# Patient Record
Sex: Female | Born: 1935 | Race: Black or African American | Hispanic: No | Marital: Married | Smoking: Never smoker
Health system: Southern US, Community
[De-identification: ages and names within clinical notes are randomized; demographics above are authoritative.]

## PROBLEM LIST (undated history)

## (undated) DIAGNOSIS — I10 Essential (primary) hypertension: Secondary | ICD-10-CM

## (undated) DIAGNOSIS — R413 Other amnesia: Secondary | ICD-10-CM

## (undated) DIAGNOSIS — H269 Unspecified cataract: Secondary | ICD-10-CM

## (undated) DIAGNOSIS — I255 Ischemic cardiomyopathy: Secondary | ICD-10-CM

## (undated) DIAGNOSIS — E162 Hypoglycemia, unspecified: Secondary | ICD-10-CM

## (undated) DIAGNOSIS — I509 Heart failure, unspecified: Secondary | ICD-10-CM

## (undated) DIAGNOSIS — E785 Hyperlipidemia, unspecified: Secondary | ICD-10-CM

## (undated) DIAGNOSIS — I519 Heart disease, unspecified: Secondary | ICD-10-CM

## (undated) DIAGNOSIS — I2510 Atherosclerotic heart disease of native coronary artery without angina pectoris: Secondary | ICD-10-CM

## (undated) DIAGNOSIS — Z9114 Patient's other noncompliance with medication regimen: Secondary | ICD-10-CM

## (undated) DIAGNOSIS — Z91148 Patient's other noncompliance with medication regimen for other reason: Secondary | ICD-10-CM

## (undated) DIAGNOSIS — R42 Dizziness and giddiness: Secondary | ICD-10-CM

## (undated) HISTORY — PX: CHOLECYSTECTOMY: SHX55

## (undated) HISTORY — DX: Dizziness and giddiness: R42

## (undated) HISTORY — DX: Atherosclerotic heart disease of native coronary artery without angina pectoris: I25.10

## (undated) HISTORY — DX: Hyperlipidemia, unspecified: E78.5

## (undated) HISTORY — DX: Unspecified cataract: H26.9

## (undated) HISTORY — DX: Essential (primary) hypertension: I10

## (undated) HISTORY — DX: Heart disease, unspecified: I51.9

---

## 1998-01-07 ENCOUNTER — Ambulatory Visit (HOSPITAL_COMMUNITY): Admission: RE | Admit: 1998-01-07 | Discharge: 1998-01-07 | Payer: Self-pay | Admitting: Internal Medicine

## 1998-01-14 ENCOUNTER — Ambulatory Visit (HOSPITAL_COMMUNITY): Admission: RE | Admit: 1998-01-14 | Discharge: 1998-01-14 | Payer: Self-pay | Admitting: Internal Medicine

## 1998-01-20 ENCOUNTER — Ambulatory Visit (HOSPITAL_COMMUNITY): Admission: RE | Admit: 1998-01-20 | Discharge: 1998-01-20 | Payer: Self-pay | Admitting: Internal Medicine

## 1998-02-01 ENCOUNTER — Ambulatory Visit (HOSPITAL_COMMUNITY): Admission: RE | Admit: 1998-02-01 | Discharge: 1998-02-01 | Payer: Self-pay | Admitting: Urology

## 1998-02-01 ENCOUNTER — Encounter: Payer: Self-pay | Admitting: Urology

## 1998-02-28 ENCOUNTER — Observation Stay (HOSPITAL_COMMUNITY): Admission: RE | Admit: 1998-02-28 | Discharge: 1998-03-01 | Payer: Self-pay | Admitting: Urology

## 1998-02-28 ENCOUNTER — Encounter: Payer: Self-pay | Admitting: Urology

## 1998-04-11 ENCOUNTER — Encounter: Payer: Self-pay | Admitting: Urology

## 1998-04-11 ENCOUNTER — Ambulatory Visit (HOSPITAL_BASED_OUTPATIENT_CLINIC_OR_DEPARTMENT_OTHER): Admission: RE | Admit: 1998-04-11 | Discharge: 1998-04-11 | Payer: Self-pay | Admitting: Urology

## 2000-01-11 ENCOUNTER — Encounter: Payer: Self-pay | Admitting: Internal Medicine

## 2000-01-11 ENCOUNTER — Encounter: Admission: RE | Admit: 2000-01-11 | Discharge: 2000-01-11 | Payer: Self-pay | Admitting: Internal Medicine

## 2002-06-02 ENCOUNTER — Encounter: Admission: RE | Admit: 2002-06-02 | Discharge: 2002-06-02 | Payer: Self-pay | Admitting: Internal Medicine

## 2002-06-02 ENCOUNTER — Encounter: Payer: Self-pay | Admitting: Internal Medicine

## 2002-06-04 ENCOUNTER — Encounter: Payer: Self-pay | Admitting: Internal Medicine

## 2002-06-04 ENCOUNTER — Encounter: Admission: RE | Admit: 2002-06-04 | Discharge: 2002-06-04 | Payer: Self-pay | Admitting: Internal Medicine

## 2002-06-23 ENCOUNTER — Encounter: Admission: RE | Admit: 2002-06-23 | Discharge: 2002-09-21 | Payer: Self-pay | Admitting: Neurological Surgery

## 2002-11-11 ENCOUNTER — Encounter: Admission: RE | Admit: 2002-11-11 | Discharge: 2003-02-09 | Payer: Self-pay | Admitting: Internal Medicine

## 2003-05-29 HISTORY — PX: CORONARY ARTERY BYPASS GRAFT: SHX141

## 2004-03-20 ENCOUNTER — Encounter: Admission: RE | Admit: 2004-03-20 | Discharge: 2004-03-20 | Payer: Self-pay | Admitting: Internal Medicine

## 2004-05-03 ENCOUNTER — Inpatient Hospital Stay (HOSPITAL_COMMUNITY): Admission: AD | Admit: 2004-05-03 | Discharge: 2004-05-14 | Payer: Self-pay | Admitting: *Deleted

## 2004-05-04 ENCOUNTER — Encounter (INDEPENDENT_AMBULATORY_CARE_PROVIDER_SITE_OTHER): Payer: Self-pay | Admitting: *Deleted

## 2004-05-04 HISTORY — PX: TRANSTHORACIC ECHOCARDIOGRAM: SHX275

## 2004-06-26 ENCOUNTER — Encounter (HOSPITAL_COMMUNITY): Admission: RE | Admit: 2004-06-26 | Discharge: 2004-09-24 | Payer: Self-pay | Admitting: *Deleted

## 2004-09-25 ENCOUNTER — Encounter (HOSPITAL_COMMUNITY): Admission: RE | Admit: 2004-09-25 | Discharge: 2004-12-24 | Payer: Self-pay | Admitting: *Deleted

## 2005-11-20 ENCOUNTER — Encounter: Admission: RE | Admit: 2005-11-20 | Discharge: 2005-11-20 | Payer: Self-pay | Admitting: Internal Medicine

## 2006-05-28 HISTORY — PX: CARDIAC CATHETERIZATION: SHX172

## 2006-12-24 HISTORY — PX: CARDIOVASCULAR STRESS TEST: SHX262

## 2007-01-23 ENCOUNTER — Encounter: Admission: RE | Admit: 2007-01-23 | Discharge: 2007-01-23 | Payer: Self-pay | Admitting: Internal Medicine

## 2007-02-11 ENCOUNTER — Inpatient Hospital Stay (HOSPITAL_BASED_OUTPATIENT_CLINIC_OR_DEPARTMENT_OTHER): Admission: RE | Admit: 2007-02-11 | Discharge: 2007-02-11 | Payer: Self-pay | Admitting: *Deleted

## 2007-10-22 ENCOUNTER — Emergency Department (HOSPITAL_COMMUNITY): Admission: EM | Admit: 2007-10-22 | Discharge: 2007-10-22 | Payer: Self-pay | Admitting: Emergency Medicine

## 2010-04-11 ENCOUNTER — Ambulatory Visit: Payer: Self-pay | Admitting: Cardiology

## 2010-06-18 ENCOUNTER — Encounter: Payer: Self-pay | Admitting: Internal Medicine

## 2010-10-10 NOTE — Cardiovascular Report (Signed)
NAME:  Mckenzie Williamson, Mckenzie Williamson             ACCOUNT NO.:  [id number]   MEDICAL RECORD NO.:  [id number]          PATIENT TYPE:  OIB   LOCATION:  1962                         FACILITY:  [facility]   PHYSICIAN:  Elmore Guise., M.D.DATE OF BIRTH:  Nov 10, 1932   DATE OF PROCEDURE:  DATE OF DISCHARGE:                            CARDIAC CATHETERIZATION   INDICATIONS FOR PROCEDURE:  Chest pain with abnormal stress test.   DESCRIPTION OF PROCEDURE:  The patient was brought to the cardiac cath  lab after appropriate informed consent.  She was prepped and draped in  sterile fashion.  Approximately 15 mL of 1% lidocaine was used for local  anesthesia.  A 4-French sheath was placed in the right femoral artery  without difficulty.  Coronary angiography, aortic root angiography, LV  angiography, LIMA angiography was then performed.  The patient tolerated  procedure well and was transferred from the cardiac cath lab in stable  condition.   FINDINGS:  1. Left Main:  Normal.  No significant disease.  2. LAD:  Ostial 70% stenosis followed by mid 60-70% stenosis followed      by competitive flow in the mid and distal LAD from LIMA graft.  3. D1:  Small vessel with ostial/proximal 80-90% stenosis and mid      distal moderate luminal irregularities.  4. LCX:  Patent with moderate proximal luminal irregularities,      subtotal occlusion after a second OM was noted.  OM-1:  High      branching small vessel with proximal 90% stenosis.  OM-2:  Moderate-      sized vessel with 60% ostial proximal stenosis followed by moderate      mid and distal luminal irregularities.  5. OM III:  Fills faintly via left-to-left collaterals as well as      distal LCX filling faintly via left-to-left wrap collaterals.  6. Ramus intermedius:  Small vessel with diffuse proximal disease.  7. RCA:  Dominant with proximal 60% stenosis followed by a subtotal      mid obstruction, distal and mid vessel fills via right to right  collaterals as well as left to right collaterals.  8. Vein graft to the OM system is include occluded.  9. Vein graft to the distal RCA is patent occluded.  10.LIMA to the LAD was selectively engaged and patent without      significant disease in the body and touchdown of the LIMA.  There      is back flow noted filling the proximal LAD.  The distal LAD has an      80% stenosis prior to bifurcation into a fish tail.  However, very      small vessel with TIMI III flow was noted.  11.LV:  EF is 50-55%.  LVEDP is 16 mmHg.  No significant wall motion      abnormalities.  12.Aortic root angiogram showed mild proximal ectasia of the proximal      ascending aorta (no dissection) with no patent graft vein grafts      noted.   IMPRESSION:  1. Multivessel coronary artery disease as discussed above.  2. Occluded  vein graft to the OM and occluded vein graft to the RCA.  3. Patent LIMA to LAD.  4. Normal LV systolic function with an EF of 50-55%.  5. Mild proximal aortic root ectasia with no evidence of dissection      and no patent vein graft visualized.   PLAN:  At this time, I would recommend aggressive maximal medical  therapy.  The patient will have Ranexa and Plavix added to her current  regimen of beta blocker, ACE inhibitor, statin and nitrates.      Elmore Guise., M.D.  Electronically Signed     TWK/MEDQ  D:  02/11/2007  T:  02/11/2007  Job:  [id number]

## 2010-10-13 NOTE — H&P (Signed)
NAME:  Mckenzie Williamson, Mckenzie Williamson NO.:  [id number]   MEDICAL RECORD NO.:  [id number]          PATIENT TYPE:  INP   LOCATION:                               FACILITY:  [facility]   PHYSICIAN:  Elmore Guise., M.D.DATE OF BIRTH:  [birth year]   DATE OF ADMISSION:  05/03/2004  DATE OF DISCHARGE:                                HISTORY & PHYSICAL   PRIMARY CARE PHYSICIAN:  Dr. Wilson Singer.   INDICATION FOR ADMISSION:  Unstable angina.   HISTORY OF PRESENT ILLNESS:  The patient is a very pleasant 77 year old  African American female with past medical history of hypertension, diabetes,  dyslipidemia, who presents for evaluation of increasing substernal chest  pressure.  The patient reports a 100-month history of substernal chest pain  off and on, however, noticed increasing intensity as well as increasing  frequency over the last couple of weeks.  She had a recurrent bout last  evening starting with any exertional activity.  She states those are  associated with profound sweating and shortness of breath.  Last evening's  episode, she took 2 aspirin.  Chest pain continued for approximately  ______________  lay down with resolution of her symptoms.  She had a  recurrent episode this morning, went to her primary care physician's office  and is sent here for further evaluation.  Otherwise, reports occasional  chills, occasional right leg tingling, otherwise, no significant complaints.  Reports her weight is stable.  Denies any orthopnea, PND or lower extremity  edema.  Does have occasional palpitations primarily associated with her  chest discomfort.  ____________ and working out at [employer], however, stopped  approximately 2 months ago.  All other review of systems are negative.   CURRENT MEDICATIONS:  1.  Aspirin.  2.  Accupril 20 mg daily.   ALLERGIES:  None.   FAMILY HISTORY:  Family history is positive for TB in her mother, lung  cancer in her father, heart attack in her  brother at age 68.   SOCIAL HISTORY:  She is married, has 3 grown children, all healthy.  She is  a Merchandiser, retail at the [employer], now retired.  No tobacco,  occasional alcohol, drinks approximately 1 caffeinated beverage daily.   PAST SURGICAL HISTORY:  Past surgical history includes cholecystectomy.   PHYSICAL EXAMINATION:  VITAL SIGNS:  On exam today, blood pressure is  128/80, heart rate is 104 and regular, weight is 203 pounds.  GENERAL:  In general, she is a very pleasant African American female, alert  and oriented x4, in no acute distress.  NECK:  Neck is supple with no lymphadenopathy, 2+ carotids, no bruits.  LUNGS:  Lungs are clear with decreased breath sounds in the bases.  HEART:  Heart is regular with a normal S1 and S2, a very soft 2/6 systolic  ejection murmur heard at the left lower sternal border.  ABDOMEN:  Abdomen is obese, soft, nontender and non-distended.  EXTREMITIES:  Extremities are warm with trace pedal pulses and 1+ femoral  pulses, no bruit noted.   LABORATORY AND ACCESSORY CLINICAL DATA:  ECG  today shows sinus tachycardia,  nonspecific T wave flattening in the lateral precordial leads, small Q waves  noted inferiorly.   IMPRESSION:  1.  Unstable angina.  2.  Diabetes mellitus.  3.  History of hypertension and dyslipidemia.   PLAN:  1.  Will admit to telemetry.  2.  Start anticoagulation with Lovenox 1 mg/kg b.i.d.  3.  Continue aspirin.  4.  Will start low-dose Lopressor 12.5 mg b.i.d. as well as nitroglycerin      with nitro paste 1/2 ____________.  5.  Will obtain lab work including CBC, CMP, cardiac enzymes, chest x-ray      for further evaluation.  Will obtain fasting lipid panel in the morning.  6.  Discussed invasive workup including cardiac catheterization and the      patient agrees to proceed, catheterization tentatively scheduled for      May 04, 2004 at 7:30.  7.  Further treatment pending her blood work  results.        ___________________________________________  Elmore Guise., M.D.    TWK/MEDQ  D:  05/03/2004  T:  05/03/2004  Job:  [id number]

## 2010-10-13 NOTE — Consult Note (Signed)
NAME:  Mckenzie Williamson, Mckenzie Williamson NO.:  [id number]   MEDICAL RECORD NO.:  [id number]          PATIENT TYPE:  INP   LOCATION:  3707                         FACILITY:  [facility]   PHYSICIAN:  Kathlee Nations Trigt III, M.D.DATE OF BIRTH:  1932-09-02   DATE OF PROCEDURE:  05/04/2004  DATE OF DISCHARGE:                      STAT - MUST CHANGE TO CORRECT WORK TYPE   REASON FOR CONSULTATION:  Severe three vessel coronary artery disease,  unstable angina.   CHIEF COMPLAINT:  Chest pain and shortness of breath.   HISTORY OF PRESENT ILLNESS:  I was asked to evaluate this 77 year old obese  diabetic female for potential surgical coronary revascularization for  recently diagnosed severe three vessel coronary artery disease.  She has had  an eight week history of exertional substernal chest pain of increasing  intensity and increasing frequency over the past several days.  On the  evening prior to admission, she had some nocturnal angina without preceding  activity.  This was associated with diaphoresis, shortness of breath, and  some nausea.  She took some aspirin and rested and the pain resolved.  However, the next morning, she had recurrent, similar chest pain and for  that reason, she was referred for admission and cardiology evaluation.  The  patient had cardiac enzymes which were negative and nonspecific ST segment  changes on her EKG.  Diagnostic cardiac catheterization was done today which  demonstrated severe three vessel disease with 80-90% stenosis of the LAD,  80% stenosis of the circumflex, and diffuse 70-80% stenosis of the right  coronary.  Ejection fraction was globally decreased at approximately 40% and  left ventricular end diastolic pressure was 18 mmHg.  A 2D echo was  performed which showed no significant mitral regurgitation and ejection  fraction of 40% with left ventricular hypertrophy consistent with her  clinical history of hypertension.  Because of her coronary anatomy  and  symptoms of unstable angina, she was felt to be a candidate for surgical  revascularization.   PAST MEDICAL HISTORY:  1.  Diabetes mellitus.  2.  Hypertension.  3.  Dyslipidemia.  4.  Obesity.  5.  Status post cholecystectomy.  6.  No known drug allergies.   HOME MEDICATIONS:  Lantus insulin 70 units q.h.s., Accupril 20 mg p.o.  daily, aspirin 325 mg daily, Lopressor 25 mg b.i.d. and p.r.n.  nitroglycerin.   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  She  is married and has grown children.  She does not use alcohol or tobacco.   FAMILY HISTORY:  Positive for coronary artery disease in that both a sister  and brother have had heart disease or MI.  Her father had lung cancer and  her mother had tuberculosis.   REVIEW OF SYMPTOMS:  General review is negative for weight loss of fever.  HEENT:  Negative for swallowing difficulty or active dental complaints.  Thoracic review is negative for thoracic trauma, productive cough, or  history of abnormal chest x-ray.  Cardiac review is positive for unstable  angina, negative for prior cardiac murmur.  GI review is positive for  previous cholecystectomy,  no recent change in bowel habits or blood per  rectum.  Urological review is negative for kidney stones or voiding  difficulty.  Vascular review is negative for claudication, TIA, or DVT.  Endocrine review is positive for diabetes, negative for thyroid disease.  Hematologic review is negative for bleeding disorder or prior blood  transfusions.  Neurological review is negative for stroke or seizure.   PHYSICAL EXAMINATION:  VITAL SIGNS:  The patient is 5 feet 2 inches, weight 204 pounds, blood  pressure 130/84, heart rate 90, respirations 18, saturations 95%.  GENERAL:  Short, obese white female in her hospital room following cardiac  catheterization in no acute distress.  HEENT:  Normocephalic, full EOMs, pharynx clear.  NECK:  Supple without JVD, mass,  or carotid bruits.  LYMPHATICS:  Reveal no palpable supraclavicular adenopathy.  LUNGS:  Clear with somewhat distant breath sounds bilaterally.  CARDIAC EXAM:  Regular rhythm without S3 gallop or S4 heart sound.  She had  a soft 2/6 holosystolic murmur.  ABDOMEN:  Obese, nontender, without organomegaly.  EXTREMITIES:  No clubbing, edema, or cyanosis.  VASCULAR EXAM:  Nonpalpable pedal pulses, 2+ femoral and radial pulses  bilaterally, there is no evidence of venous insufficiency of the lower  extremities.  SKIN:  Without rash or lesions.  NEUROLOGIC:  Alert and oriented x 3 without focal motor deficit.   IMPRESSION:  I reviewed the coronary arteriograms and 2D echo and she has  severe diabetic three vessel disease with moderately reduced LV function and  significant left ventricular  hypertrophy.  There is no evidence of  significant mitral regurgitation or aortic insufficiency.   PLAN:  Surgical revascularization would be recommended to this patient for  her best long term therapy of her three vessel coronary disease.  She would  benefit from bypass grafts to the LAD, circumflex marginal, and posterior  descending artery.  I have discussed the plan for surgery and she is in  agreement at this time.  We will proceed with preparations and schedule the  surgery for Monday, December 12.  Thank you for the consultation.      Mckenzie Williamson   PV/MEDQ  D:  05/04/2004  T:  05/04/2004  Job:  [id number]   cc:   Elmore Guise., M.D.  [address]  [place], [place] [address]  Fax: [phone]   Wilson Singer, M.D.  [address]  [place]  [place] [address]  Fax: [phone]

## 2010-10-13 NOTE — Discharge Summary (Signed)
NAME:  Mckenzie Williamson, YOUSE NO.:  [id number]   MEDICAL RECORD NO.:  [id number]          PATIENT TYPE:  INP   LOCATION:  2014                         FACILITY:  [facility]   PHYSICIAN:  [name], M.D.  DATE OF BIRTH:  [birth year]   DATE OF ADMISSION:  05/03/2004  DATE OF DISCHARGE:  05/14/2004                                 DISCHARGE SUMMARY   ADMISSION DIAGNOSIS:  Chest pain.   ADDITIONAL/DISCHARGE DIAGNOSES:  1.  Severe three-vessel coronary artery disease and class 4 unstable angina,      status post coronary artery bypass graft surgery x3, completed on      May 08, 2004.  2.  Left ventricular dysfunction with an ejection fraction of 40%.  3.  Diabetes mellitus.  4.  Hypertension.  5.  Dyslipidemia.  6.  Obesity.  7.  History of a cholecystectomy.   HOSPITAL MANAGEMENT/PROCEDURES:  1.  A cardiac catheterization on May 04, 2004.  This revealed severe      three-vessel coronary artery disease with a mild left ventricular      dysfunction with an estimated ejection fraction of 40%.  2.  Preliminary arterial evaluation for planned cardiac surgery including      bilateral carotid duplex examination.  This revealed no significant      internal carotid artery stenosis bilaterally.  3.  Coronary artery bypass graft surgery x3 completed on May 08, 2004.  4.  Initiation of cardiac rehabilitation phase I.  5.  Postoperative blood transfusion on May 09, 2004.  The patient had      no adverse reaction.   CONSULTATIONS:  1.  Cardiac rehabilitation.  2.  Case management.   HISTORY OF PRESENT ILLNESS:  The patient is a pleasant 77 year old obese,  diabetic, hypertensive black female, who presented to [facility] on May 03, 2004, with unstable angina.  The patient had cardiac  enzymes which were negative, and nonspecific ST-segment changes on her  electrocardiogram.  A decision was made for her to be admitted for further  evaluation.   HOSPITAL COURSE:  The patient was admitted to [facility]  on May 03, 2004.  She was taken to the cardiac catheterization and  underwent a coronary arteriography.  This revealed severe three-vessel  coronary artery disease with 80%-90% stenosis of the LAD, 80% stenosis of  the circumflex, and diffuse 70%-80% stenosis of the right coronary artery.  The patient's ejection fraction was globally decreased at approximately 40%,  and left ventricular end diastolic pressure was 18 mmHg.  A 2-D  echocardiogram was performed which showed no significant mitral  regurgitation, and an ejection fraction of 40%.  Because of her coronary  anatomy and symptoms of unstable angina, the patient was felt to be a  candidate for surgical revascularization.  Dr. Kathlee Nations Trigt of [facility] responded to the surgical consultation request  on May 04, 2004.  His impression was that indeed the patient would  benefit from a coronary artery bypass grafting for her best long-term  therapy of her three-vessel coronary artery disease.  He discussed the plan  for surgery, as well as the risks, benefits and alternatives to the  procedure with the patient and with her family at that time.  The patient  was in understanding, and agreed to proceed with surgery.  The plan was made  for preparations and the surgery was scheduled for Monday, May 08, 2004.  In the meantime, the patient remained hemodynamically stable and free of  chest pain.  She remained on aspirin, ACE inhibitor, Lopressor and heparin.  Preliminary arterial evaluation was completed, and showed no evidence of  significant internal carotid artery stenosis.  The patient was taken to the operating room on May 08, 2004, and  underwent a coronary artery bypass grafting x3 utilizing the left internal  mammary artery to the LAD, saphenous vein graft to the obtuse marginal,  saphenous vein graft to the posterior descending  coronary artery.  The  greater saphenous vein was harvested using an open technique from the right  thigh, although this was unsuitable for a conduit.  The left lower leg vein  was exposed, but was too small to use for a conduit as well.  Therefore,  cryo-vein was used for the lesser conduit.  Overall, the patient tolerated  this procedure well and was transferred to the surgical intensive care unit  in critical but stable condition.  Postoperatively the patient was extubated without difficulty on  postoperatively day number one.  She awoke from anesthesia neurologically  intact.  She remained on a low-dose Neo-Synephrine drip for pressure  support.  The patient was initiated on Plavix therapy for coverage of the  cryo-vein conduit.  The patient continued to progress through her  postoperative course without any significant complications.  Her invasive  lines and chest tubes were discontinued while she was in the intensive care  unit.  She was deemed appropriate for transfer to the step-down unit on  postoperatively day number two, or May 11, 2004.  While on the step-down unit, the patient continued to do quite well.  She  did have minimal serosanguineous drainage from the proximal incision on her  right thigh.  The patient was initiated on oral antibiotic therapy for  coverage.  Otherwise, the patient was deemed appropriate for the initiation  of discharge planning on May 13, 2004, or postoperative day number  four.   CONDITION ON DISCHARGE:  At the time of discharge planning, the patient was  doing quite well.  She was ambulating in the hallways with cardiac rehab  utilizing a rolling walker.  Her vital signs were as follows:  She remained  afebrile, blood pressure 140/70, pulse 86 and regular, respirations 20 and  unlabored.  SAO2 was 94%-99% on room air.  The patient's weight was at 206  pounds with a preoperative weight of 202 pounds.  The  24-hour bedside glucose  measurements were as follows:  71, 195, 95 and 122.  On her physical examination her heart was in a regular rate and rhythm with  a normal sinus rhythm on telemetry.  Her lungs were clear to auscultation.  Her abdomen was soft, nontender, nondistended, with good bowel sounds.  Her  extremities had 1+ ankle edema bilaterally.  Her incisions showed mild  serosanguineous drainage from the proximal right thigh incision, otherwise  were healing well.   DISPOSITION:  We will continue as planned with discharge in the a.m. of  May 14, 2004, pending the a.m. rounds and no change in the patient's  clinical status.  The patient is being discharged  to home in an improved and  stable condition.   DISCHARGE MEDICATIONS:  1.  Aspirin 81 mg p.o. daily.  2.  Lopressor 25 mg p.o. b.i.d.  3.  Accupril 20 mg p.o. daily.  4.  Lipitor 20 mg p.o. daily.  5.  Plavix 75 mg p.o. daily.  6.  Lantus insulin 40 units subcutaneously q.h.s.  7.  Keflex 500 mg p.o. q.6h. for seven more days.  8.  Multivitamin p.o. daily.  9.  Lasix 40 mg p.o. daily for seven more days.  10. K-Dur 20 mEq p.o. daily for seven more days.  11. Ultram 50 mg, one to two tab p.o. q.4-6h. p.r.n. pain.   DISCHARGE INSTRUCTIONS:  1.  Activity:  The patient is to avoid driving.  She should avoid heavy      lifting or strenuous activity.  She is to continue to walk daily.  She      should continue her breathing exercises for one more week.  2.  Diet:  The patient is to follow a low-fat, low-salt, carbohydrate-      modified medium calorie diet.  3.  Wound care:  The patient may shower.  She should wash her incisions      daily with soap and water.  She should notify the [facility] if she has      any increased redness, swelling, or drainage from her incisions, or if      she has a fever of greater than 101.0 degrees.  4.  Special instructions:  The patient will have a home health registered      nurse, as well as a physical therapist  visit her per cardiac surgery      protocol.  This is arranged by [facility].   FOLLOWUP:  1.  The patient is scheduled for staple removal at the [facility] on      May 23, 2004, at 10 .m.  2.  The patient should schedule an appointment to see Dr. Rosine Abe      within two weeks of discharge.  The patient is to call #[phone] to      make this appointment date and time.  3.  The patient is scheduled to see Dr. Kathlee Nations Trigt on Friday, June 30, 2004, at 10:15 a.m.      Eber Jones   CAF/MEDQ  D:  05/13/2004  T:  05/15/2004  Job:  [id number]   cc:   Dr. Domingo Pulse., M.D.  [address]  [place], [place] [address]  Fax: [phone]

## 2010-10-13 NOTE — Cardiovascular Report (Signed)
NAME:  Mckenzie Williamson, Mckenzie Williamson NO.:  [id number]   MEDICAL RECORD NO.:  [id number]          PATIENT TYPE:  INP   LOCATION:  3707                         FACILITY:  [facility]   PHYSICIAN:  Elmore Guise., M.D.DATE OF BIRTH:  21-Jan-1933   DATE OF PROCEDURE:  05/04/2004  DATE OF DISCHARGE:                              CARDIAC CATHETERIZATION   INDICATIONS FOR PROCEDURE:  Unstable angina.   BRIEF HISTORY:  A 77 year old African-American female with a past medical  history of diabetes mellitus, dyslipidemia and hypertension presented with  two-month history of progressive angina.   PROCEDURE:  Cardiac catheterization, coronary angiogram, LV angiogram.   PROCEDURE DESCRIPTION:  The patient was brought to the Cardiac Cath Lab  after appropriate informed consent.  She was prepped and draped in a sterile  fashion.  A 6 French sheath was placed in the right femoral artery without  difficulty, a 5 French sheath was placed in the right femoral vein.  Angiography was then performed on the left and right coronary systems.  This  was followed by LV angiography.  The patient tolerated the procedure well  and was transferred from the Cardiac Cath Lab in stable condition.   RESULTS:  1.  Left main:  Mild distal narrowing.  2.  LAD:  Ostial 70-80% stenosis with mid 40-50% stenosis and distal 50%      stenosis.  LAD giving left-to-right collaterals to distal RCA and PLV      branch.  Small branching diagonals were noted with moderate disease.  3.  LCX:  Mid 40-50% stenosis with large branching OM prior to a 100%      obstruction.  4.  RCA:  Dominant with diffuse disease, subtotal mid obstruction, there are      faint right-to-right collaterals as well as left-to-right collaterals      still in the posterolateral branch.  5.  LVEFe50% with mild diaphragmatic hypokinesis, LVEDP was 18 mmHg.   IMPRESSION:  1.  Severe obstructive three-vessel disease.  2.  Preserved left ventricular  systolic function with mild diaphragmatic      hypokinesis and LVEDP of 18 mmHg.   PLAN:  Surgical consult for revascularization.       TWK/MEDQ  D:  05/04/2004  T:  05/04/2004  Job:  [id number]

## 2010-10-13 NOTE — Op Note (Signed)
NAME:  Mckenzie Williamson, Mckenzie Williamson             ACCOUNT NO.:  [id number]   MEDICAL RECORD NO.:  [id number]          PATIENT TYPE:  INP   LOCATION:  2308                         FACILITY:  [facility]   PHYSICIAN:  Kathlee Nations Trigt III, M.D.DATE OF BIRTH:  [birth year]   DATE OF PROCEDURE:  05/08/2004  DATE OF DISCHARGE:                                 OPERATIVE REPORT   OPERATION:  Coronary artery bypass grafting x3 (left internal mammary artery  to left anterior descending artery, saphenous vein graft to obtuse marginal,  saphenous vein graft to posterior descending).   PREOPERATIVE DIAGNOSIS:  Class 4 unstable angina with severe two-vessel  coronary artery disease, inferior wall hypokinesia.   POSTOPERATIVE DIAGNOSIS:  Class 4 unstable angina with severe two-vessel  coronary artery disease, inferior wall hypokinesia.   SURGEON:  [name], M.D.   ASSESSMENT:  [name], P.A.-C.   ANESTHESIA:  General.   INDICATIONS:  The patient is a 77 year old obese diabetic, hypertensive  black female who presented with unstable angina.  She ruled out for MI and  cardiac catheterization by Dr. [name] demonstrated severe three-vessel  coronary disease with inferior wall hypokinesia.  She is felt to be a  candidate for surgical revascularization based on the diabetic pattern of  her coronary artery disease, which is not felt to be amenable to  percutaneous intervention.   Prior to surgery, I examined the patient in her hospital room and reviewed  the results of the cardiac catheterization with the patient and her family.  I discussed the indications and expected benefits of coronary artery bypass  surgery for treatment of her coronary artery disease.  I discussed with them  the major aspects of the planned procedure, including the choice of conduit  for grafts, the use of general anesthesia and cardiopulmonary bypass, the  location of the surgical incisions, and the expected postoperative  hospital  recovery period.  I discussed with the patient the risks to her of coronary  artery bypass surgery, including the risks of MI, CVA, bleeding, blood  transfusion requirement, infection, and death.  She understood these  implications for the surgery and agreed to proceed with the operation as  planned under what I felt was an informed consent.   OPERATIVE FINDINGS:  The patient's saphenous vein was of very poor quality  and inadequate for use in either leg.  Both legs were explored, and an open  harvest of the right thigh vein was performed to only find this heavily  scarred, small, sclerotic and not usable with evidence of probable prior  phlebitis.  For that reason, cryopreserved human saphenous vein was used,  and the blood group of the donor vein was the same blood group of the  patient recipient.  The mammary artery was a good conduit, approximately 1.5  mm in diameter.  The patient received 3 units of packed cells in the  operating room for preoperative anemia.   PROCEDURE:  The patient was brought to the operating room and placed supine  on the operating room table, where general anesthesia was introduced under  invasive hemodynamic monitoring.  The  chest, abdomen, and legs were prepped  with Betadine and draped as a sterile field.  The incisions were first made  in both legs to perform endoscopic harvest; however, no good vein or  adequate vein could be identified through the small incisions.  An  additional incision was made in each leg; however, no vein was identified.  For that reason, a right groin incision was made, and the greater saphenous  vein and the accessory saphenous vein were identified and dissected out of  her extremely deep thighs with several centimeters of subcutaneous fat.  The  vein was filed down to the knee, where it became too small to use and was  divided.  The vein was then cannulated and flushed and found to be scarred,  thickened, nonpatent in  some areas, and not usable as a conduit.  The leg  incision was then irrigated.  Hemostasis was achieved, and it was closed  with some interrupted deep Vicryl sutures.  The decision was made to use  cryopreserved vein after over two hours were spent trying to find native  vein, which was not adequate for use as a conduit.   After regowning and gloving, a sternal incision was made.  The pericardium  was opened.  The mammary artery retractors were placed, and the mammary  artery was dissected off the chest wall as a pedicle graft from its origin  at the subclavian vessels.  It was 1.5 mm in diameter, and it was a good  vessel with adequate flow.  Heparin was administered.  The ACT was  documented as being therapeutic.  The sternal retractor was placed, and the  pericardium was suspended.  The purse-string was placed in the ascending  aorta and right atrium.  The patient was cannulated, placed on bypass and  cooled to 32 degrees.  The coronaries were identified for grafting, and the  mammary artery and cryo-preserved vein grafts were prepared for the distal  anastomoses.  A cardioplegic cannula was placed in the ascending aorta and  into the coronary sinus for both antegrade and retrograde delivery of cold-  blood cardioplegia.  The patient was cooled to 32 degrees, and the aortic  cross clamp was applied.  Then 800 cc of cold blood cardioplegia was  delivered in split doses between the antegrade aortic and retrograde  coronary sinus catheters.  There was good cardioplegic arrest and septal  temperature dropped to less than 12 degrees.  Topical iced saline was used  to augment myocardial preservation, and a pericardial insulator pad was used  to protect the left phrenic nerve.   The distal coronary anastomosis were then performed.  The first distal  anastomosis was to the posterior descending.  This was a nice 1.5 mm vessel with total proximal occlusion.  A reverse saphenous vein was sewn  end-to-  side with running 7-0 Prolene.  There was good flow through the graft.  The  second distal anastomosis was to the obtuse marginal.  This was  intramyocardial and had high-grade proximal stenosis.  The reverse saphenous  vein was sewn end-to-side with running 7-0 Prolene.  There was excellent  flow through the graft.  Cardioplegia was redosed to the vein grafts.  The  third distal anastomosis was the distal aspect of the LAD.  It had diffuse  calcified proximal stenosis.  The left IMA pedicle was brought through an  opening created in the left lateral pericardium.  It was brought down onto  the LAD and sewn  end-to-side with running 8-0 Prolene.  There was excellent  flow through the anastomosis after briefly releasing the vascular bulldog  clamp on the mammary pedicle, which was then reapplied.  The pedicle was  secured to epicardium.  Cardioplegia was redosed.   While the cross clamp was still in place, two proximal vein anastomoses were  placed on the ascending aorta using 4.0 mm punch and running 6-0 Prolene.  The aortic cross clamp was removed after air was vented from the coronaries  on the left side of the heart using a dose of warm-blood retrograde  cardioplegia.  The aortic cross clamp was then removed after the proximal  anastomosis had been tied.   The heart was cardioverted back to a regular rhythm.  The patient was  rewarmed at 37 degrees.  The grafts were examined and found to have good  flow, and hemostasis was documented at the proximal and distal anastomoses.  The patient was rewarmed, and temporary pacing wires were applied.  The  lungs were expanded, and ventilator was resumed.  The patient was then  weaned from bypass without difficulty with stable blood pressure and cardiac  output.  Protamine was administered without adverse reaction.  The cannula  was removed.  The mediastinum was irrigated with a warm antibiotic  irrigation.  The mediastinum was irrigated  with warm antibiotic irrigation.  The leg incision was irrigated and closed in a standard fashion.  The  superior pericardial fat was closed over the aorta and vein grafts.  Two  mediastinal and left pleural chest tubes were placed and then brought out  through separate incisions.  The  sternum was closed with interrupted steel wire.  The pectoralis fascia was  closed with a running #1 Vicryl.  The subcutaneous and skin layers were  closed with a running Vicryl, and sterile dressings were applied.  Total  bypass time was 115 minutes with cross clamp time performed at both distal  and proximal anastomoses of 70 minutes.      [name]   PV/MEDQ  D:  05/08/2004  T:  05/08/2004  Job:  [id number]   cc:   Elmore Guise., M.D.  [address]  [place], [place] [address]  Fax: [phone]

## 2011-02-08 ENCOUNTER — Encounter: Payer: Self-pay | Admitting: Cardiology

## 2011-02-09 ENCOUNTER — Other Ambulatory Visit: Payer: Self-pay | Admitting: Cardiology

## 2011-02-09 MED ORDER — CLOPIDOGREL BISULFATE 75 MG PO TABS: 75.0000 mg | ORAL_TABLET | Freq: Every day | ORAL | Status: DC

## 2011-02-09 MED ORDER — PRAVASTATIN SODIUM 40 MG PO TABS: 40.0000 mg | ORAL_TABLET | Freq: Every day | ORAL | Status: DC

## 2011-02-09 MED ORDER — CARVEDILOL 12.5 MG PO TABS: 12.5000 mg | ORAL_TABLET | Freq: Two times a day (BID) | ORAL | Status: DC

## 2011-02-09 MED ORDER — OLMESARTAN MEDOXOMIL 40 MG PO TABS: 40.0000 mg | ORAL_TABLET | Freq: Every day | ORAL | Status: DC

## 2011-02-09 MED ORDER — FENOFIBRATE 150 MG PO CAPS: 150.0000 mg | ORAL_CAPSULE | Freq: Every day | ORAL | Status: DC

## 2011-02-09 NOTE — Telephone Encounter (Signed)
Pt states she needs a refill on her medication.  She is not sure what the names are.  She states she uses the pharmacy in [place] on [address].  Pt would also like medical advice about what she should do until her next appt on Friday 9/21.

## 2011-02-09 NOTE — Telephone Encounter (Signed)
Called stating she has been out of her medications for about 6 months. Is scheduled to see Dr. [place] 9/25. Does not know what exactly she is on, does not have bottles either. Refilled meds from list in paper chart. Advised to bring meds when comes in on 9/25

## 2011-02-13 ENCOUNTER — Telehealth: Payer: Self-pay | Admitting: *Deleted

## 2011-02-13 NOTE — Telephone Encounter (Signed)
[facility] called stating Fenofibrate does not come in 150 mg; is 160 mg. Advised to change dose to 160 mg

## 2011-02-16 ENCOUNTER — Encounter: Payer: Self-pay | Admitting: Cardiology

## 2011-02-16 ENCOUNTER — Ambulatory Visit (INDEPENDENT_AMBULATORY_CARE_PROVIDER_SITE_OTHER): Payer: Medicare Other | Admitting: Cardiology

## 2011-02-16 DIAGNOSIS — E785 Hyperlipidemia, unspecified: Secondary | ICD-10-CM

## 2011-02-16 DIAGNOSIS — I10 Essential (primary) hypertension: Secondary | ICD-10-CM

## 2011-02-16 DIAGNOSIS — I2510 Atherosclerotic heart disease of native coronary artery without angina pectoris: Secondary | ICD-10-CM

## 2011-02-16 DIAGNOSIS — E119 Type 2 diabetes mellitus without complications: Secondary | ICD-10-CM

## 2011-02-16 MED ORDER — NITROGLYCERIN 0.4 MG SL SUBL: 0.4000 mg | SUBLINGUAL_TABLET | SUBLINGUAL | Status: DC | PRN

## 2011-02-16 MED ORDER — PRAVASTATIN SODIUM 40 MG PO TABS: 40.0000 mg | ORAL_TABLET | Freq: Every day | ORAL | Status: DC

## 2011-02-16 MED ORDER — CARVEDILOL 12.5 MG PO TABS: 12.5000 mg | ORAL_TABLET | Freq: Two times a day (BID) | ORAL | Status: DC

## 2011-02-16 NOTE — Patient Instructions (Signed)
Continue ASA.  We will start you on carvedilol 12.5 mg twice a day for blood pressure and Pravastatin 40 mg daily for high cholesterol.  I will send you a prescription for NTG to use under your tongue for chest pain.  I recommend you call [facility] to make an appointment for a medical evaluation. # [phone].  I will have you return in 1 month for fasting lab work and visit.

## 2011-02-17 ENCOUNTER — Encounter: Payer: Self-pay | Admitting: Cardiology

## 2011-02-17 DIAGNOSIS — E119 Type 2 diabetes mellitus without complications: Secondary | ICD-10-CM | POA: Insufficient documentation

## 2011-02-17 DIAGNOSIS — I2510 Atherosclerotic heart disease of native coronary artery without angina pectoris: Secondary | ICD-10-CM | POA: Insufficient documentation

## 2011-02-17 DIAGNOSIS — I10 Essential (primary) hypertension: Secondary | ICD-10-CM | POA: Insufficient documentation

## 2011-02-17 DIAGNOSIS — E785 Hyperlipidemia, unspecified: Secondary | ICD-10-CM | POA: Insufficient documentation

## 2011-02-17 NOTE — Assessment & Plan Note (Signed)
She is having some exertional anginal symptoms. She has been noncompliant with her medications. We will continue with aspirin daily. We will give her carvedilol 12.5 mg twice a day. I renewed her prescription for nitroglycerin. I will see her back in one month.

## 2011-02-17 NOTE — Assessment & Plan Note (Signed)
I stressed importance of compliance with medical therapy. Since she has financial issues we will refer her to [facility] clinic.

## 2011-02-17 NOTE — Assessment & Plan Note (Signed)
Operative a prescription for pravastatin 40 mg daily. We will followup chemistries and a lipid panel in one month.

## 2011-02-17 NOTE — Progress Notes (Signed)
Mckenzie Williamson Date of Birth: 20-Sep-1932   History of Present Illness: Mckenzie Williamson is seen today for followup. She has a history of coronary disease and is status post CABG in December 2005. This included saphenous vein graft to the PDA, saphenous vein graft to obtuse marginal vessel and an LIMA graft to LAD. Cardiac catheterization in 2008 showed occlusion of the vein grafts. She has been managed medically. Since her last visit she quit taking all of her medications stating that she couldn't afford them. This despite the fact that on her last visit we had called in prescriptions for the $4 [employer] plan. She has had no medical followup. She does complain of occasional mid substernal chest rash or with activity which she takes aspirin and is resolved within a few minutes with rest.  Current Outpatient Prescriptions on File Prior to Visit  Medication Sig Dispense Refill  . aspirin 325 MG tablet Take 325 mg by mouth daily.        . clopidogrel (PLAVIX) 75 MG tablet Take 1 tablet (75 mg total) by mouth daily.  30 tablet  5  . fenofibrate 160 MG tablet Take 160 mg by mouth daily.        . Multiple Vitamin (MULTIVITAMIN) tablet Take 1 tablet by mouth daily.        Marland Kitchen olmesartan (BENICAR) 40 MG tablet Take 1 tablet (40 mg total) by mouth daily.  30 tablet  5    No Known Allergies  Past Medical History  Diagnosis Date  . Coronary artery disease   . Diabetes mellitus   . Hyperlipidemia   . Hypertension     Past Surgical History  Procedure Date  . Coronary artery bypass graft 2005    THIS INCLUDED SAPHENOUS VEIN GRAFT TO THE PDA, SAPHENOUS VEIN GRAFT TO OBTUSE MARGINAL VESSEL, AS LIMA GRAFT TO THE LAD.  Marland Kitchen Cardiac catheterization 2008    EF 50-55%. SHOWED OCCLUSION OF VEIN GRAFT  . Cholecystectomy   . Transthoracic echocardiogram 05/04/2004    EF 50-55%  . Cardiovascular stress test 12/24/2006    EF 47%    History  Smoking status  . Never Smoker   Smokeless tobacco  . Not  on file    History  Alcohol Use No    Family History  Problem Relation Age of Onset  . Tuberculosis Mother   . Lung cancer Father   . Diabetes Sister   . Heart attack Brother   . Hypertension Brother   . Diabetes Brother     Review of Systems: The review of systems is positive for noncompliance.  All other systems were reviewed and are negative.  Physical Exam: BP 154/78  Pulse 78  Ht 5\' 2"  (1.575 m)  Wt 167 lb (75.751 kg)  BMI 30.54 kg/m2 She is an elderly black female in no acute distress.The patient is alert and oriented x 3.  The mood and affect are normal.  The skin is warm and dry.  Color is normal.  The HEENT exam reveals that the sclera are nonicteric.  The mucous membranes are moist.  The carotids are 2+ without bruits.  There is no thyromegaly.  There is no JVD.  The lungs are clear.  The chest wall is non tender.  The heart exam reveals a regular rate with a normal S1 and S2.  There are no murmurs, gallops, or rubs.  The PMI is not displaced.   Abdominal exam reveals good bowel sounds.  There is no  guarding or rebound.  There is no hepatosplenomegaly or tenderness.  There are no masses.  Exam of the legs reveal no clubbing, cyanosis, or edema.  The legs are without rashes.  The distal pulses are intact.  Cranial nerves II - XII are intact.  Motor and sensory functions are intact.  The gait is normal.  LABORATORY DATA:   Assessment / Plan:

## 2011-02-21 LAB — PROTIME-INR
INR: 0.9
Prothrombin Time: 12.7

## 2011-02-21 LAB — CBC
HCT: 37.9
Hemoglobin: 12.6
MCHC: 33.1
MCV: 86.3
Platelets: 281
RBC: 4.39
RDW: 13.5
WBC: 6.1

## 2011-02-21 LAB — DIFFERENTIAL
Basophils Absolute: 0.1
Basophils Relative: 2 — ABNORMAL HIGH
Eosinophils Absolute: 0.1
Eosinophils Relative: 2
Lymphocytes Relative: 43
Lymphs Abs: 2.6
Monocytes Absolute: 0.1
Monocytes Relative: 2 — ABNORMAL LOW
Neutro Abs: 3.1
Neutrophils Relative %: 52

## 2011-02-21 LAB — COMPREHENSIVE METABOLIC PANEL
ALT: 23
AST: 13
Albumin: 3.8
Alkaline Phosphatase: 78
BUN: 14
CO2: 24
Calcium: 10.1
Chloride: 105
Creatinine, Ser: 0.68
GFR calc Af Amer: >60
GFR calc non Af Amer: >60
Glucose, Bld: 123 — ABNORMAL HIGH
Potassium: 4.1
Sodium: 138
Total Bilirubin: 0.6
Total Protein: 7.4

## 2011-03-08 LAB — POCT I-STAT GLUCOSE
Glucose, Bld: 158 — ABNORMAL HIGH
Operator id: 221371

## 2011-03-19 ENCOUNTER — Other Ambulatory Visit: Payer: Medicare Other | Admitting: *Deleted

## 2011-03-19 ENCOUNTER — Ambulatory Visit: Payer: Medicare Other | Admitting: Nurse Practitioner

## 2011-08-22 ENCOUNTER — Emergency Department (HOSPITAL_COMMUNITY): Payer: Medicare Other

## 2011-08-22 ENCOUNTER — Other Ambulatory Visit: Payer: Self-pay

## 2011-08-22 ENCOUNTER — Other Ambulatory Visit (HOSPITAL_COMMUNITY): Payer: Self-pay | Admitting: Pharmacy Technician

## 2011-08-22 ENCOUNTER — Inpatient Hospital Stay (HOSPITAL_COMMUNITY)
Admission: EM | Admit: 2011-08-22 | Discharge: 2011-08-25 | DRG: 638 | Disposition: A | Discharge status: Home / Self Care | Payer: Medicare Other | Attending: Internal Medicine | Admitting: Internal Medicine

## 2011-08-22 ENCOUNTER — Encounter (HOSPITAL_COMMUNITY): Payer: Self-pay | Admitting: Emergency Medicine

## 2011-08-22 DIAGNOSIS — I10 Essential (primary) hypertension: Secondary | ICD-10-CM | POA: Diagnosis present

## 2011-08-22 DIAGNOSIS — Z79899 Other long term (current) drug therapy: Secondary | ICD-10-CM

## 2011-08-22 DIAGNOSIS — I209 Angina pectoris, unspecified: Secondary | ICD-10-CM | POA: Diagnosis present

## 2011-08-22 DIAGNOSIS — I509 Heart failure, unspecified: Secondary | ICD-10-CM | POA: Diagnosis present

## 2011-08-22 DIAGNOSIS — Z9119 Patient's noncompliance with other medical treatment and regimen: Secondary | ICD-10-CM

## 2011-08-22 DIAGNOSIS — E785 Hyperlipidemia, unspecified: Secondary | ICD-10-CM | POA: Diagnosis present

## 2011-08-22 DIAGNOSIS — IMO0001 Reserved for inherently not codable concepts without codable children: Principal | ICD-10-CM | POA: Diagnosis present

## 2011-08-22 DIAGNOSIS — Z7902 Long term (current) use of antithrombotics/antiplatelets: Secondary | ICD-10-CM

## 2011-08-22 DIAGNOSIS — Z91199 Patient's noncompliance with other medical treatment and regimen due to unspecified reason: Secondary | ICD-10-CM

## 2011-08-22 DIAGNOSIS — I25810 Atherosclerosis of coronary artery bypass graft(s) without angina pectoris: Secondary | ICD-10-CM | POA: Diagnosis present

## 2011-08-22 DIAGNOSIS — R739 Hyperglycemia, unspecified: Secondary | ICD-10-CM

## 2011-08-22 DIAGNOSIS — E119 Type 2 diabetes mellitus without complications: Secondary | ICD-10-CM | POA: Diagnosis present

## 2011-08-22 DIAGNOSIS — Z7982 Long term (current) use of aspirin: Secondary | ICD-10-CM

## 2011-08-22 DIAGNOSIS — I5022 Chronic systolic (congestive) heart failure: Secondary | ICD-10-CM | POA: Diagnosis present

## 2011-08-22 LAB — BASIC METABOLIC PANEL
BUN: 15 mg/dL (ref 6–23)
Calcium: 9.8 mg/dL (ref 8.4–10.5)
Chloride: 92 mEq/L — ABNORMAL LOW (ref 96–112)
Creatinine, Ser: 0.79 mg/dL (ref 0.50–1.10)
GFR calc Af Amer: >90 mL/min (ref >90)
GFR calc non Af Amer: 78 mL/min — ABNORMAL LOW (ref >90)

## 2011-08-22 LAB — DIFFERENTIAL
Basophils Absolute: 0 10*3/uL (ref 0.0–0.1)
Basophils Relative: 0 % (ref 0–1)
Monocytes Absolute: 0.2 10*3/uL (ref 0.1–1.0)
Neutro Abs: 2.7 10*3/uL (ref 1.7–7.7)

## 2011-08-22 LAB — CBC
HCT: 35.9 % — ABNORMAL LOW (ref 36.0–46.0)
MCHC: 33.4 g/dL (ref 30.0–36.0)
Platelets: 322 10*3/uL (ref 150–400)
RDW: 12.3 % (ref 11.5–15.5)

## 2011-08-22 LAB — URINALYSIS, ROUTINE W REFLEX MICROSCOPIC
Bilirubin Urine: NEGATIVE
Glucose, UA: >1000 mg/dL — AB
Hgb urine dipstick: NEGATIVE
Ketones, ur: NEGATIVE mg/dL
Leukocytes, UA: NEGATIVE
Nitrite: NEGATIVE
Specific Gravity, Urine: 1.034 — ABNORMAL HIGH (ref 1.005–1.030)
pH: 5.5 (ref 5.0–8.0)

## 2011-08-22 LAB — URINE MICROSCOPIC-ADD ON

## 2011-08-22 LAB — GLUCOSE, CAPILLARY: Glucose-Capillary: >600 mg/dL (ref 70–99)

## 2011-08-22 LAB — TROPONIN I: Troponin I: <0.3 ng/mL (ref <0.30)

## 2011-08-22 MED ORDER — SIMVASTATIN 10 MG PO TABS
10.0000 mg | ORAL_TABLET | Freq: Every day | ORAL | Status: DC
  Administered 2011-08-23 – 2011-08-24 (×2): 10 mg via ORAL
  Filled 2011-08-22 (×3): qty 1

## 2011-08-22 MED ORDER — IRBESARTAN 75 MG PO TABS
75.0000 mg | ORAL_TABLET | Freq: Every day | ORAL | Status: DC
  Administered 2011-08-22 – 2011-08-23 (×2): 75 mg via ORAL
  Filled 2011-08-22 (×2): qty 1

## 2011-08-22 MED ORDER — INFLUENZA VIRUS VACC SPLIT PF IM SUSP
0.5000 mL | INTRAMUSCULAR | Status: AC
  Administered 2011-08-23: 0.5 mL via INTRAMUSCULAR
  Filled 2011-08-22: qty 0.5

## 2011-08-22 MED ORDER — FENOFIBRATE 160 MG PO TABS
160.0000 mg | ORAL_TABLET | Freq: Every day | ORAL | Status: DC
  Administered 2011-08-22 – 2011-08-25 (×4): 160 mg via ORAL
  Filled 2011-08-22 (×4): qty 1

## 2011-08-22 MED ORDER — ONDANSETRON HCL 4 MG PO TABS: 4.0000 mg | ORAL_TABLET | Freq: Four times a day (QID) | ORAL | Status: DC | PRN

## 2011-08-22 MED ORDER — ONDANSETRON HCL 4 MG/2ML IJ SOLN: 4.0000 mg | Freq: Four times a day (QID) | INTRAMUSCULAR | Status: DC | PRN

## 2011-08-22 MED ORDER — METFORMIN HCL ER 500 MG PO TB24
500.0000 mg | ORAL_TABLET | Freq: Two times a day (BID) | ORAL | Status: DC
  Administered 2011-08-23 (×2): 500 mg via ORAL
  Filled 2011-08-22 (×4): qty 1

## 2011-08-22 MED ORDER — ACETAMINOPHEN 650 MG RE SUPP: 650.0000 mg | Freq: Four times a day (QID) | RECTAL | Status: DC | PRN

## 2011-08-22 MED ORDER — INSULIN ASPART 100 UNIT/ML ~~LOC~~ SOLN: 0.0000 [IU] | Freq: Three times a day (TID) | SUBCUTANEOUS | Status: DC

## 2011-08-22 MED ORDER — INSULIN ASPART PROT & ASPART (70-30 MIX) 100 UNIT/ML ~~LOC~~ SUSP
30.0000 [IU] | Freq: Two times a day (BID) | SUBCUTANEOUS | Status: DC
  Administered 2011-08-22 – 2011-08-23 (×3): 30 [IU] via SUBCUTANEOUS
  Filled 2011-08-22: qty 3

## 2011-08-22 MED ORDER — CARVEDILOL 12.5 MG PO TABS
12.5000 mg | ORAL_TABLET | Freq: Two times a day (BID) | ORAL | Status: DC
  Administered 2011-08-23: 12.5 mg via ORAL
  Filled 2011-08-22 (×2): qty 1

## 2011-08-22 MED ORDER — SODIUM CHLORIDE 0.9 % IV SOLN: INTRAVENOUS | Status: DC

## 2011-08-22 MED ORDER — ASPIRIN 325 MG PO TABS
325.0000 mg | ORAL_TABLET | Freq: Every day | ORAL | Status: DC
  Administered 2011-08-22 – 2011-08-25 (×4): 325 mg via ORAL
  Filled 2011-08-22 (×4): qty 1

## 2011-08-22 MED ORDER — SODIUM CHLORIDE 0.9 % IV SOLN
INTRAVENOUS | Status: DC
  Administered 2011-08-22 – 2011-08-23 (×3): via INTRAVENOUS

## 2011-08-22 MED ORDER — ACETAMINOPHEN 325 MG PO TABS: 650.0000 mg | ORAL_TABLET | Freq: Four times a day (QID) | ORAL | Status: DC | PRN

## 2011-08-22 MED ORDER — ENOXAPARIN SODIUM 30 MG/0.3ML ~~LOC~~ SOLN
30.0000 mg | SUBCUTANEOUS | Status: DC
  Administered 2011-08-22: 30 mg via SUBCUTANEOUS
  Filled 2011-08-22 (×2): qty 0.3

## 2011-08-22 MED ORDER — CLOPIDOGREL BISULFATE 75 MG PO TABS
75.0000 mg | ORAL_TABLET | Freq: Every day | ORAL | Status: DC
  Administered 2011-08-23 – 2011-08-25 (×3): 75 mg via ORAL
  Filled 2011-08-22 (×3): qty 1

## 2011-08-22 MED ORDER — SODIUM CHLORIDE 0.9 % IV BOLUS (SEPSIS)
1000.0000 mL | Freq: Once | INTRAVENOUS | Status: AC
  Administered 2011-08-22: 1000 mL via INTRAVENOUS

## 2011-08-22 MED ORDER — NITROGLYCERIN 0.4 MG SL SUBL: 0.4000 mg | SUBLINGUAL_TABLET | SUBLINGUAL | Status: DC | PRN

## 2011-08-22 NOTE — Progress Notes (Signed)
ED CM left voice message at [phone] for financial counselor referral request from pt and her dtr

## 2011-08-22 NOTE — ED Provider Notes (Signed)
History     CSN: [id number]  Arrival date & time 08/22/11  1324   First MD Initiated Contact with Patient 08/22/11 1353      Chief Complaint  Patient presents with  . Fatigue  . Hyperglycemia    (Consider location/radiation/quality/duration/timing/severity/associated sxs/prior treatment) HPI Comments: Patient reports she has been out of her medications for many months.  For multiple months patient has been feeling weak, has had significant weight loss, has been thirsty all the time, urinating frequently.  Pt had triple bypass 6-7 years ago, states she has chest pain "all the time," described as "like an elephant stepped up there."  Last episode of chest pain was this morning, at rest, lasting 5 minutes. Pt no longer has a cardiologist (he moved out of the state) and does not have a PCP.  She came to the ED today because of increased weakness.  Family notes patient had "the flu" last week with sore throat, cough, chills, body aches, that is improving.    The history is provided by the patient.    Past Medical History  Diagnosis Date  . Coronary artery disease   . Diabetes mellitus   . Hyperlipidemia   . Hypertension     Past Surgical History  Procedure Date  . Coronary artery bypass graft 2005    THIS INCLUDED SAPHENOUS VEIN GRAFT TO THE PDA, SAPHENOUS VEIN GRAFT TO OBTUSE MARGINAL VESSEL, AS LIMA GRAFT TO THE LAD.  Marland Kitchen Cardiac catheterization 2008    EF 50-55%. SHOWED OCCLUSION OF VEIN GRAFT  . Cholecystectomy   . Transthoracic echocardiogram 05/04/2004    EF 50-55%  . Cardiovascular stress test 12/24/2006    EF 47%    Family History  Problem Relation Age of Onset  . Tuberculosis Mother   . Lung cancer Father   . Diabetes Sister   . Heart attack Brother   . Hypertension Brother   . Diabetes Brother     History  Substance Use Topics  . Smoking status: Never Smoker   . Smokeless tobacco: Not on file  . Alcohol Use: No    OB History    Grav Para Term Preterm  Abortions TAB SAB Ect Mult Living                  Review of Systems  Constitutional: Negative for fever.  Respiratory: Positive for cough. Negative for shortness of breath.   Cardiovascular: Positive for chest pain.  Gastrointestinal: Positive for constipation. Negative for nausea, vomiting, abdominal pain and diarrhea.  Genitourinary: Negative for dysuria.  All other systems reviewed and are negative.    Allergies  Review of patient's allergies indicates no known allergies.  Home Medications   Current Outpatient Rx  Name Route Sig Dispense Refill  . ASPIRIN 325 MG PO TABS Oral Take 325 mg by mouth daily.      Marland Kitchen CARVEDILOL 12.5 MG PO TABS Oral Take 1 tablet (12.5 mg total) by mouth 2 (two) times daily with a meal. 60 tablet 11  . CLOPIDOGREL BISULFATE 75 MG PO TABS Oral Take 1 tablet (75 mg total) by mouth daily. 30 tablet 5  . FENOFIBRATE 160 MG PO TABS Oral Take 160 mg by mouth daily.      Marland Kitchen METFORMIN HCL ER (MOD) 500 MG PO TB24 Oral Take 500 mg by mouth daily.      Marland Kitchen ONE-DAILY MULTI VITAMINS PO TABS Oral Take 1 tablet by mouth daily.      Marland Kitchen NIACIN ER (ANTIHYPERLIPIDEMIC)  500 MG PO TBCR Oral Take 500 mg by mouth at bedtime.      Marland Kitchen NITROGLYCERIN 0.4 MG SL SUBL Sublingual Place 1 tablet (0.4 mg total) under the tongue every 5 (five) minutes as needed for chest pain. 100 tablet 3  . OLMESARTAN MEDOXOMIL 40 MG PO TABS Oral Take 1 tablet (40 mg total) by mouth daily. 30 tablet 5  . PRAVASTATIN SODIUM 40 MG PO TABS Oral Take 1 tablet (40 mg total) by mouth daily. 30 tablet 11    BP 162/71  Pulse 92  Temp(Src) 98.9 F (37.2 C) (Oral)  Resp 20  SpO2 95%  Physical Exam  Constitutional: She is oriented to person, place, and time. She appears well-developed and well-nourished. No distress.  HENT:  Head: Normocephalic and atraumatic.  Neck: Neck supple.  Cardiovascular: Normal rate, regular rhythm and normal heart sounds.   Pulmonary/Chest: No respiratory distress. She has  decreased breath sounds in the left lower field. She has no wheezes. She has no rales. She exhibits no tenderness.  Abdominal: Soft. Bowel sounds are normal. She exhibits no distension. There is tenderness in the suprapubic area. There is no rebound and no guarding.  Neurological: She is alert and oriented to person, place, and time.  Skin: She is not diaphoretic.  Psychiatric: She has a normal mood and affect. Her behavior is normal. Judgment and thought content normal.    ED Course  Procedures (including critical care time)  Labs Reviewed  GLUCOSE, CAPILLARY - Abnormal; Notable for the following:    Glucose-Capillary >600 (*)    All other components within normal limits  URINALYSIS, ROUTINE W REFLEX MICROSCOPIC - Abnormal; Notable for the following:    Specific Gravity, Urine 1.034 (*)    Glucose, UA >1000 (*)    All other components within normal limits  BASIC METABOLIC PANEL - Abnormal; Notable for the following:    Sodium 130 (*)    Chloride 92 (*)    Glucose, Bld 660 (*)    GFR calc non Af Amer 78 (*)    All other components within normal limits  CBC - Abnormal; Notable for the following:    HCT 35.9 (*)    All other components within normal limits  GLUCOSE, CAPILLARY - Abnormal; Notable for the following:    Glucose-Capillary 457 (*)    All other components within normal limits  GLUCOSE, CAPILLARY - Abnormal; Notable for the following:    Glucose-Capillary 439 (*)    All other components within normal limits  DIFFERENTIAL  TROPONIN I  URINE MICROSCOPIC-ADD ON  HEMOGLOBIN A1C   Dg Chest 2 View  08/22/2011  *RADIOLOGY REPORT*  Clinical Data: Weakness and shortness of breath.  CHEST - 2 VIEW  Comparison: None  Findings: The heart is borderline enlarged.  There is tortuosity and calcification of the thoracic aorta.  Surgical changes from bypass surgery are noted.  No acute pulmonary findings.  No pleural effusion or pneumothorax.  The bony thorax is intact.  IMPRESSION: No  acute cardiopulmonary findings.  Original Report Authenticated By: P. Loralie Champagne, M.D.    2:36 PM Discussed patient with Dr Weldon Inches, plan is for admission.    Date: 08/22/2011  Rate: 88  Rhythm: normal sinus rhythm  QRS Axis: normal  Intervals: normal  ST/T Wave abnormalities: nonspecific ST/T changes  Conduction Disutrbances:none  Narrative Interpretation:   Old EKG Reviewed: none available     1. Hyperglycemia   2. Diabetes mellitus   3. Chest pain  MDM  Patient with DM, CAD s/p CABG off of her medications for the past 6 months.  She has experienced weight loss, weakness, polydipsia, polyuria.  Labs show hyperglycemia.  No DKA.  Troponin is negative.  No ischemic changes on EKG.  Patient admitted to [facility] for further evaluation and management.  Dr Jomarie Longs has accepted the patient.         Dillard Cannon [place], [place] 08/22/11 1907

## 2011-08-22 NOTE — ED Notes (Signed)
RN on 4E states unsure if room is ready. Needed to be zapped. States she will call me back when she finds out.

## 2011-08-22 NOTE — ED Notes (Signed)
Debbie from lab called with critical level of glucose 660.  Lillia Abed RN notified

## 2011-08-22 NOTE — ED Provider Notes (Signed)
Medical screening examination/treatment/procedure(s) were conducted as a shared visit with non-physician practitioner(s) and myself.  I personally evaluated the patient during the encounter Hx dm, not taking meds. Has polyuria and polydypsia and significant wt loss with weakness.  BG > 600. Will admit for further eval and tx  Cheri Guppy, MD 08/22/11 1538

## 2011-08-22 NOTE — Progress Notes (Signed)
ED CM contacted by ED SW to assist pt with medication concerns. Pt with Hx of CHF/CAD/DM but has not been taking her medications.  Pt and husband both with medical issues Husband with lung cancer and hx dvt followed by [facility] Pt with Pt states she has been using [facility].  Both confirm that they are on a limited income and can sometimes only afford their mortgage. They have attempted to get other community resources with some success.  CM discussed [facility] $4 for coreg and cholesterol medication.  Pt also have issue with Insulin cost Discussed [facility] insulin program.  Provided list of [place] crisis programs to assist with other household bills therefore leaving funds for pt medications.  Cm discussed with husband the [place] pay relief patient advocate foundation and Green card from the cancer center. Cm provided husband with written information to take to his appointment on 08/31/11 and will follow up Pt mentioned that her pcp was attempting to wean her off of plavix CM spoke with attending MD about this but Attending MD states pt needs to remain on plavix.

## 2011-08-22 NOTE — ED Notes (Signed)
Pt presenting to ed with c/o generalized weakness, decreased eating and weight loss per family at bedside. Pt states she feels just weak. Pt states she has not had any diabetic medications x 6 months and she has not had it checked. Pt is alert and oriented at this time

## 2011-08-22 NOTE — H&P (Signed)
PCP: None  No primary provider on file.  Primary cardiologist Dr. Peter Swaziland Chief Complaint:  Weakness, intermittent chest pain, polyuria polydipsia  HPI: This is a 78 year old African American female with history of CAD status post CABG, diabetes, hypertension and dyslipidemia stop taking her medicines or 6 months ago due to issues related to cost. Since then she has noted increase weakness, weight loss of approximately 40 pounds over the last 6 months, polyuria polydipsia and polyphagia. Patient reports that she stopped taking her insulin 6 months ago and gradually stopped her other medicines too. In addition she also complains of intermittent sternal chest discomfort, which he describes as pressure-like off and on for the last 3-4 months Upon evaluation in the emergency room she was noted to have a CBG of 660, without evidence of acidosis, and otherwise unremarkable laboratory evaluation and [facility] were consulted for further amount evaluation management. She is treated with right IV NovoLog in the emergency room and her CBGs are down to 470.  Allergies:  No Known Allergies    Past Medical History  Diagnosis Date  . Coronary artery disease   . Diabetes mellitus   . Hyperlipidemia   . Hypertension     Past Surgical History  Procedure Date  . Coronary artery bypass graft 2005    THIS INCLUDED SAPHENOUS VEIN GRAFT TO THE PDA, SAPHENOUS VEIN GRAFT TO OBTUSE MARGINAL VESSEL, AS LIMA GRAFT TO THE LAD.  [facility] Kitchen Cardiac catheterization 2008    EF 50-55%. SHOWED OCCLUSION OF VEIN GRAFT  . Cholecystectomy   . Transthoracic echocardiogram 05/04/2004    EF 50-55%  . Cardiovascular stress test 12/24/2006    EF 47%    Prior to Admission medications   Medication Sig Start Date End Date Taking? Authorizing Provider  carvedilol (COREG) 12.5 MG tablet Take 1 tablet (12.5 mg total) by mouth 2 (two) times daily with a meal. 02/16/11  Yes Peter M Swaziland, MD  clopidogrel (PLAVIX) 75 MG  tablet Take 1 tablet (75 mg total) by mouth daily. 02/09/11 02/09/12 Yes Peter M Swaziland, MD  fenofibrate 160 MG tablet Take 160 mg by mouth daily.     Yes Historical Provider, MD  metFORMIN (GLUMETZA) 500 MG (MOD) 24 hr tablet Take 500 mg by mouth daily.     Yes Historical Provider, MD  Multiple Vitamin (MULTIVITAMIN) tablet Take 1 tablet by mouth daily.     Yes Historical Provider, MD  niacin (NIASPAN) 500 MG CR tablet Take 500 mg by mouth at bedtime.     Yes Historical Provider, MD  nitroGLYCERIN (NITROSTAT) 0.4 MG SL tablet Place 1 tablet (0.4 mg total) under the tongue every 5 (five) minutes as needed for chest pain. 02/16/11 02/16/12 Yes Peter M Swaziland, MD  olmesartan (BENICAR) 40 MG tablet Take 1 tablet (40 mg total) by mouth daily. 02/09/11 02/09/12 Yes Peter M Swaziland, MD  pravastatin (PRAVACHOL) 40 MG tablet Take 1 tablet (40 mg total) by mouth daily. 02/16/11  Yes Peter M Swaziland, MD  aspirin 325 MG tablet Take 325 mg by mouth daily.      Historical Provider, MD  Fenofibrate 150 MG CAPS Take 160 mg by mouth daily.   02/09/11 02/13/11  Peter M Swaziland, MD    Social History: Lives at home with her husband denies any history of tobacco, alcohol. She is independent in ADLs   Family History  Problem Relation Age of Onset  . Tuberculosis Mother   . Lung cancer Father   . Diabetes Sister   .  Heart attack Brother   . Hypertension Brother   . Diabetes Brother     Review of Systems:  Constitutional: Denies fever, chills, diaphoresis, appetite change and fatigue.  HEENT: Denies photophobia, eye pain, redness, hearing loss, ear pain, congestion, sore throat, rhinorrhea, sneezing, mouth sores, trouble swallowing, neck pain, neck stiffness and tinnitus.   Respiratory: Denies SOB, DOE, cough, chest tightness,  and wheezing.   Cardiovascular: Denies chest pain, palpitations and leg swelling.  Gastrointestinal: Denies nausea, vomiting, abdominal pain, diarrhea, constipation, blood in stool and abdominal  distention.  Genitourinary: Denies dysuria, urgency, frequency, hematuria, flank pain and difficulty urinating.  Musculoskeletal: Denies myalgias, back pain, joint swelling, arthralgias and gait problem.  Skin: Denies pallor, rash and wound.  Neurological: Denies dizziness, seizures, syncope, weakness, light-headedness, numbness and headaches.  Hematological: Denies adenopathy. Easy bruising, personal or family bleeding history  Psychiatric/Behavioral: Denies suicidal ideation, mood changes, confusion, nervousness, sleep disturbance and agitation   Physical Exam: Blood pressure 137/86, pulse 77, temperature 98.9 F (37.2 C), temperature source Oral, resp. rate 14, SpO2 99.00%. Gen: Alert awake oriented x3 in no acute distress HEENT oral mucosa is dry neck no JVD or lymphadenopathy, poor dental hygiene, CVS S1-S2 regular rate rhythm no murmurs rubs or gallops Lungs clear with diminished air movement bilaterally Abdomen soft nontender with normal bowel sounds no organomegaly no flank tenderness Extremities no edema clubbing or cyanosis  Neuro: Extremities no localizing signs Labs on Admission:  Results for orders placed during the hospital encounter of 08/22/11 (from the past 48 hour(s))  GLUCOSE, CAPILLARY     Status: Abnormal   Collection Time   08/22/11  1:49 PM      Component Value Range Comment   Glucose-Capillary >600 (*) 70 - 99 (mg/dL)    Comment 1 Documented in Chart      Comment 2 Notify RN     BASIC METABOLIC PANEL     Status: Abnormal   Collection Time   08/22/11  2:38 PM      Component Value Range Comment   Sodium 130 (*) 135 - 145 (mEq/L)    Potassium 4.5  3.5 - 5.1 (mEq/L)    Chloride 92 (*) 96 - 112 (mEq/L)    CO2 28  19 - 32 (mEq/L)    Glucose, Bld 660 (*) 70 - 99 (mg/dL)    BUN 15  6 - 23 (mg/dL)    Creatinine, Ser 1.91  0.50 - 1.10 (mg/dL)    Calcium 9.8  8.4 - 10.5 (mg/dL)    GFR calc non Af Amer 78 (*) >90 (mL/min)    GFR calc Af Amer >90  >90 (mL/min)   CBC      Status: Abnormal   Collection Time   08/22/11  2:38 PM      Component Value Range Comment   WBC 5.1  4.0 - 10.5 (K/uL)    RBC 4.13  3.87 - 5.11 (MIL/uL)    Hemoglobin 12.0  12.0 - 15.0 (g/dL)    HCT 47.8 (*) 29.5 - 46.0 (%)    MCV 86.9  78.0 - 100.0 (fL)    MCH 29.1  26.0 - 34.0 (pg)    MCHC 33.4  30.0 - 36.0 (g/dL)    RDW 62.1  30.8 - 65.7 (%)    Platelets 322  150 - 400 (K/uL)   DIFFERENTIAL     Status: Normal   Collection Time   08/22/11  2:38 PM      Component Value Range Comment  Neutrophils Relative 53  43 - 77 (%)    Neutro Abs 2.7  1.7 - 7.7 (K/uL)    Lymphocytes Relative 41  12 - 46 (%)    Lymphs Abs 2.1  0.7 - 4.0 (K/uL)    Monocytes Relative 4  3 - 12 (%)    Monocytes Absolute 0.2  0.1 - 1.0 (K/uL)    Eosinophils Relative 2  0 - 5 (%)    Eosinophils Absolute 0.1  0.0 - 0.7 (K/uL)    Basophils Relative 0  0 - 1 (%)    Basophils Absolute 0.0  0.0 - 0.1 (K/uL)   TROPONIN I     Status: Normal   Collection Time   08/22/11  2:38 PM      Component Value Range Comment   Troponin I <0.30  <0.30 (ng/mL)   URINALYSIS, ROUTINE W REFLEX MICROSCOPIC     Status: Abnormal   Collection Time   08/22/11  2:50 PM      Component Value Range Comment   Color, Urine YELLOW  YELLOW     APPearance CLEAR  CLEAR     Specific Gravity, Urine 1.034 (*) 1.005 - 1.030     pH 5.5  5.0 - 8.0     Glucose, UA >1000 (*) NEGATIVE (mg/dL)    Hgb urine dipstick NEGATIVE  NEGATIVE     Bilirubin Urine NEGATIVE  NEGATIVE     Ketones, ur NEGATIVE  NEGATIVE (mg/dL)    Protein, ur NEGATIVE  NEGATIVE (mg/dL)    Urobilinogen, UA 0.2  0.0 - 1.0 (mg/dL)    Nitrite NEGATIVE  NEGATIVE     Leukocytes, UA NEGATIVE  NEGATIVE    URINE MICROSCOPIC-ADD ON     Status: Normal   Collection Time   08/22/11  2:50 PM      Component Value Range Comment   Squamous Epithelial / LPF RARE  RARE     WBC, UA 0-2  <3 (WBC/hpf)    RBC / HPF 0-2  <3 (RBC/hpf)    Bacteria, UA RARE  RARE    GLUCOSE, CAPILLARY     Status:  Abnormal   Collection Time   08/22/11  4:54 PM      Component Value Range Comment   Glucose-Capillary 457 (*) 70 - 99 (mg/dL)     Radiological Exams on Admission: Dg Chest 2 View  08/22/2011  *RADIOLOGY REPORT*  Clinical Data: Weakness and shortness of breath.  CHEST - 2 VIEW  Comparison: None  Findings: The heart is borderline enlarged.  There is tortuosity and calcification of the thoracic aorta.  Surgical changes from bypass surgery are noted.  No acute pulmonary findings.  No pleural effusion or pneumothorax.  The bony thorax is intact.  IMPRESSION: No acute cardiopulmonary findings.  Original Report Authenticated By: P. Loralie Champagne, M.D.    Assessment/Plan 1. uncontrolled hyperglycemia without evidence of DKA 2. Medication noncompliance  3. Diabetes mellitus previously on insulin 4. CAD status post CABG in 2005 a cardiac cath in 2008 with occluded grafts : Supposed to be on maximal medical management. 5. intermittent chronic angina  Plan: Since her CBGs have improved with IV NovoLog received in the emergency room, will start her on long-acting insulin along with NovoLog sliding scale. Restart metformin twice a day. Check hemoglobin A1c Compliance and cost management will be challenges. Willl consult diabetic coordinator, case manager and clinical social worker to assist with the situation. In terms of her CAD/CABG with occluded grafts: Suspect her intermittent  angina is secondary to this, resume aspirin, Plavix, Coreg, nitroglycerin when necessary.  EKG without acute ST-T wave changes  Depending on symptomatology over the next 24-48 hours may need long-acting nitroglycerin or Ranexa. Will also check a 2-D echocardiogram. DVT prophylaxis with Lovenox Further management as condition evolves Also needs assistance with finding a new primary care physician  Time Spent on Admission:  Jaxx Huish [facility] Pager: [phone] 08/22/2011, 5:43 PM

## 2011-08-22 NOTE — ED Notes (Signed)
Kim, Case Manager, at bedside speaking with family.

## 2011-08-22 NOTE — ED Notes (Signed)
Admitting MD at bedside. NAD noted at this time.

## 2011-08-22 NOTE — Progress Notes (Signed)
ED CM attempted to find plavix and benicar coupons on needymeds.com but none available to successfully decrease pt's cost.  CM left voice message for [facility] CHF CM to see if pt qualifies for any assistance Pending return call.  ED SW spoke with CHF SW to find out pt is not a candidate for CHF services (informed Dx primarily CAD related)

## 2011-08-22 NOTE — ED Notes (Signed)
Attempt x1 to call report to 4E. States RN will call me back. 

## 2011-08-22 NOTE — Progress Notes (Signed)
ED CM spoke with dtr now at bedside to review resources provided to parents Reviewed needymeds.com Dtr works at [facility] and will be attempting to assist also Dtr inquired about cost of benicar and plavix from [employer] (another offered resource) ED Cm spoke with Nida Boatman at [facility] outpatient pharmacy who confirms cost of benicar without insurance will be $159 and generic plavix $10-15.  Brad unable to find Medicare part D for pt.  CM reviewed conversation with pharmacy with pt and Dtr. Pt confirmed she & her husband discontinued  Medicare Part D to eliminate extra money coming out of SSI.  CM discuss Financial counselor consult and/or contact to Medicare to re instate Medicare part D.   Dtr states she will assist also

## 2011-08-22 NOTE — ED Notes (Signed)
Patient has not taken medications in past 3 months due to to funds nor has she been able to see physicians due to inability to pay copay

## 2011-08-22 NOTE — ED Notes (Signed)
Pt given sandwhich and diet ginger ale to eat. Per Admitting MD. NAD noted at this time.

## 2011-08-22 NOTE — ED Notes (Addendum)
CSW consult was requested by Pharm. Tech to assist pt with finding resources to afford medications. CSW contacted CSW Amy Stuckey with the congestive heart failure line at [facility] in order to get information on the new resources available for medication assistance and to determine the criteria for the service. CSW is currently awaiting a return call. CSW was made aware that pt was going to be admitted to the hospital at this time.    CSW spoke with ED CM regarding pt's need for medication assistance. ED CM stated that at least one of the pt's medications were offered at [facility] for $4. ED CM agreed to talk with pt about low cost medication assistance programs at local pharmacies.  5:02pm CSW has spoken with CSW Amy at [facility] who states that the pt will no qualify for heart failure medication assistance due to pt having insurance on file.   CSW will continue following pt to address financial needs for a safe discharge.

## 2011-08-23 DIAGNOSIS — I059 Rheumatic mitral valve disease, unspecified: Secondary | ICD-10-CM

## 2011-08-23 LAB — COMPREHENSIVE METABOLIC PANEL
ALT: 6 U/L (ref 0–35)
AST: 9 U/L (ref 0–37)
Albumin: 2.6 g/dL — ABNORMAL LOW (ref 3.5–5.2)
CO2: 26 mEq/L (ref 19–32)
Calcium: 8.6 mg/dL (ref 8.4–10.5)
Creatinine, Ser: 0.74 mg/dL (ref 0.50–1.10)
GFR calc non Af Amer: 80 mL/min — ABNORMAL LOW (ref >90)
Sodium: 139 mEq/L (ref 135–145)
Total Protein: 5.4 g/dL — ABNORMAL LOW (ref 6.0–8.3)

## 2011-08-23 LAB — CBC
MCH: 29 pg (ref 26.0–34.0)
MCHC: 33.8 g/dL (ref 30.0–36.0)
MCV: 86 fL (ref 78.0–100.0)
Platelets: 294 10*3/uL (ref 150–400)
RBC: 3.65 MIL/uL — ABNORMAL LOW (ref 3.87–5.11)
RDW: 12.3 % (ref 11.5–15.5)

## 2011-08-23 LAB — HEMOGLOBIN A1C
Hgb A1c MFr Bld: 19 % — ABNORMAL HIGH (ref <5.7)
Mean Plasma Glucose: 499 mg/dL — ABNORMAL HIGH (ref <117)

## 2011-08-23 LAB — GLUCOSE, CAPILLARY
Glucose-Capillary: 102 mg/dL — ABNORMAL HIGH (ref 70–99)
Glucose-Capillary: 98 mg/dL (ref 70–99)
Glucose-Capillary: 99 mg/dL (ref 70–99)
Glucose-Capillary: 68 mg/dL — ABNORMAL LOW (ref 70–99)
Glucose-Capillary: 86 mg/dL (ref 70–99)

## 2011-08-23 MED ORDER — ATENOLOL 50 MG PO TABS
50.0000 mg | ORAL_TABLET | Freq: Every day | ORAL | Status: DC
  Administered 2011-08-23 – 2011-08-25 (×3): 50 mg via ORAL
  Filled 2011-08-23 (×3): qty 1

## 2011-08-23 MED ORDER — LISINOPRIL 10 MG PO TABS
10.0000 mg | ORAL_TABLET | Freq: Every day | ORAL | Status: DC
  Administered 2011-08-23 – 2011-08-25 (×3): 10 mg via ORAL
  Filled 2011-08-23 (×3): qty 1

## 2011-08-23 MED ORDER — ENOXAPARIN SODIUM 40 MG/0.4ML ~~LOC~~ SOLN
40.0000 mg | SUBCUTANEOUS | Status: DC
  Administered 2011-08-23 – 2011-08-24 (×2): 40 mg via SUBCUTANEOUS
  Filled 2011-08-23 (×3): qty 0.4

## 2011-08-23 MED FILL — Insulin Aspart Prot & Aspart (Human) Inj 100 Unit/ML (70-30): SUBCUTANEOUS | Qty: 0.3 | Status: AC

## 2011-08-23 NOTE — Progress Notes (Signed)
*  PRELIMINARY RESULTS* Echocardiogram 2D Echocardiogram has been performed.  Mckenzie Williamson 08/23/2011, 3:55 PM

## 2011-08-23 NOTE — Progress Notes (Signed)
INITIAL ADULT NUTRITION ASSESSMENT Date: 08/23/2011   Time: 2:57 PM Reason for Assessment: Patient screened at nutrition risk for unintentional weight loss and dysphagia.   ASSESSMENT: Female 78 y.o.  Dx: <principal problem not specified>  Hx:  Past Medical History  Diagnosis Date  . Coronary artery disease   . Diabetes mellitus   . Hyperlipidemia   . Hypertension     Related Meds:  Scheduled Meds:   . aspirin  325 mg Oral Daily  . atenolol  50 mg Oral Daily  . clopidogrel  75 mg Oral Q breakfast  . enoxaparin  40 mg Subcutaneous Q24H  . fenofibrate  160 mg Oral Daily  . influenza  inactive virus vaccine  0.5 mL Intramuscular Tomorrow-1000  . insulin aspart  0-15 Units Subcutaneous TID WC  . insulin aspart protamine-insulin aspart  30 Units Subcutaneous BID WC  . lisinopril  10 mg Oral Daily  . metFORMIN  500 mg Oral BID WC  . simvastatin  10 mg Oral q1800  . sodium chloride  1,000 mL Intravenous Once  . DISCONTD: sodium chloride   Intravenous STAT  . DISCONTD: carvedilol  12.5 mg Oral BID WC  . DISCONTD: enoxaparin  30 mg Subcutaneous Q24H  . DISCONTD: irbesartan  75 mg Oral Daily   Continuous Infusions:   . sodium chloride Stopped (08/23/11 1004)   PRN Meds:.acetaminophen, acetaminophen, nitroGLYCERIN, ondansetron (ZOFRAN) IV, ondansetron    Ht: 5\' 3"  (160 cm)  Wt: 153 lb 11.2 oz (69.718 kg)  Ideal Wt: 52.27 kg % Ideal Wt: 133%  Usual Wt: 178 lb. % Usual Wt: 85.9%  Body mass index is 27.23 kg/(m^2).  Food/Nutrition Related Hx: Patient on carb modified diet with documented PO intake 100% at meals. Patient reports unintentional weight loss from UBW of 178 lb. Patient with 25 lb. Weight loss. She stated that she stopped eating because she ran out of her insulin. Patient reported good appetite. Patient voiced snack preferences: yogurt, cheese and crackers.   Labs:  CMP     Component Value Date/Time   NA 139 08/23/2011 0520   K 3.3* 08/23/2011 0520   CL  104 08/23/2011 0520   CO2 26 08/23/2011 0520   GLUCOSE 87 08/23/2011 0520   BUN 12 08/23/2011 0520   CREATININE 0.74 08/23/2011 0520   CALCIUM 8.6 08/23/2011 0520   PROT 5.4* 08/23/2011 0520   ALBUMIN 2.6* 08/23/2011 0520   AST 9 08/23/2011 0520   ALT 6 08/23/2011 0520   ALKPHOS 71 08/23/2011 0520   BILITOT 0.3 08/23/2011 0520   GFRNONAA 80* 08/23/2011 0520   GFRAA >90 08/23/2011 0520    Intake/Output Summary (Last 24 hours) at 08/23/11 1502 Last data filed at 08/23/11 1422  Gross per 24 hour  Intake 1886.67 ml  Output   1825 ml  Net  61.67 ml     Diet Order: Carb Control  Supplements/Tube Feeding: none at this time  IVF:    sodium chloride Last Rate: Stopped (08/23/11 1004)    Estimated Nutritional Needs:   Kcal: 1150-1450 Protein: 76.5-90.4 grams Fluid: 1 ml per kcal  NUTRITION DIAGNOSIS: Unintentional weight loss (Dawson-3.2)  RELATED TO: skipping meals  AS EVIDENCE BY: patient reported skipping meals due to running out of insulin and patient is 89.5% of UBW.   MONITORING/EVALUATION(Goals): PO intake, weight trends, labs 1. PO intake >75% at meals and snacks. 3. Promote weight maintenance.   EDUCATION NEEDS: -No education needs identified at this time  INTERVENTION: 1. Will order patient  snacks BID. 2. RD to follow for nutrition needs.   Dietitian [phone]  DOCUMENTATION CODES Per approved criteria  -Not Applicable    Iven Finn [facility] 08/23/2011, 2:57 PM

## 2011-08-23 NOTE — Progress Notes (Signed)
Subjective: Feels much better overall, no further CP  Objective: Vital signs in last 24 hours: Temp:  [98.1 F (36.7 C)-98.9 F (37.2 C)] 98.1 F (36.7 C) (03/28 0435) Pulse Rate:  [77-92] 84  (03/28 0435) Resp:  [14-20] 20  (03/28 0435) BP: (128-163)/(63-97) 128/63 mmHg (03/28 0435) SpO2:  [91 %-99 %] 97 % (03/28 0435) Weight:  [69.718 kg (153 lb 11.2 oz)] 69.718 kg (153 lb 11.2 oz) (03/27 2010) Weight change:  Last BM Date: 08/21/11  Intake/Output from previous day: 03/27 0701 - 03/28 0700 In: 1406.7 [P.O.:240; I.V.:1166.7] Out: 1425 [Urine:1425] Total I/O In: 240 [P.O.:240] Out: -   Physical Exam: General: Alert, awake, oriented x3, in no acute distress. HEENT: No bruits, no goiter. Heart: Regular rate and rhythm, without murmurs, rubs, gallops. Lungs: Clear to auscultation bilaterally. Abdomen: Soft, nontender, nondistended, positive bowel sounds. Extremities: No clubbing cyanosis or edema with positive pedal pulses. Neuro: Grossly intact, nonfocal.  Lab Results: Basic Metabolic Panel:  Basename 08/23/11 0520 08/22/11 1438  NA 139 130*  K 3.3* 4.5  CL 104 92*  CO2 26 28  GLUCOSE 87 660*  BUN 12 15  CREATININE 0.74 0.79  CALCIUM 8.6 9.8  MG -- --  PHOS -- --   Liver Function Tests:  Basename 08/23/11 0520  AST 9  ALT 6  ALKPHOS 71  BILITOT 0.3  PROT 5.4*  ALBUMIN 2.6*   No results found for this basename: LIPASE:2,AMYLASE:2 in the last 72 hours No results found for this basename: AMMONIA:2 in the last 72 hours CBC:  Basename 08/23/11 0520 08/22/11 1438  WBC 6.2 5.1  NEUTROABS -- 2.7  HGB 10.6* 12.0  HCT 31.4* 35.9*  MCV 86.0 86.9  PLT 294 322   Cardiac Enzymes:  Basename 08/22/11 1438  CKTOTAL --  CKMB --  CKMBINDEX --  TROPONINI <0.30   BNP: No results found for this basename: PROBNP:3 in the last 72 hours D-Dimer: No results found for this basename: DDIMER:2 in the last 72 hours CBG:  Basename 08/23/11 0739 08/22/11 2110  08/22/11 1840 08/22/11 1654 08/22/11 1349  GLUCAP 102* 427* 439* 457* >600*   Hemoglobin A1C:  Basename 08/22/11 1742  HGBA1C 19.0*   Fasting Lipid Panel: No results found for this basename: CHOL,HDL,LDLCALC,TRIG,CHOLHDL,LDLDIRECT in the last 72 hours Thyroid Function Tests: No results found for this basename: TSH,T4TOTAL,FREET4,T3FREE,THYROIDAB in the last 72 hours Anemia Panel: No results found for this basename: VITAMINB12,FOLATE,FERRITIN,TIBC,IRON,RETICCTPCT in the last 72 hours Coagulation: No results found for this basename: LABPROT:2,INR:2 in the last 72 hours Urine Drug Screen: Drugs of Abuse  No results found for this basename: labopia, cocainscrnur, labbenz, amphetmu, thcu, labbarb    Alcohol Level: No results found for this basename: ETH:2 in the last 72 hours Urinalysis:  Basename 08/22/11 1450  COLORURINE YELLOW  LABSPEC 1.034*  PHURINE 5.5  GLUCOSEU >1000*  HGBUR NEGATIVE  BILIRUBINUR NEGATIVE  KETONESUR NEGATIVE  PROTEINUR NEGATIVE  UROBILINOGEN 0.2  NITRITE NEGATIVE  LEUKOCYTESUR NEGATIVE    No results found for this or any previous visit (from the past 240 hour(s)).  Studies/Results: Dg Chest 2 View  08/22/2011  *RADIOLOGY REPORT*  Clinical Data: Weakness and shortness of breath.  CHEST - 2 VIEW  Comparison: None  Findings: The heart is borderline enlarged.  There is tortuosity and calcification of the thoracic aorta.  Surgical changes from bypass surgery are noted.  No acute pulmonary findings.  No pleural effusion or pneumothorax.  The bony thorax is intact.  IMPRESSION: No acute  cardiopulmonary findings.  Original Report Authenticated By: P. Loralie Champagne, M.D.    Medications: Scheduled Meds:   . aspirin  325 mg Oral Daily  . carvedilol  12.5 mg Oral BID WC  . clopidogrel  75 mg Oral Q breakfast  . enoxaparin  30 mg Subcutaneous Q24H  . fenofibrate  160 mg Oral Daily  . influenza  inactive virus vaccine  0.5 mL Intramuscular Tomorrow-1000  .  insulin aspart  0-15 Units Subcutaneous TID WC  . insulin aspart protamine-insulin aspart  30 Units Subcutaneous BID WC  . lisinopril  10 mg Oral Daily  . metFORMIN  500 mg Oral BID WC  . simvastatin  10 mg Oral q1800  . sodium chloride  1,000 mL Intravenous Once  . DISCONTD: sodium chloride   Intravenous STAT  . DISCONTD: irbesartan  75 mg Oral Daily   Continuous Infusions:   . sodium chloride 125 mL/hr at 08/23/11 0648   PRN Meds:.acetaminophen, acetaminophen, nitroGLYCERIN, ondansetron (ZOFRAN) IV, ondansetron  Assessment/Plan: 1. uncontrolled hyperglycemia without evidence of DKA -improved 2. Medication noncompliance  3. Diabetes mellitus previously on insulin  4. CAD status post CABG in 2005 a cardiac cath in 2008 with occluded grafts : Supposed to be on maximal medical management.  5. intermittent chronic angina  Continue Insulin 70/30 metformin twice a day.  hemoglobin A1c 19 Compliance and cost management will be challenges.  Willl consult diabetic coordinator, case manager and clinical social worker to assist with the situation.  In terms of her CAD/CABG with occluded grafts: Suspect her intermittent angina is secondary to this, resume aspirin, Plavix, Coreg, nitroglycerin when necessary.  EKG without acute ST-T wave changes  Will also check a 2-D echocardiogram.  DVT prophylaxis with Lovenox  Also needs assistance with finding a new primary care physician   Dispo: home tomorrow    LOS: 1 day   [facility] Pager: [phone] 08/23/2011, 10:08 AM

## 2011-08-23 NOTE — Progress Notes (Signed)
Inpatient Diabetes Program  Pt states she stopped taking her insulin around a year ago d/t finances.  Does not have PCP.  Previously took Novolog 70/30 20 units bid.  Has lost a lot of weight recently.  Has glucose meter at home that husband uses daily, but pt stopped checking hers around 6 months ago.  Will need PCP to manage diabetes after discharge.  Agree with 70/30 30 units bid.  Social work and case management also involved with obtaining MD and prescription after discharge. Discussed hypoglycemia and treatment with pt. Verbalized understanding.  Will continue to follow for needs.  Results for Mckenzie Williamson, Mckenzie Williamson (MRN [id number]) as of 08/23/2011 13:38  Ref. Range 08/22/2011 16:54 08/22/2011 18:40 08/22/2011 21:10 08/23/2011 07:39 08/23/2011 11:54  Glucose-Capillary Latest Range: 70-99 mg/dL 409 (H) 811 (H) 914 (H) 102 (H) 98  Results for Mckenzie Williamson, Mckenzie Williamson (MRN [id number]) as of 08/23/2011 13:38  Ref. Range 08/22/2011 17:42  Hemoglobin A1C Latest Range: <5.7 % 19.0 (H)

## 2011-08-23 NOTE — Progress Notes (Signed)
CBG: 68  Treatment: 15 GM carbohydrate snack   Symptoms: None  Follow-up CBG: Time: 2144 CBG Result: 86 Possible Reasons for Event: Unknown  Comments/MD notified:    Lestine Box

## 2011-08-24 LAB — BASIC METABOLIC PANEL
BUN: 11 mg/dL (ref 6–23)
Chloride: 107 mEq/L (ref 96–112)
GFR calc Af Amer: >90 mL/min (ref >90)
GFR calc non Af Amer: 81 mL/min — ABNORMAL LOW (ref >90)
Potassium: 3.4 mEq/L — ABNORMAL LOW (ref 3.5–5.1)
Sodium: 141 mEq/L (ref 135–145)

## 2011-08-24 LAB — GLUCOSE, CAPILLARY
Glucose-Capillary: 146 mg/dL — ABNORMAL HIGH (ref 70–99)
Glucose-Capillary: 137 mg/dL — ABNORMAL HIGH (ref 70–99)
Glucose-Capillary: 100 mg/dL — ABNORMAL HIGH (ref 70–99)
Glucose-Capillary: 124 mg/dL — ABNORMAL HIGH (ref 70–99)

## 2011-08-24 MED ORDER — METFORMIN HCL 500 MG PO TABS
500.0000 mg | ORAL_TABLET | Freq: Every day | ORAL | Status: DC
  Administered 2011-08-25: 500 mg via ORAL
  Filled 2011-08-24: qty 1

## 2011-08-24 MED ORDER — INSULIN ASPART PROT & ASPART (70-30 MIX) 100 UNIT/ML ~~LOC~~ SUSP
20.0000 [IU] | Freq: Two times a day (BID) | SUBCUTANEOUS | Status: DC
  Administered 2011-08-24 – 2011-08-25 (×3): 20 [IU] via SUBCUTANEOUS
  Filled 2011-08-24: qty 3

## 2011-08-24 MED ORDER — GLUCOSE 40 % PO GEL
ORAL | Status: AC
  Administered 2011-08-24: 37.5 g
  Filled 2011-08-24: qty 1

## 2011-08-24 MED ORDER — INSULIN ASPART 100 UNIT/ML ~~LOC~~ SOLN
0.0000 [IU] | Freq: Three times a day (TID) | SUBCUTANEOUS | Status: DC
  Administered 2011-08-25: 2 [IU] via SUBCUTANEOUS
  Administered 2011-08-25: 3 [IU] via SUBCUTANEOUS

## 2011-08-24 NOTE — ED Provider Notes (Signed)
Medical screening examination/treatment/procedure(s) were performed by non-physician practitioner and as supervising physician I was immediately available for consultation/collaboration.  Cheri Guppy, MD 08/24/11 1505

## 2011-08-24 NOTE — Progress Notes (Signed)
PT Cancellation Note  Evaluation cancelled today due to patient's refusal to participate.  Pt immediately refused therapy upon entering room stating she could not get out of bed today.  Adal Sereno,KATHrine E 08/24/2011, 12:32 PM Pager: [phone]

## 2011-08-24 NOTE — Progress Notes (Signed)
CBG: 48  Treatment: 1 tube instant glucose  Symptoms: Sweaty  Follow-up CBG: Time:0611 CBG Result:58  Possible Reasons for Event: Medication regimen: Metformin BID, Novolog 70/30 30 units BID  Comments/MD notified:    Lestine Box

## 2011-08-24 NOTE — Progress Notes (Addendum)
Subjective: Hypoglycemic event this am, very upset abt it  Objective: Vital signs in last 24 hours: Temp:  [97.5 F (36.4 C)-98.4 F (36.9 C)] 97.5 F (36.4 C) (03/29 0530) Pulse Rate:  [66-80] 66  (03/29 0530) Resp:  [18] 18  (03/29 0530) BP: (120-146)/(69-76) 129/73 mmHg (03/29 0530) SpO2:  [97 %-99 %] 99 % (03/29 0530) Weight:  [69.8 kg (153 lb 14.1 oz)] 69.8 kg (153 lb 14.1 oz) (03/29 0530) Weight change: 0.082 kg (2.9 oz) Last BM Date: 08/21/11  Intake/Output from previous day: 03/28 0701 - 03/29 0700 In: 480 [P.O.:480] Out: 1050 [Urine:1050] Total I/O In: 300 [P.O.:300] Out: -   Physical Exam: General: Alert, awake, oriented x3, in no acute distress. HEENT: No bruits, no goiter. Heart: Regular rate and rhythm, without murmurs, rubs, gallops. Lungs: Clear to auscultation bilaterally. Abdomen: Soft, nontender, nondistended, positive bowel sounds. Extremities: No clubbing cyanosis or edema with positive pedal pulses. Neuro: Grossly intact, nonfocal.  Lab Results: Basic Metabolic Panel:  Basename 08/24/11 0520 08/23/11 0520  NA 141 139  K 3.4* 3.3*  CL 107 104  CO2 26 26  GLUCOSE 50* 87  BUN 11 12  CREATININE 0.72 0.74  CALCIUM 8.9 8.6  MG -- --  PHOS -- --   Liver Function Tests:  Basename 08/23/11 0520  AST 9  ALT 6  ALKPHOS 71  BILITOT 0.3  PROT 5.4*  ALBUMIN 2.6*   No results found for this basename: LIPASE:2,AMYLASE:2 in the last 72 hours No results found for this basename: AMMONIA:2 in the last 72 hours CBC:  Basename 08/23/11 0520 08/22/11 1438  WBC 6.2 5.1  NEUTROABS -- 2.7  HGB 10.6* 12.0  HCT 31.4* 35.9*  MCV 86.0 86.9  PLT 294 322   Cardiac Enzymes:  Basename 08/22/11 1438  CKTOTAL --  CKMB --  CKMBINDEX --  TROPONINI <0.30   BNP: No results found for this basename: PROBNP:3 in the last 72 hours D-Dimer: No results found for this basename: DDIMER:2 in the last 72 hours CBG:  Basename 08/24/11 0745 08/24/11 0637 08/24/11  0611 08/24/11 0535 08/23/11 2144 08/23/11 2047  GLUCAP 146* 71 58* 48* 86 68*   Hemoglobin A1C:  Basename 08/22/11 1742  HGBA1C 19.0*   Fasting Lipid Panel: No results found for this basename: CHOL,HDL,LDLCALC,TRIG,CHOLHDL,LDLDIRECT in the last 72 hours Thyroid Function Tests: No results found for this basename: TSH,T4TOTAL,FREET4,T3FREE,THYROIDAB in the last 72 hours Anemia Panel: No results found for this basename: VITAMINB12,FOLATE,FERRITIN,TIBC,IRON,RETICCTPCT in the last 72 hours Coagulation: No results found for this basename: LABPROT:2,INR:2 in the last 72 hours Urine Drug Screen: Drugs of Abuse  No results found for this basename: labopia,  cocainscrnur,  labbenz,  amphetmu,  thcu,  labbarb    Alcohol Level: No results found for this basename: ETH:2 in the last 72 hours Urinalysis:  Basename 08/22/11 1450  COLORURINE YELLOW  LABSPEC 1.034*  PHURINE 5.5  GLUCOSEU >1000*  HGBUR NEGATIVE  BILIRUBINUR NEGATIVE  KETONESUR NEGATIVE  PROTEINUR NEGATIVE  UROBILINOGEN 0.2  NITRITE NEGATIVE  LEUKOCYTESUR NEGATIVE    No results found for this or any previous visit (from the past 240 hour(s)).  Studies/Results: Dg Chest 2 View  08/22/2011  *RADIOLOGY REPORT*  Clinical Data: Weakness and shortness of breath.  CHEST - 2 VIEW  Comparison: None  Findings: The heart is borderline enlarged.  There is tortuosity and calcification of the thoracic aorta.  Surgical changes from bypass surgery are noted.  No acute pulmonary findings.  No pleural effusion or  pneumothorax.  The bony thorax is intact.  IMPRESSION: No acute cardiopulmonary findings.  Original Report Authenticated By: P. Loralie Champagne, M.D.    Medications: Scheduled Meds:    . aspirin  325 mg Oral Daily  . atenolol  50 mg Oral Daily  . clopidogrel  75 mg Oral Q breakfast  . dextrose      . enoxaparin  40 mg Subcutaneous Q24H  . fenofibrate  160 mg Oral Daily  . influenza  inactive virus vaccine  0.5 mL  Intramuscular Tomorrow-1000  . insulin aspart  0-9 Units Subcutaneous TID WC  . insulin aspart protamine-insulin aspart  20 Units Subcutaneous BID WC  . lisinopril  10 mg Oral Daily  . metFORMIN  500 mg Oral Q breakfast  . simvastatin  10 mg Oral q1800  . DISCONTD: enoxaparin  30 mg Subcutaneous Q24H  . DISCONTD: insulin aspart  0-15 Units Subcutaneous TID WC  . DISCONTD: insulin aspart protamine-insulin aspart  30 Units Subcutaneous BID WC  . DISCONTD: metFORMIN  500 mg Oral BID WC   Continuous Infusions:    . DISCONTD: sodium chloride Stopped (08/23/11 1004)   PRN Meds:.acetaminophen, acetaminophen, nitroGLYCERIN, ondansetron (ZOFRAN) IV, ondansetron  Assessment/Plan: 1. uncontrolled hyperglycemia without evidence of DKA -improved 2. Medication noncompliance-STOPPLED all meds 6mos ago 3. Diabetes mellitus previously on insulin  4. CAD status post CABG in 2005 a cardiac cath in 2008 with occluded grafts : Supposed to be on maximal medical management.  5. intermittent chronic angina  6. Chronic Systolic CHF 7. Hypoglycemia event this am Decrease Insulin dose, 20Units BID of 70/30 metformin twice a day.  hemoglobin A1c 19 Compliance and cost management will be challenges.  Willl consult diabetic coordinator, case manager and clinical social worker to assist with the situation.  In terms of her CAD/CABG with occluded grafts: Suspect her intermittent angina is secondary to this, resume aspirin, Plavix, Coreg, nitroglycerin when necessary.  ECHO with systolic CHF, EF of 35%, stable, continue Atenolol/Lisinopril, stable now DVT prophylaxis with Lovenox  To FU with Dr.Pandey at [facility]  Dispo: home tomorrow      LOS: 2 days   [facility] Pager: [phone] 08/24/2011, 12:06 PM

## 2011-08-24 NOTE — Progress Notes (Signed)
08-24-11 Spoke with Ms Deyarmin and her nephew extensively today regarding medication assistance. Patient does not have Medicare Part D- prescription benefits. She does qualify for indigent funds thru [employer]	[place]. She will need 2 sets of prescriptions upon dc with one being sent by the CM. The medications that are on the 4 dollar list will be most helpful to the patient. This CM provided the MD a list of the 4 dollar medications from [facility]. Gave patient and nephew a copy of the 4 dollar [facility] pharmacy list. Gave her a Plavix discount card. Encouraged her to call and activate Plavix card as well. Metformin is on 4 dollar list, lisinopril is 4 dollars, 70/30 insulin is 24.88 dollars, prevastatin (cholesterol) is also on the 4 dollar [facility] list. Gave web address for www.needymeds.com and also encouraged patient apply for Medicaid. Generic plavix 58.62. The total cost of her medications as a self pay patient at [facility] is 69.88 dollars. Made nephew and patient aware of the "estimated" out of pocket costs as this CM is not certain of the meds patient is definitely going to be discharged on. However, these are the meds she is on in the hospital. She will f/u with Dr. Glade Lloyd on 08-27-11 as her PCP.   [place], [place] [phone]

## 2011-08-24 NOTE — Progress Notes (Signed)
CBG: 58  Treatment: 15 GM carbohydrate snack  Symptoms: None  Follow-up CBG: RUEA:[id number] CBG Result:71  Possible Reasons for Event: Medication regimen: Metformin and Novolog 70/30  Comments/MD notified:Dr Betti Cruz paged and notified of events    Lestine Box

## 2011-08-25 DIAGNOSIS — I5022 Chronic systolic (congestive) heart failure: Secondary | ICD-10-CM | POA: Diagnosis present

## 2011-08-25 DIAGNOSIS — Z9119 Patient's noncompliance with other medical treatment and regimen: Secondary | ICD-10-CM

## 2011-08-25 LAB — GLUCOSE, CAPILLARY
Glucose-Capillary: 241 mg/dL — ABNORMAL HIGH (ref 70–99)
Glucose-Capillary: 154 mg/dL — ABNORMAL HIGH (ref 70–99)

## 2011-08-25 MED ORDER — ATENOLOL 50 MG PO TABS: 50.0000 mg | ORAL_TABLET | Freq: Every day | ORAL | Status: DC

## 2011-08-25 MED ORDER — LISINOPRIL 10 MG PO TABS: 10.0000 mg | ORAL_TABLET | Freq: Every day | ORAL | Status: DC

## 2011-08-25 MED ORDER — METFORMIN HCL ER (MOD) 500 MG PO TB24: 500.0000 mg | ORAL_TABLET | Freq: Two times a day (BID) | ORAL | Status: DC

## 2011-08-25 MED ORDER — INSULIN ASPART PROT & ASPART (70-30 MIX) 100 UNIT/ML ~~LOC~~ SUSP: 20.0000 [IU] | Freq: Two times a day (BID) | SUBCUTANEOUS | Status: DC

## 2011-08-25 MED ORDER — CLOPIDOGREL BISULFATE 75 MG PO TABS: 75.0000 mg | ORAL_TABLET | Freq: Every day | ORAL | Status: DC

## 2011-08-25 MED ORDER — PRAVASTATIN SODIUM 40 MG PO TABS: 40.0000 mg | ORAL_TABLET | Freq: Every day | ORAL | Status: DC

## 2011-08-25 MED ORDER — METFORMIN HCL ER (MOD) 500 MG PO TB24: 500.0000 mg | ORAL_TABLET | Freq: Every day | ORAL | Status: DC

## 2011-08-25 NOTE — Progress Notes (Signed)
Patient does not have IV access.  Discharged anticipated and MD aware.

## 2011-08-25 NOTE — Progress Notes (Signed)
Cm spoke with pt concerning Cm consult for medication assistance. Pt qualifies for indigent funds. Pt given assistance with 3 day supply of plavix, and vial of 70/30 insulin. Other drugs pt discharged home on generic list, pt has pre-consulted total of meds provided by weekday CM. Pt re-instructed to activate Plavix card, MD called rx into pt's pharmacy. Pt agrees with previous dc plan. Spouse at bedside appreciative of assistance.  Leonie Green [phone]

## 2011-08-25 NOTE — Progress Notes (Signed)
Physical Therapy Note  Order received. Chart reviewed. Spoke briefly with pt who denies any need for PT services. Pt awaiting discharge at this time. PT will sign off. Thanks.   Rebeca Alert, PT  [phone]

## 2011-09-04 ENCOUNTER — Other Ambulatory Visit: Payer: Self-pay | Admitting: Internal Medicine

## 2011-09-04 DIAGNOSIS — Z780 Asymptomatic menopausal state: Secondary | ICD-10-CM

## 2011-09-04 DIAGNOSIS — Z1231 Encounter for screening mammogram for malignant neoplasm of breast: Secondary | ICD-10-CM

## 2011-09-05 NOTE — Discharge Summary (Signed)
Physician Discharge Summary  Patient ID: Mckenzie Williamson MRN: [id number] DOB/AGE: [birth year] 78 y.o.  Admit date: 08/22/2011 Discharge date: 09/05/2011  Primary Care Physician:  New PCP-Dr. Glade Lloyd at [facility] care Cardiologist Dr. Peter Swaziland  Discharge Diagnoses:   1. severe uncontrolled hyperglycemia 2. medication noncompliance (stopped on medication 6 months prior to admission) 3. diabetes mellitus restarted on insulin 4. CAD status post CABG in 2005 with cath in 2008 revealing occluded grafts  5. chronic systolic CHF EF 30-35% 6. chronic intermittent angina 7. profound dehydration  Medication List  As of 09/05/2011  4:07 PM   STOP taking these medications         carvedilol 12.5 MG tablet      fenofibrate 160 MG tablet      niacin 500 MG CR tablet      olmesartan 40 MG tablet         TAKE these medications         aspirin 325 MG tablet   Take 325 mg by mouth daily.      atenolol 50 MG tablet   Commonly known as: TENORMIN   Take 1 tablet (50 mg total) by mouth daily.      clopidogrel 75 MG tablet   Commonly known as: PLAVIX   Take 1 tablet (75 mg total) by mouth daily.      clopidogrel 75 MG tablet   Commonly known as: PLAVIX   Take 1 tablet (75 mg total) by mouth daily with breakfast.      Fenofibrate 150 MG Caps   Take 160 mg by mouth daily.      insulin aspart protamine-insulin aspart (70-30) 100 UNIT/ML injection   Commonly known as: NOVOLOG 70/30   Inject 20 Units into the skin 2 (two) times daily with a meal.      lisinopril 10 MG tablet   Commonly known as: PRINIVIL,ZESTRIL   Take 1 tablet (10 mg total) by mouth daily.      metFORMIN 500 MG (MOD) 24 hr tablet   Commonly known as: GLUMETZA   Take 1 tablet (500 mg total) by mouth daily with breakfast.      multivitamin tablet   Take 1 tablet by mouth daily.      nitroGLYCERIN 0.4 MG SL tablet   Commonly known as: NITROSTAT   Place 1 tablet (0.4 mg total) under the tongue every  5 (five) minutes as needed for chest pain.      pravastatin 40 MG tablet   Commonly known as: PRAVACHOL   Take 1 tablet (40 mg total) by mouth daily.             Disposition and Follow-up:  New PCP Dr.Pandey in 1 week  Consults: Case manager nurse and clinical social workers  2-D echocardiogram 3/28 Study Conclusions: - Left ventricle: The cavity size was normal. Wall thickness was normal. Systolic function was moderately to severely reduced. The estimated ejection fraction was in the range of 30% to 35%. Diffuse hypokinesis. Doppler parameters are consistent with abnormal left ventricular relaxation (grade 1 diastolic dysfunction). - Mitral valve: Mild regurgitation. - Left atrium: The atrium was mildly to moderately dilated   Brief H and P:HPI:  This is a 78 year old African American female with history of CAD status post CABG, diabetes, hypertension and dyslipidemia stop taking her medicines or 6 months ago due to issues related to cost. Since then she has noted increase weakness, weight loss of approximately 40 pounds over the last 6  months, polyuria polydipsia and polyphagia. Patient reports that she stopped taking her insulin 6 months ago and gradually stopped her other medicines too.  In addition she also complains of intermittent sternal chest discomfort, which he describes as pressure-like off and on for the last 3-4 months  Upon evaluation in the emergency room she was noted to have a CBG of 660, without evidence of acidosis, and otherwise unremarkable laboratory evaluation and [facility] were consulted for further amount evaluation management.  She is treated with right IV NovoLog in the emergency room and her CBGs are down to 470  Hospital Course:  1. severe uncontrolled hyperglycemia: Did not have evidence of DKA on admission. Was profoundly dehydrated secondary to on controlled hyperglycemia for a while. She was started on insulin 7030 upon admission and  dosage was titrated through her hospital stay. Patient had stopped taking all her medications including insulin 6 months prior to admission reportedly due to concern about costs. Her hemoglobin A1c was 19 she was also started on  Metformin during this admission. Was also seen by diabetic coordinator, case managers to assist with medications. 2. CAD status post CABG in 2005. Had a cardiac cath in 2008 which showed occluded grafts she was supposed to be on maximal medical management unfortunately had stopped all her medicines for 6 months despite most of them being on the [facility] for a dollar co-pay list. Her 2-D echocardiogram did show deterioration in her ejection fraction to 30-35% however she did not exhibit evidence of fluid overload hence was not started on diuretic but was started on atenolol and lisinopril at discharge along with aspirin and generic Plavix. She was educated about salt restriction and weight monitoring and the fact that she may need to be on a diuretic in the future.  Time spent on Discharge:  Signed: Taylor Levick [facility]  09/05/2011, 4:07 PM

## 2011-09-06 ENCOUNTER — Encounter: Payer: Self-pay | Admitting: Nurse Practitioner

## 2011-09-06 ENCOUNTER — Ambulatory Visit (INDEPENDENT_AMBULATORY_CARE_PROVIDER_SITE_OTHER): Payer: Medicare Other | Admitting: Nurse Practitioner

## 2011-09-06 DIAGNOSIS — I2510 Atherosclerotic heart disease of native coronary artery without angina pectoris: Secondary | ICD-10-CM

## 2011-09-06 DIAGNOSIS — E119 Type 2 diabetes mellitus without complications: Secondary | ICD-10-CM

## 2011-09-06 DIAGNOSIS — I509 Heart failure, unspecified: Secondary | ICD-10-CM

## 2011-09-06 DIAGNOSIS — I5020 Unspecified systolic (congestive) heart failure: Secondary | ICD-10-CM

## 2011-09-06 DIAGNOSIS — R0609 Other forms of dyspnea: Secondary | ICD-10-CM

## 2011-09-06 DIAGNOSIS — R0989 Other specified symptoms and signs involving the circulatory and respiratory systems: Secondary | ICD-10-CM

## 2011-09-06 DIAGNOSIS — R0600 Dyspnea, unspecified: Secondary | ICD-10-CM

## 2011-09-06 LAB — BASIC METABOLIC PANEL
BUN: 17 mg/dL (ref 6–23)
CO2: 26 mEq/L (ref 19–32)
Calcium: 9.1 mg/dL (ref 8.4–10.5)
Chloride: 105 mEq/L (ref 96–112)
Creatinine, Ser: 0.7 mg/dL (ref 0.4–1.2)
GFR: 102.6 mL/min (ref >60.00)
Glucose, Bld: 173 mg/dL — ABNORMAL HIGH (ref 70–99)
Potassium: 4.1 mEq/L (ref 3.5–5.1)
Sodium: 139 mEq/L (ref 135–145)

## 2011-09-06 LAB — BRAIN NATRIURETIC PEPTIDE: Pro B Natriuretic peptide (BNP): 132 pg/mL — ABNORMAL HIGH (ref 0.0–100.0)

## 2011-09-06 NOTE — Assessment & Plan Note (Signed)
She says she is too scared to give herself the insulin. Says she has no PCP. We have found that she is actually seeing Dr. Sander Radon at [facility]. Care and has planned diabetic follow up with Ronal Fear next Monday. She is strongly encouraged to take her medicines and keep her follow up appointments.

## 2011-09-06 NOTE — Progress Notes (Signed)
[name] Date of Birth: 1932/07/16 Medical Record #[id number]  History of Present Illness: [name] is seen today for a follow up visit. She is seen for Dr. Swaziland. It is listed as a one month check but she has not been here since September. She has multiple medical issues which include CAD with prior CABG back in 2005. She has had repeat cath back in 2008 showing that all of her vein grafts are occluded. She has diabetes, HTN and HLD. She has had long standing noncompliance.   She comes in today. She is here with her husband. They are both very poor historians. Apparently, she has stopped all of her medicines again. She says she was "juicing". Ended up with what sounds like DKA back in March and was at [facility]. Now back on some medicines. She says she does not have a primary care and that Dr. Swaziland is "suppose to take care of everything". She does have occasional chest pains. She has some shortness of breath. Salt use is questionable. She had an echo during that last admission showing her EF to be 30 to 35%. She is now anemic as well. A1C was 19.   Current Outpatient Prescriptions on File Prior to Visit  Medication Sig Dispense Refill  . aspirin 325 MG tablet Take 325 mg by mouth daily.        Marland Kitchen atenolol (TENORMIN) 50 MG tablet Take 1 tablet (50 mg total) by mouth daily.  30 tablet  0  . clopidogrel (PLAVIX) 75 MG tablet Take 1 tablet (75 mg total) by mouth daily.  30 tablet  5  . insulin aspart protamine-insulin aspart (NOVOLOG 70/30) (70-30) 100 UNIT/ML injection Inject 20 Units into the skin 2 (two) times daily with a meal.  10 mL  5  . lisinopril (PRINIVIL,ZESTRIL) 10 MG tablet Take 1 tablet (10 mg total) by mouth daily.  30 tablet  0  . metFORMIN (GLUMETZA) 500 MG (MOD) 24 hr tablet Take 1 tablet (500 mg total) by mouth daily with breakfast.  30 tablet  0  . Multiple Vitamin (MULTIVITAMIN) tablet Take 1 tablet by mouth daily.        . pravastatin (PRAVACHOL) 40 MG  tablet Take 1 tablet (40 mg total) by mouth daily.  30 tablet  0  . Fenofibrate 150 MG CAPS Take 160 mg by mouth daily.          No Known Allergies  Past Medical History  Diagnosis Date  . Coronary artery disease     s/p CABG in 2005; last cath in 2008 showing occlusion of all SVG's;   . Diabetes mellitus   . Hyperlipidemia   . Hypertension   . Noncompliance   . LV dysfunction     EF 30 to 35% per echo 3/13    Past Surgical History  Procedure Date  . Coronary artery bypass graft 2005    THIS INCLUDED SAPHENOUS VEIN GRAFT TO THE PDA, SAPHENOUS VEIN GRAFT TO OBTUSE MARGINAL VESSEL, AS LIMA GRAFT TO THE LAD.  Marland Kitchen Cardiac catheterization 2008    EF 50-55%. SHOWED OCCLUSION OF VEIN GRAFT  . Cholecystectomy   . Transthoracic echocardiogram 05/04/2004    EF 50-55%  . Cardiovascular stress test 12/24/2006    EF 47%    History  Smoking status  . Never Smoker   Smokeless tobacco  . Never Used    History  Alcohol Use No    Family History  Problem Relation Age of Onset  .  Tuberculosis Mother   . Lung cancer Father   . Diabetes Sister   . Heart attack Brother   . Hypertension Brother   . Diabetes Brother     Review of Systems: The review of systems is per the HPI.  All other systems were reviewed and are negative.  Physical Exam: BP 144/70  Pulse 76  Ht 5\' 2"  (1.575 m)  Wt 156 lb 11.2 oz (71.079 kg)  BMI 28.66 kg/m2 Patient is alert and in no acute distress. She is a very poor historian. Skin is warm and dry. Color is normal.  HEENT is unremarkable. Normocephalic/atraumatic. PERRL. Sclera are nonicteric. Neck is supple. No masses. No JVD. Lungs are fairly clear. Cardiac exam shows a regular rate and rhythm. Abdomen is soft. Extremities are without edema. Gait and ROM are intact. No gross neurologic deficits noted.  LABORATORY DATA: Lab Results  Component Value Date   WBC 6.2 08/23/2011   HGB 10.6* 08/23/2011   HCT 31.4* 08/23/2011   PLT 294 08/23/2011   GLUCOSE 50*  08/24/2011   ALT 6 08/23/2011   AST 9 08/23/2011   NA 141 08/24/2011   K 3.4* 08/24/2011   CL 107 08/24/2011   CREATININE 0.72 08/24/2011   BUN 11 08/24/2011   CO2 26 08/24/2011   INR 0.9 10/22/2007   HGBA1C 19.0* 08/22/2011     Assessment / Plan:

## 2011-09-06 NOTE — Assessment & Plan Note (Signed)
EF is now down to 35% per echo last month. She is on low dose ACE. She is now on Atenolol but has been on Coreg in the past. Not clear as to why this was stopped. Nevertheless, the compliance issue is the most biggest concern. I am checking a BNP and BMET today. Will see what her labs look like. Will see her back in two weeks and try to start maximizing her medicines. I told her that her input into this is crucial. Unfortunately, I do not get the feeling that she has a good understanding as to the seriousness of her situation. Patient is agreeable to this plan and will call if any problems develop in the interim.

## 2011-09-06 NOTE — Patient Instructions (Addendum)
See Mckenzie Williamson, [name] at [facility] on April 15 at 9:30 See Dr. Glade Lloyd on May 9 at 11:00 for a 1 month office visit  We need to check some labs today.  For now, stay on your current medicines, but we will need to change these probably when I see you back  I will see you in about 2 weeks  -- appointment April 26 at 9:15   Call the [facility] office at [phone] if you have any questions, problems or concerns.

## 2011-09-06 NOTE — Assessment & Plan Note (Signed)
She has known CAD with remote CABG. Her vein grafts are known to be occluded. Medical management is the crux of her issue. She has long standing issues with noncompliance. Unfortunately, I see her long term prognosis as poor.

## 2011-09-18 ENCOUNTER — Other Ambulatory Visit: Payer: Medicare Other

## 2011-09-18 ENCOUNTER — Ambulatory Visit: Payer: Medicare Other

## 2011-09-21 ENCOUNTER — Encounter: Payer: Self-pay | Admitting: Nurse Practitioner

## 2011-09-21 ENCOUNTER — Ambulatory Visit (INDEPENDENT_AMBULATORY_CARE_PROVIDER_SITE_OTHER): Payer: Medicare Other | Admitting: Nurse Practitioner

## 2011-09-21 VITALS — BP 170/90 | HR 60 | Resp 18 | Ht 62.0 in | Wt 153.0 lb

## 2011-09-21 DIAGNOSIS — E119 Type 2 diabetes mellitus without complications: Secondary | ICD-10-CM

## 2011-09-21 DIAGNOSIS — I2510 Atherosclerotic heart disease of native coronary artery without angina pectoris: Secondary | ICD-10-CM

## 2011-09-21 DIAGNOSIS — I10 Essential (primary) hypertension: Secondary | ICD-10-CM

## 2011-09-21 DIAGNOSIS — I509 Heart failure, unspecified: Secondary | ICD-10-CM

## 2011-09-21 MED ORDER — CLOPIDOGREL BISULFATE 75 MG PO TABS: 75.0000 mg | ORAL_TABLET | Freq: Every day | ORAL | Status: DC

## 2011-09-21 MED ORDER — CARVEDILOL 25 MG PO TABS: 12.5000 mg | ORAL_TABLET | Freq: Two times a day (BID) | ORAL | Status: DC

## 2011-09-21 NOTE — Patient Instructions (Signed)
I am glad you are feeling better.   We are going to try and start maximizing your medicines to help with your heart pumping function  Minimize your salt  Stop the Atenolol. Start Coreg 25 mg taking just 1/2 tablet two times a day. This is at the drug store  I have refilled your Plavix  I will see you in 2 weeks.  Call the [facility] office at [phone] if you have any questions, problems or concerns.

## 2011-09-21 NOTE — Assessment & Plan Note (Signed)
Blood pressure is up. I have switched her to Coreg. Will try to increase ACE on return.

## 2011-09-21 NOTE — Assessment & Plan Note (Signed)
Says she is taking her medicines. Not checking her sugars however. Will defer to primary care.

## 2011-09-21 NOTE — Assessment & Plan Note (Signed)
She has known CAD with remote CABG and her vein grafts are known to be occluded. Medical management remains the crux of her issue with good diabetic control.

## 2011-09-21 NOTE — Progress Notes (Signed)
Mckenzie Williamson Date of Birth: [birth year] Medical Record #[id number]  History of Present Illness:  Mckenzie Williamson is seen today for a 2 week check. She is seen for Dr. Sw[place]il[place]. She has multiple medical issues which include CAD with prior CABG back in 2005. Had repeat cath back in 2008 showing that all of her vein grafts are occluded. She also has diabetes, HTN and HLD. She has had long standing noncompliance. She was here 2 weeks ago after being admitted for hyperglycemia. She had stopped all of her medicines and had been "juicing". Echo during this last admission showed an EF of 30 to 35%. She was also anemic.  She comes in today. She is here with husband. Says she is doing better. Getting stronger and doing more around the house. Denies being short of breath. No chest pain. Remains dizzy but this is chronic. No swelling. Not checking her sugars. Has ran out of her Plavix and did not know to refill. Did not take any medicines today. She did have normal renal function and a low BNP at her last check.   Current Outpatient Prescriptions on File Prior to Visit  Medication Sig Dispense Refill  . aspirin 325 MG tablet Take 325 mg by mouth daily.        . insulin aspart protamine-insulin aspart (NOVOLOG 70/30) (70-30) 100 UNIT/ML injection Inject 20 Units into the skin 2 (two) times daily with a meal.  10 mL  5  . lisinopril (PRINIVIL,ZESTRIL) 10 MG tablet Take 1 tablet (10 mg total) by mouth daily.  30 tablet  0  . Multiple Vitamin (MULTIVITAMIN) tablet Take 1 tablet by mouth daily.        . pravastatin (PRAVACHOL) 40 MG tablet Take 1 tablet (40 mg total) by mouth daily.  30 tablet  0  . DISCONTD: metFORMIN (GLUMETZA) 500 MG (MOD) 24 hr tablet Take 1 tablet (500 mg total) by mouth daily with breakfast.  30 tablet  0  . Fenofibrate 150 MG CAPS Take 160 mg by mouth daily.        Marland Kitchen DISCONTD: clopidogrel (PLAVIX) 75 MG tablet Take 1 tablet (75 mg total) by mouth daily.  30 tablet  5    No Known  Allergies  Past Medical History  Diagnosis Date  . Coronary artery disease     s/p CABG in 2005; last cath in 2008 showing occlusion of all SVG's;   . Diabetes mellitus   . Hyperlipidemia   . Hypertension   . Noncompliance   . LV dysfunction     EF 30 to 35% per echo 3/13  . Dizzy     Past Surgical History  Procedure Date  . Coronary artery bypass graft 2005    THIS INCLUDED SAPHENOUS VEIN GRAFT TO THE PDA, SAPHENOUS VEIN GRAFT TO OBTUSE MARGINAL VESSEL, AS LIMA GRAFT TO THE LAD.  Marland Kitchen Cardiac catheterization 2008    EF 50-55%. SHOWED OCCLUSION OF VEIN GRAFT  . Cholecystectomy   . Transthoracic echocardiogram 05/04/2004    EF 50-55%  . Cardiovascular stress test 12/24/2006    EF 47%    History  Smoking status  . Never Smoker   Smokeless tobacco  . Never Used    History  Alcohol Use No    Family History  Problem Relation Age of Onset  . Tuberculosis Mother   . Lung cancer Father   . Diabetes Sister   . Heart attack Brother   . Hypertension Brother   . Diabetes Brother  Review of Systems: The review of systems is positive for chronic dizziness.  All other systems were reviewed and are negative.  Physical Exam: BP 170/90  Pulse 60  Resp 18  Ht 5\' 2"  (1.575 m)  Wt 153 lb (69.4 kg)  BMI 27.98 kg/m2 Patient is pleasant and in no acute distress. Skin is warm and dry. Color is normal.  HEENT is unremarkable. Normocephalic/atraumatic. PERRL. Sclera are nonicteric. Neck is supple. No masses. No JVD. Lungs are fairly clear. Cardiac exam shows a regular rate and rhythm. Heart tones are distant. Abdomen is soft. Extremities are without edema. Gait and ROM are intact. No gross neurologic deficits noted.   LABORATORY DATA: Lab Results  Component Value Date   WBC 6.2 08/23/2011   HGB 10.6* 08/23/2011   HCT 31.4* 08/23/2011   PLT 294 08/23/2011   GLUCOSE 173* 09/06/2011   ALT 6 08/23/2011   AST 9 08/23/2011   NA 139 09/06/2011   K 4.1 09/06/2011   CL 105 09/06/2011    CREATININE 0.7 09/06/2011   BUN 17 09/06/2011   CO2 26 09/06/2011   INR 0.9 10/22/2007   HGBA1C 19.0* 08/22/2011     Assessment / Plan:

## 2011-09-21 NOTE — Assessment & Plan Note (Signed)
EF is now down to 30 to 35% per recent echo. She is on ACE. She was on Coreg in the past. Not clear why she is now on Atenolol. I have stopped the Atenolol and will put her on Coreg 12. 5 BID. She seems fairly asymptomatic. Long term compliance will be key. I will see her back in about 2 weeks. Patient is agreeable to this plan and will call if any problems develop in the interim.

## 2011-09-30 ENCOUNTER — Other Ambulatory Visit (HOSPITAL_COMMUNITY): Payer: Self-pay | Admitting: Internal Medicine

## 2011-10-01 ENCOUNTER — Encounter: Payer: Self-pay | Admitting: Nurse Practitioner

## 2011-10-01 ENCOUNTER — Ambulatory Visit (INDEPENDENT_AMBULATORY_CARE_PROVIDER_SITE_OTHER): Payer: Medicare Other | Admitting: Nurse Practitioner

## 2011-10-01 VITALS — BP 158/70 | HR 72 | Ht 62.5 in | Wt 159.0 lb

## 2011-10-01 DIAGNOSIS — Z9119 Patient's noncompliance with other medical treatment and regimen: Secondary | ICD-10-CM

## 2011-10-01 DIAGNOSIS — I10 Essential (primary) hypertension: Secondary | ICD-10-CM

## 2011-10-01 DIAGNOSIS — I2510 Atherosclerotic heart disease of native coronary artery without angina pectoris: Secondary | ICD-10-CM

## 2011-10-01 DIAGNOSIS — R0600 Dyspnea, unspecified: Secondary | ICD-10-CM

## 2011-10-01 DIAGNOSIS — R0609 Other forms of dyspnea: Secondary | ICD-10-CM

## 2011-10-01 DIAGNOSIS — I509 Heart failure, unspecified: Secondary | ICD-10-CM

## 2011-10-01 LAB — CBC
HCT: 34.6 % — ABNORMAL LOW (ref 36.0–46.0)
Hemoglobin: 11 g/dL — ABNORMAL LOW (ref 12.0–15.0)
MCHC: 32 g/dL (ref 30.0–36.0)
MCV: 89.6 fl (ref 78.0–100.0)
Platelets: 278 10*3/uL (ref 150.0–400.0)
RBC: 3.86 Mil/uL — ABNORMAL LOW (ref 3.87–5.11)
RDW: 13.4 % (ref 11.5–14.6)
WBC: 5.3 10*3/uL (ref 4.5–10.5)

## 2011-10-01 LAB — BASIC METABOLIC PANEL WITH GFR
BUN: 12 mg/dL (ref 6–23)
CO2: 24 meq/L (ref 19–32)
Calcium: 9.4 mg/dL (ref 8.4–10.5)
Chloride: 107 meq/L (ref 96–112)
Creatinine, Ser: 0.8 mg/dL (ref 0.4–1.2)
GFR: 96.3 mL/min (ref >60.00)
Glucose, Bld: 135 mg/dL — ABNORMAL HIGH (ref 70–99)
Potassium: 4.5 meq/L (ref 3.5–5.1)
Sodium: 142 meq/L (ref 135–145)

## 2011-10-01 LAB — BRAIN NATRIURETIC PEPTIDE: Pro B Natriuretic peptide (BNP): 47 pg/mL (ref 0.0–100.0)

## 2011-10-01 MED ORDER — PRAVASTATIN SODIUM 40 MG PO TABS: 40.0000 mg | ORAL_TABLET | Freq: Every day | ORAL | Status: DC

## 2011-10-01 MED ORDER — CARVEDILOL 25 MG PO TABS: 25.0000 mg | ORAL_TABLET | Freq: Two times a day (BID) | ORAL | Status: DC

## 2011-10-01 NOTE — Progress Notes (Signed)
Mckenzie Williamson Date of Birth: 1933/01/01 Medical Record #[id number]  History of Present Illness: Mckenzie Williamson is seen back today for a 2 week check. She is seen for Dr. [place]. She has multipole medical issues which include CAD with prior CABG back in 2005. Had repeat cath back in 2008 showing that all of her vein grafts are occluded. Other issues include uncontrolled DM (A1C 19), anemia, HTN and HLD. She was admitted earlier this year with uncontrolled blood sugar. She has had long standing noncompliance. EF is down to 30 to 35% per recent echo.  She comes in today. She is here alone. Says she is feeling ok. Has had a recent sinus infection. Says she is not short of breath. No swelling. No more PND/orthopnea. Her weight is up. She does not weigh at home. She was not able to get her Plavix refilled due to cost and is not going to be able to afford. She tells me that she has missed her last appointment with her PCP.   Current Outpatient Prescriptions on File Prior to Visit  Medication Sig Dispense Refill  . aspirin 325 MG tablet Take 325 mg by mouth daily.        . insulin aspart protamine-insulin aspart (NOVOLOG 70/30) (70-30) 100 UNIT/ML injection Inject 20 Units into the skin 2 (two) times daily with a meal.  10 mL  5  . lisinopril (PRINIVIL,ZESTRIL) 10 MG tablet Take 1 tablet (10 mg total) by mouth daily.  30 tablet  0  . metFORMIN (GLUMETZA) 500 MG (MOD) 24 hr tablet Take 500 mg by mouth 2 (two) times daily with a meal.      . Multiple Vitamin (MULTIVITAMIN) tablet Take 1 tablet by mouth daily.        . Vitamin D, Ergocalciferol, (DRISDOL) 50000 UNITS CAPS Take 50,000 Units by mouth every 7 (seven) days.      Marland Kitchen DISCONTD: pravastatin (PRAVACHOL) 40 MG tablet Take 1 tablet (40 mg total) by mouth daily.  30 tablet  0  . Fenofibrate 150 MG CAPS Take 160 mg by mouth daily.          No Known Allergies  Past Medical History  Diagnosis Date  . Coronary artery disease     s/p CABG in  2005; last cath in 2008 showing occlusion of all SVG's;   . Diabetes mellitus   . Hyperlipidemia   . Hypertension   . Noncompliance   . LV dysfunction     EF 30 to 35% per echo 3/13  . Dizzy     Past Surgical History  Procedure Date  . Coronary artery bypass graft 2005    THIS INCLUDED SAPHENOUS VEIN GRAFT TO THE PDA, SAPHENOUS VEIN GRAFT TO OBTUSE MARGINAL VESSEL, AS LIMA GRAFT TO THE LAD.  Marland Kitchen Cardiac catheterization 2008    EF 50-55%. SHOWED OCCLUSION OF VEIN GRAFT  . Cholecystectomy   . Transthoracic echocardiogram 05/04/2004    EF 50-55%  . Cardiovascular stress test 12/24/2006    EF 47%    History  Smoking status  . Never Smoker   Smokeless tobacco  . Never Used    History  Alcohol Use No    Family History  Problem Relation Age of Onset  . Tuberculosis Mother   . Lung cancer Father   . Diabetes Sister   . Heart attack Brother   . Hypertension Brother   . Diabetes Brother     Review of Systems: The review of systems is per  the [facility].  All other systems were reviewed and are negative.  Physical Exam: BP 158/70  Pulse 72  Ht 5' 2.5" (1.588 m)  Wt 159 lb (72.122 kg)  BMI 28.62 kg/m2 Patient is pleasant and in no acute distress. Skin is warm and dry. Color is normal.  HEENT is unremarkable. Normocephalic/atraumatic. PERRL. Sclera are nonicteric. Neck is supple. No masses. No JVD. Lungs are clear. Cardiac exam shows a regular rate and rhythm. She does have a soft S3. Abdomen is soft. Extremities are without edema. Gait and ROM are intact. No gross neurologic deficits noted.  LABORATORY DATA: PENDING   Lab Results  Component Value Date   WBC 6.2 08/23/2011   HGB 10.6* 08/23/2011   HCT 31.4* 08/23/2011   PLT 294 08/23/2011   GLUCOSE 173* 09/06/2011   ALT 6 08/23/2011   AST 9 08/23/2011   NA 139 09/06/2011   K 4.1 09/06/2011   CL 105 09/06/2011   CREATININE 0.7 09/06/2011   BUN 17 09/06/2011   CO2 26 09/06/2011   INR 0.9 10/22/2007   HGBA1C 19.0* 08/22/2011      Assessment / Plan:

## 2011-10-01 NOTE — Assessment & Plan Note (Signed)
Blood pressure is improved but still elevated. I have increased the Coreg to 25 mg BID.

## 2011-10-01 NOTE — Assessment & Plan Note (Signed)
She has known CAD with remote CABG and her vein grafts are known to be occluded. Medical management remains the crux of her issues.

## 2011-10-01 NOTE — Assessment & Plan Note (Signed)
She has missed her last appointment with her PCP. I have encouraged her to reschedule.

## 2011-10-01 NOTE — Assessment & Plan Note (Signed)
EF is down to 30 to 35% per recent echo. She is on ACE. I have increased her Coreg to 25 mg BID. We will see her back in a month. Her weight is up. We are checking labs today. I have left her other medicines as they are. She is currently asymptomatic.

## 2011-10-01 NOTE — Patient Instructions (Signed)
We are going to increase the Carvedilol to a whole tablet (25 mg) two times a day  We will just leave you off the Plavix for now.  Try to weigh each day.  We are going to check some labs today  Reschedule your appointment with your primary care so we can get your blood sugar controlled.  We will see you in a month  Call the [facility] Care office at [phone] if you have any questions, problems or concerns.

## 2011-10-05 ENCOUNTER — Observation Stay (HOSPITAL_COMMUNITY): Payer: Medicare Other

## 2011-10-05 ENCOUNTER — Observation Stay (HOSPITAL_COMMUNITY)
Admission: EM | Admit: 2011-10-05 | Discharge: 2011-10-07 | Disposition: A | Discharge status: Home / Self Care | Payer: Medicare Other | Attending: Internal Medicine | Admitting: Internal Medicine

## 2011-10-05 ENCOUNTER — Encounter (HOSPITAL_COMMUNITY): Payer: Self-pay

## 2011-10-05 DIAGNOSIS — I509 Heart failure, unspecified: Secondary | ICD-10-CM

## 2011-10-05 DIAGNOSIS — E1169 Type 2 diabetes mellitus with other specified complication: Secondary | ICD-10-CM

## 2011-10-05 DIAGNOSIS — Y92009 Unspecified place in unspecified non-institutional (private) residence as the place of occurrence of the external cause: Secondary | ICD-10-CM | POA: Insufficient documentation

## 2011-10-05 DIAGNOSIS — E162 Hypoglycemia, unspecified: Secondary | ICD-10-CM

## 2011-10-05 DIAGNOSIS — Z794 Long term (current) use of insulin: Secondary | ICD-10-CM | POA: Insufficient documentation

## 2011-10-05 DIAGNOSIS — E1165 Type 2 diabetes mellitus with hyperglycemia: Secondary | ICD-10-CM

## 2011-10-05 DIAGNOSIS — T383X5A Adverse effect of insulin and oral hypoglycemic [antidiabetic] drugs, initial encounter: Secondary | ICD-10-CM | POA: Insufficient documentation

## 2011-10-05 DIAGNOSIS — E118 Type 2 diabetes mellitus with unspecified complications: Secondary | ICD-10-CM

## 2011-10-05 DIAGNOSIS — I5022 Chronic systolic (congestive) heart failure: Secondary | ICD-10-CM | POA: Diagnosis present

## 2011-10-05 DIAGNOSIS — E119 Type 2 diabetes mellitus without complications: Secondary | ICD-10-CM | POA: Diagnosis present

## 2011-10-05 HISTORY — DX: Hypoglycemia, unspecified: E16.2

## 2011-10-05 LAB — GLUCOSE, CAPILLARY
Glucose-Capillary: 224 mg/dL — ABNORMAL HIGH (ref 70–99)
Glucose-Capillary: 39 mg/dL — CL (ref 70–99)
Glucose-Capillary: 100 mg/dL — ABNORMAL HIGH (ref 70–99)
Glucose-Capillary: 42 mg/dL — CL (ref 70–99)
Glucose-Capillary: 85 mg/dL (ref 70–99)
Glucose-Capillary: 86 mg/dL (ref 70–99)

## 2011-10-05 LAB — HEPATIC FUNCTION PANEL
AST: 10 U/L (ref 0–37)
Albumin: 3.6 g/dL (ref 3.5–5.2)
Bilirubin, Direct: <0.1 mg/dL (ref 0.0–0.3)
Total Protein: 7.2 g/dL (ref 6.0–8.3)

## 2011-10-05 LAB — CBC
MCV: 87.4 fL (ref 78.0–100.0)
Platelets: 271 10*3/uL (ref 150–400)
RBC: 3.88 MIL/uL (ref 3.87–5.11)
RDW: 13.1 % (ref 11.5–15.5)
WBC: 8.9 10*3/uL (ref 4.0–10.5)

## 2011-10-05 LAB — POCT I-STAT, CHEM 8
BUN: 17 mg/dL (ref 6–23)
Calcium, Ion: 1.34 mmol/L — ABNORMAL HIGH (ref 1.12–1.32)
Chloride: 110 meq/L (ref 96–112)
Creatinine, Ser: 0.8 mg/dL (ref 0.50–1.10)
Glucose, Bld: 47 mg/dL — ABNORMAL LOW (ref 70–99)
HCT: 35 % — ABNORMAL LOW (ref 36.0–46.0)
Hemoglobin: 11.9 g/dL — ABNORMAL LOW (ref 12.0–15.0)
Potassium: 3.9 meq/L (ref 3.5–5.1)
Sodium: 146 meq/L — ABNORMAL HIGH (ref 135–145)
TCO2: 26 mmol/L (ref 0–100)

## 2011-10-05 MED ORDER — ONDANSETRON HCL 4 MG PO TABS: 4.0000 mg | ORAL_TABLET | Freq: Four times a day (QID) | ORAL | Status: DC | PRN

## 2011-10-05 MED ORDER — GLUCAGON HCL (RDNA) 1 MG IJ SOLR
INTRAMUSCULAR | Status: AC
  Administered 2011-10-05: 1 mg
  Filled 2011-10-05: qty 1

## 2011-10-05 MED ORDER — ONDANSETRON HCL 4 MG/2ML IJ SOLN: 4.0000 mg | Freq: Four times a day (QID) | INTRAMUSCULAR | Status: DC | PRN

## 2011-10-05 MED ORDER — DEXTROSE 50 % IV SOLN
1.0000 | Freq: Once | INTRAVENOUS | Status: AC
  Administered 2011-10-05: 50 mL via INTRAVENOUS

## 2011-10-05 MED ORDER — DEXTROSE 50 % IV SOLN
25.0000 mL | Freq: Once | INTRAVENOUS | Status: AC
  Administered 2011-10-05: 25 mL via INTRAVENOUS

## 2011-10-05 MED ORDER — DEXTROSE 50 % IV SOLN
INTRAVENOUS | Status: AC
  Administered 2011-10-05: 50 mL via INTRAVENOUS
  Filled 2011-10-05: qty 50

## 2011-10-05 MED ORDER — DEXTROSE 50 % IV SOLN
INTRAVENOUS | Status: AC
  Administered 2011-10-05: 25 mL via INTRAVENOUS
  Filled 2011-10-05: qty 50

## 2011-10-05 MED ORDER — KCL IN DEXTROSE-NACL 20-5-0.45 MEQ/L-%-% IV SOLN: Freq: Once | INTRAVENOUS | Status: DC

## 2011-10-05 MED ORDER — ASPIRIN EC 81 MG PO TBEC
81.0000 mg | DELAYED_RELEASE_TABLET | Freq: Every day | ORAL | Status: DC
  Administered 2011-10-05 – 2011-10-07 (×3): 81 mg via ORAL
  Filled 2011-10-05 (×3): qty 1

## 2011-10-05 MED ORDER — ACETAMINOPHEN 325 MG PO TABS: 650.0000 mg | ORAL_TABLET | Freq: Four times a day (QID) | ORAL | Status: DC | PRN

## 2011-10-05 MED ORDER — HYDROCODONE-ACETAMINOPHEN 5-325 MG PO TABS: 1.0000 | ORAL_TABLET | ORAL | Status: DC | PRN

## 2011-10-05 MED ORDER — CARVEDILOL 25 MG PO TABS
25.0000 mg | ORAL_TABLET | Freq: Two times a day (BID) | ORAL | Status: DC
  Administered 2011-10-05 – 2011-10-07 (×4): 25 mg via ORAL
  Filled 2011-10-05 (×5): qty 1

## 2011-10-05 MED ORDER — CLOPIDOGREL BISULFATE 75 MG PO TABS
75.0000 mg | ORAL_TABLET | Freq: Every day | ORAL | Status: DC
  Administered 2011-10-07: 75 mg via ORAL
  Filled 2011-10-05 (×2): qty 1

## 2011-10-05 MED ORDER — DEXTROSE-NACL 5-0.45 % IV SOLN: INTRAVENOUS | Status: DC

## 2011-10-05 MED ORDER — SIMVASTATIN 5 MG PO TABS
5.0000 mg | ORAL_TABLET | Freq: Every day | ORAL | Status: DC
  Administered 2011-10-06: 5 mg via ORAL
  Filled 2011-10-05 (×2): qty 1

## 2011-10-05 MED ORDER — ACETAMINOPHEN 650 MG RE SUPP: 650.0000 mg | Freq: Four times a day (QID) | RECTAL | Status: DC | PRN

## 2011-10-05 MED ORDER — DEXTROSE-NACL 5-0.45 % IV SOLN: 50.0000 mL/h | INTRAVENOUS | Status: AC

## 2011-10-05 NOTE — ED Notes (Signed)
CBG: 39 

## 2011-10-05 NOTE — ED Notes (Addendum)
Pt here by ems unresponsive on their arrival at home, hypoglycemic withcbg of 14 unable to gain iv access given glucagon im and repeat sugar is 48. Pt is responsive now to painful stimuli.

## 2011-10-05 NOTE — ED Notes (Signed)
5001-01 Ready 

## 2011-10-05 NOTE — Progress Notes (Signed)
In to replace peripheral IV. Patient currently has peripheral external jugular IV.Patient refusing any attempts at this time. I advised the patient of the need for IV and she continues to refuse further attempts at this time. Patient stated if she changes her mind she will let her RN know. Primary RN notified and aware.

## 2011-10-05 NOTE — ED Notes (Signed)
Pt responding to verbal stimuli. Oriented to place and person. Pt denies pain and states "I can't remember what happened." Son is in room at bed side, son states pt had similar episode about a month ago and was brought to ED. Pt had appt to see PCP yesterday but unable to go to the appointment.

## 2011-10-05 NOTE — ED Notes (Signed)
Pt ate 100% of meal tray from dietary. Chicken, 2 veggies and dessert.

## 2011-10-05 NOTE — H&P (Signed)
[facility] History and Physical  Mckenzie Williamson ZOX:[id number] DOB: 08/13/1932 DOA: 10/05/2011   PCP: Sheila Oats, MD, MD   Chief Complaint: Patient brought by family after hypoglycemia and lost of consciousness.   HPI: 78 year old with PMH significant for Diabetes on 70/30, CAR s/p CABG in 2005, HTN who was brought by family due to hypoglycemia episode. Per ED physician patient blood sugar on arrival to ED was at 14, she was no responsive, she received D 50 and became alert. Patient during my evaluation was awake, oriented to person and place. She only remember that she had breakfast,  oatmeal and orange juice. She  took her insulin and metformin and then she was feeling week and tired and she doesn't remmember anything else. Her son found her unresponsive and EMS was called. Patient and family relates 2 others hypoglycemia episodes recently. Episodes usually occurs in the morning. She relates that she is drinking less sodas.    Review of Systems:  Constitutional:  No weight loss, night sweats, Fevers, chills, fatigue.  HEENT:  No headaches, Difficulty swallowing,Tooth/dental problems,Sore throat,  No sneezing, itching, ear ache, nasal congestion, post nasal drip,  Cardio-vascular:  No chest pain, Orthopnea, PND, swelling in lower extremities, anasarca, dizziness, palpitations  GI:  No heartburn, indigestion, abdominal pain, nausea, vomiting, diarrhea, change in bowel habits, loss of appetite  Resp:  No shortness of breath with exertion or at rest. No excess mucus, no productive cough, No non-productive cough, No coughing up of blood.No change in color of mucus.No wheezing.No chest wall deformity  Skin:  no rash or lesions.  GU:  no dysuria, change in color of urine, no urgency or frequency. No flank pain.  Musculoskeletal:  No joint pain or swelling. No decreased range of motion. No back pain.  Psych:  No change in mood or affect. No depression or  anxiety. No memory loss.    Past Medical History  Diagnosis Date  . Coronary artery disease     s/p CABG in 2005; last cath in 2008 showing occlusion of all SVG's;   . Diabetes mellitus   . Hyperlipidemia   . Hypertension   . Noncompliance   . LV dysfunction     EF 30 to 35% per echo 3/13  . Dizzy    Past Surgical History  Procedure Date  . Coronary artery bypass graft 2005    THIS INCLUDED SAPHENOUS VEIN GRAFT TO THE PDA, SAPHENOUS VEIN GRAFT TO OBTUSE MARGINAL VESSEL, AS LIMA GRAFT TO THE LAD.  Marland Kitchen Cardiac catheterization 2008    EF 50-55%. SHOWED OCCLUSION OF VEIN GRAFT  . Cholecystectomy   . Transthoracic echocardiogram 05/04/2004    EF 50-55%  . Cardiovascular stress test 12/24/2006    EF 47%   Social History:  reports that she has never smoked. She has never used smokeless tobacco. She reports that she does not drink alcohol or use illicit drugs.  No Known Allergies  Family History  Problem Relation Age of Onset  . Tuberculosis Mother   . Lung cancer Father   . Diabetes Sister   . Heart attack Brother   . Hypertension Brother   . Diabetes Brother     Prior to Admission medications   Medication Sig Start Date End Date Taking? Authorizing Provider  aspirin 325 MG tablet Take 325 mg by mouth daily.     Yes Historical Provider, MD  carvedilol (COREG) 25 MG tablet Take 25 mg by mouth 2 (two) times daily. 10/01/11 09/30/12  Yes Rosalio Macadamia, NP  clopidogrel (PLAVIX) 75 MG tablet Take 75 mg by mouth daily.   Yes Historical Provider, MD  insulin aspart protamine-insulin aspart (NOVOLOG 70/30) (70-30) 100 UNIT/ML injection Inject 20 Units into the skin 2 (two) times daily with a meal. 08/25/11 08/24/12 Yes Zannie Cove, MD  lisinopril (PRINIVIL,ZESTRIL) 10 MG tablet Take 10 mg by mouth daily. 08/25/11 08/24/12 Yes Zannie Cove, MD  metFORMIN (GLUMETZA) 500 MG (MOD) 24 hr tablet Take 500 mg by mouth 2 (two) times daily with a meal.   Yes Historical Provider, MD  Multiple  Vitamin (MULTIVITAMIN) tablet Take 1 tablet by mouth daily.     Yes Historical Provider, MD  pravastatin (PRAVACHOL) 40 MG tablet Take 40 mg by mouth daily. 10/01/11  Yes Rosalio Macadamia, NP  Vitamin D, Ergocalciferol, (DRISDOL) 50000 UNITS CAPS Take 50,000 Units by mouth every 7 (seven) days.   Yes Historical Provider, MD  Fenofibrate 150 MG CAPS Take 160 mg by mouth daily.   02/09/11 02/13/11  Peter M Swazil[place], MD   Physical Exam: Filed Vitals:   10/05/11 1233 10/05/11 1427 10/05/11 1430 10/05/11 1500  BP: 144/47 118/65 123/57 102/55  Pulse: 64 65 65 68  Temp: 97.9 F (36.6 C)     TempSrc: Oral     Resp: 16 20 19 17   SpO2: 100% 100% 100% 99%   BP 102/55  Pulse 68  Temp(Src) 97.9 F (36.6 C) (Oral)  Resp 17  SpO2 99%  General Appearance:    Alert, cooperative, no distress, appears stated age  Head:    Normocephalic, without obvious abnormality, atraumatic  Eyes:    PERRL, conjunctiva/corneas clear, EOM's intact, fundi    benign, both eyes  Ears:    Normal TM's and external ear canals, both ears  Nose:   Nares normal, septum midline, mucosa normal, no drainage    or sinus tenderness  Throat:   Lips, mucosa, and tongue normal; teeth and gums normal  Neck:   Supple, symmetrical, trachea midline, no adenopathy;    thyroid:  no enlargement/tenderness/nodules; no carotid   bruit or JVD     Lungs:     Clear to auscultation bilaterally, respirations unlabored      Heart:    Regular rate and rhythm, S1 and S2 normal, no murmur, rub   or gallop     Abdomen:     Soft, non-tender, bowel sounds active all four quadrants,    no masses, no organomegaly        Extremities:   Extremities normal, atraumatic, no cyanosis or edema  Pulses:   2+ and symmetric all extremities  Skin:   Skin color, texture, turgor normal, no rashes or lesions  Lymph nodes:   Cervical, supraclavicular, and axillary nodes normal  Neurologic:   CNII-XII intact, normal strength, sensation and reflexes    throughout     Labs on Admission:  Basic Metabolic Panel:  Lab 10/05/11 6213 10/01/11 1010  NA 146* 142  K 3.9 4.5  CL 110 107  CO2 -- 24  GLUCOSE 47* 135*  BUN 17 12  CREATININE 0.80 0.8  CALCIUM -- 9.4  MG -- --  PHOS -- --   CBC:  Lab 10/05/11 1312 10/05/11 1230 10/01/11 1010  WBC 8.9 -- 5.3  NEUTROABS -- -- --  HGB 11.1* 11.9* 11.0*  HCT 33.9* 35.0* 34.6*  MCV 87.4 -- 89.6  PLT 271 -- 278.0   CBG:  Lab 10/05/11 1557 10/05/11 1320 10/05/11 1231  10/05/11 1225  GLUCAP 42* 100* 224* 39*    Radiological Exams on Admission: No results found.    Assessment/Plan: Patient Active Hospital Problem List:  Hypoglycemia (10/05/2011) Patient is modifying her diet, she is trying to drink less sodas. Her 70/30 regimen will need to be adjusted. We can consider Lantus instead of 70/30. I will check CBG Q 2 hr. Patient blood sugar decrease again in the ED to 42. Continue with D5 1/2 NS for 12 hour.  Diabetes mellitus () I will hold 70/30, metformin due to hypoglycemia. Patient is eating less. Will need to adjust her regimen prior to discharge.  I will check HB -A1c.   CHF, chronic // CAD:  Monitor for sign of pulmonary edema. Continue with Coreg, pravastatin, plavix.  Cough: check Chest x ray.        Hartley Barefoot, MD  [facility] Pager [phone]  If 7PM-7AM, please contact night-coverage [email or web address] Password [facility] 10/05/2011, 4:49 PM

## 2011-10-05 NOTE — ED Provider Notes (Addendum)
History     CSN: [id number]  Arrival date & time 10/05/11  1221   First MD Initiated Contact with Patient 10/05/11 1232      Chief Complaint  Patient presents with  . Hypoglycemia   Level V caveatevel 5 caveat,pt unresponsineL (Consider location/radiation/quality/duration/timing/severity/associated sxs/prior treatment) HPI Patient found by her son this morning responsive. EMS noted patient's blood sugar to be 14. Treated with intramuscular glucagon. Patient became mildly combative in the field after treatment with glucagon.EMS was unable to establish IV access  Past Medical History  Diagnosis Date  . Coronary artery disease     s/p CABG in 2005; last cath in 2008 showing occlusion of all SVG's;   . Diabetes mellitus   . Hyperlipidemia   . Hypertension   . Noncompliance   . LV dysfunction     EF 30 to 35% per echo 3/13  . Dizzy     Past Surgical History  Procedure Date  . Coronary artery bypass graft 2005    THIS INCLUDED SAPHENOUS VEIN GRAFT TO THE PDA, SAPHENOUS VEIN GRAFT TO OBTUSE MARGINAL VESSEL, AS LIMA GRAFT TO THE LAD.  Marland Kitchen Cardiac catheterization 2008    EF 50-55%. SHOWED OCCLUSION OF VEIN GRAFT  . Cholecystectomy   . Transthoracic echocardiogram 05/04/2004    EF 50-55%  . Cardiovascular stress test 12/24/2006    EF 47%    Family History  Problem Relation Age of Onset  . Tuberculosis Mother   . Lung cancer Father   . Diabetes Sister   . Heart attack Brother   . Hypertension Brother   . Diabetes Brother     History  Substance Use Topics  . Smoking status: Never Smoker   . Smokeless tobacco: Never Used  . Alcohol Use: No    OB History    Grav Para Term Preterm Abortions TAB SAB Ect Mult Living                  Review of Systems  Unable to perform ROS: Mental status change    Allergies  Review of patient's allergies indicates no known allergies.  Home Medications   Current Outpatient Rx  Name Route Sig Dispense Refill  . ASPIRIN 325 MG PO  TABS Oral Take 325 mg by mouth daily.      Marland Kitchen CARVEDILOL 25 MG PO TABS Oral Take 1 tablet (25 mg total) by mouth 2 (two) times daily. 60 tablet 6  . FENOFIBRATE 150 MG PO CAPS Oral Take 160 mg by mouth daily.      . INSULIN ASPART PROT & ASPART (70-30) 100 UNIT/ML [place] SUSP Subcutaneous Inject 20 Units into the skin 2 (two) times daily with a meal. 10 mL 5  . LISINOPRIL 10 MG PO TABS Oral Take 1 tablet (10 mg total) by mouth daily. 30 tablet 0  . METFORMIN HCL ER (MOD) 500 MG PO TB24 Oral Take 500 mg by mouth 2 (two) times daily with a meal.    . ONE-DAILY MULTI VITAMINS PO TABS Oral Take 1 tablet by mouth daily.      Marland Kitchen PRAVASTATIN SODIUM 40 MG PO TABS Oral Take 1 tablet (40 mg total) by mouth daily. 30 tablet 5  . VITAMIN D (ERGOCALCIFEROL) 50000 UNITS PO CAPS Oral Take 50,000 Units by mouth every 7 (seven) days.      BP 144/47  Pulse 64  Temp(Src) 97.9 F (36.6 C) (Oral)  Resp 16  SpO2 100%  Physical Exam  Nursing note and vitals  reviewed. Constitutional: She appears well-developed and well-nourished. She appears distressed.       Responds to noxious stimulus with nonpurposeful movement  HENT:  Head: Normocephalic and atraumatic.  Eyes: Conjunctivae are normal. Pupils are equal, round, and reactive to light.  Neck: Neck supple. No tracheal deviation present. No thyromegaly present.  Cardiovascular: Normal rate and regular rhythm.   No murmur heard. Pulmonary/Chest: Effort normal and breath sounds normal.  Abdominal: Soft. Bowel sounds are normal. She exhibits no distension. There is no tenderness.  Musculoskeletal: Normal range of motion. She exhibits no edema and no tenderness.  Neurological: She is alert. Coordination abnormal.  Skin: Skin is warm and dry. No rash noted.  Psychiatric: She has a normal mood and affect.    ED Course  Procedures (including critical care time) Nursing unable to establish peripheral IV access  Angiocath insertion Performed by:  Doug Sou  Consent: Verbal consent obtained. Risks and benefits: risks, benefits and alternatives were discussed Time out: Immediately prior to procedure a "time out" was called to verify the correct patient, procedure, equipment, support staff and site/side marked as required.  Preparation: Patientwas prepped and draped in the usual sterile fashion.  Vein Location: right external jugular vein  Gauge: 20 after IV access established patient became alert  Normal blood return and flush without difficulty Patient tolerance: Patient tolerated the procedure well with no immediate complications.   Labs Reviewed  GLUCOSE, CAPILLARY - Abnormal; Notable for the following:    Glucose-Capillary 39 (*)    All other components within normal limits  GLUCOSE, CAPILLARY - Abnormal; Notable for the following:    Glucose-Capillary 224 (*)    All other components within normal limits   No results found.  Results for orders placed during the hospital encounter of 10/05/11  GLUCOSE, CAPILLARY      Component Value Range   Glucose-Capillary 39 (*) 70 - 99 (mg/dL)   Comment 1 Notify RN    GLUCOSE, CAPILLARY      Component Value Range   Glucose-Capillary 224 (*) 70 - 99 (mg/dL)  CBC      Component Value Range   WBC 8.9  4.0 - 10.5 (K/uL)   RBC 3.88  3.87 - 5.11 (MIL/uL)   Hemoglobin 11.1 (*) 12.0 - 15.0 (g/dL)   HCT 40.9 (*) 81.1 - 46.0 (%)   MCV 87.4  78.0 - 100.0 (fL)   MCH 28.6  26.0 - 34.0 (pg)   MCHC 32.7  30.0 - 36.0 (g/dL)   RDW 91.4  78.2 - 95.6 (%)   Platelets 271  150 - 400 (K/uL)  POCT I-STAT, CHEM 8      Component Value Range   Sodium 146 (*) 135 - 145 (mEq/L)   Potassium 3.9  3.5 - 5.1 (mEq/L)   Chloride 110  96 - 112 (mEq/L)   BUN 17  6 - 23 (mg/dL)   Creatinine, Ser 2.13  0.50 - 1.10 (mg/dL)   Glucose, Bld 47 (*) 70 - 99 (mg/dL)   Calcium, Ion 0.86 (*) 1.12 - 1.32 (mmol/L)   TCO2 26  0 - 100 (mmol/L)   Hemoglobin 11.9 (*) 12.0 - 15.0 (g/dL)   HCT 57.8 (*) 46.9 - 46.0  (%)  GLUCOSE, CAPILLARY      Component Value Range   Glucose-Capillary 100 (*) 70 - 99 (mg/dL)  GLUCOSE, CAPILLARY      Component Value Range   Glucose-Capillary 42 (*) 70 - 99 (mg/dL)   No results found.  No diagnosis  found.  after iv access establisheded and D50  Administered , pt became alert awake followed simple commands moves all extremities.  gcs 14 , 1 off for eye opening, opens eyes to verbal stimuli Meal ordered for pt At 4 PM patient has been an entire meal she is alert talkative Glasgow Coma Score 15 states she feels well. Repeat CBG obtained at 1557 PM was 42, consistent with hypoglycemia after treatment with one amp of D50. Additional D50 ordered and patient started on intravenous drip of D5 half-normal saline order to 100 mL per hour MDM  In light of recurrent hypoglycemia will place on 23 hour observation IV of D5 half-normal saline 100 mL per hour Spoke with Dr. Sunnie Nielsen who will take over care Diagnosis hypoglycemia        Doug Sou, MD 10/05/11 1612  Doug Sou, MD 10/05/11 [phone]

## 2011-10-05 NOTE — ED Notes (Signed)
cbg 104

## 2011-10-05 NOTE — ED Notes (Addendum)
Pt CBG 42. Pt sitting up on stretcher talking alert/oriented x4. States she does not feel like her BS is low. States she usually gets cold and diaphoretic when she is low. 25ml D50 given as ordered. Family at bed side.

## 2011-10-06 DIAGNOSIS — E1169 Type 2 diabetes mellitus with other specified complication: Secondary | ICD-10-CM

## 2011-10-06 DIAGNOSIS — I509 Heart failure, unspecified: Secondary | ICD-10-CM

## 2011-10-06 DIAGNOSIS — E118 Type 2 diabetes mellitus with unspecified complications: Secondary | ICD-10-CM

## 2011-10-06 DIAGNOSIS — E1165 Type 2 diabetes mellitus with hyperglycemia: Secondary | ICD-10-CM

## 2011-10-06 DIAGNOSIS — IMO0002 Reserved for concepts with insufficient information to code with codable children: Secondary | ICD-10-CM

## 2011-10-06 LAB — GLUCOSE, CAPILLARY
Glucose-Capillary: 38 mg/dL — CL (ref 70–99)
Glucose-Capillary: 70 mg/dL (ref 70–99)
Glucose-Capillary: 125 mg/dL — ABNORMAL HIGH (ref 70–99)
Glucose-Capillary: 161 mg/dL — ABNORMAL HIGH (ref 70–99)
Glucose-Capillary: 158 mg/dL — ABNORMAL HIGH (ref 70–99)
Glucose-Capillary: 134 mg/dL — ABNORMAL HIGH (ref 70–99)

## 2011-10-06 LAB — CBC
MCH: 28.4 pg (ref 26.0–34.0)
Platelets: 287 10*3/uL (ref 150–400)
RBC: 3.59 MIL/uL — ABNORMAL LOW (ref 3.87–5.11)

## 2011-10-06 LAB — COMPREHENSIVE METABOLIC PANEL
ALT: 6 U/L (ref 0–35)
AST: 8 U/L (ref 0–37)
CO2: 23 mEq/L (ref 19–32)
Calcium: 9.2 mg/dL (ref 8.4–10.5)
Potassium: 4 mEq/L (ref 3.5–5.1)
Sodium: 140 mEq/L (ref 135–145)
Total Protein: 6 g/dL (ref 6.0–8.3)

## 2011-10-06 LAB — TSH: TSH: 0.573 u[IU]/mL (ref 0.350–4.500)

## 2011-10-06 NOTE — Plan of Care (Signed)
Problem: Phase II Progression Outcomes Goal: Vital signs remain stable Outcome: Progressing Still having some hypoglycemic episodes Goal: IV changed to normal saline lock Outcome: Completed/Met Date Met:  10/06/11 Pt refused IVF

## 2011-10-06 NOTE — Plan of Care (Signed)
Problem: Limited Adherence to Nutrition-Related Recommendations (NB-1.6) Goal: Nutrition education Formal process to instruct or train a patient/client in a skill or to impart knowledge to help patients/clients voluntarily manage or modify food choices and eating behavior to maintain or improve health.  Outcome: Completed/Met Date Met:  10/06/11 RD consult for DM diet education. Reviewed guidelines and recommendations. Per patient recall, she was drinking regular sodas. Handouts provided from [employer]. Pt consuming lunch upon RD visitation; reports a good appetite. BMI = 28.7 kg/m2. No further nutrition intervention at this time. Please consult RD as needed.  Recommend Inpatient Diabetes Treatment Program consult.  Mckenzie Williamson Pager #: [phone]

## 2011-10-06 NOTE — Progress Notes (Signed)
1930 IV was notified to restart another site for patient. Current site External Jugular vein. Patient was very agitated because IV pump continuously was beeping, reading, "occluded on pt's side." IV came up to floor around 2000 and she told them she did not want to be stuck again and she did not want fluids going anymore. Went to patient's room and she refused to have her fluids running. Patient educated on the benefits of fluids. Patient continued to refuse. IV fluids disconnected per patient's request at 2000.  0118 CBG 38. Resident awake, alert and oriented. No shakiness or confusion, skin warm and dry. Family at bedside all night until 0600. No s/sx of hypoglycemia. Patient stated she does not understand how her blood sugar dropped so fast and she feels fine. She stated she thinks the Coreg is what is causing her blood sugar to drop. Orange juice x 2 and graham crackers and peanut butter given. CBG recheck 70. Fruit cups given and more orange juice. Recheck 98. Last blood sugar at 0600 was 89. No s/sx of acute distress.

## 2011-10-06 NOTE — Progress Notes (Signed)
[facility] Progress Note  10/06/2011   Subjective: Pt had another episode of hypoglycemia in the early morning hours.  She is currently eating and drinking well with no problems.  Pt says that she was recently started on 70/30 insulin because of cost.  She has had several severe hypoglycemic reactions from insulin.    Objective:  Vital signs in last 24 hours: Filed Vitals:   10/05/11 1810 10/05/11 2147 10/06/11 0619 10/06/11 0644  BP: 145/64 123/67 117/64   Pulse: 69 80 72   Temp: 97.9 F (36.6 C) 98 F (36.7 C) 99 F (37.2 C)   TempSrc:      Resp: 16 18 18    Weight:    75.569 kg (166 lb 9.6 oz)  SpO2: 99% 98% 98%    Weight change:   Intake/Output Summary (Last 24 hours) at 10/06/11 1049 Last data filed at 10/06/11 0801  Gross per 24 hour  Intake    240 ml  Output      1 ml  Net    239 ml   Lab Results  Component Value Date   HGBA1C 12.0* 10/05/2011   HGBA1C 19.0* 08/22/2011   Lab Results  Component Value Date   CREATININE 0.77 10/06/2011    Review of Systems As above, otherwise all reviewed and reported negative  Physical Exam General - awake, no distress, cooperative HEENT - NCAT, MMM Lungs - BBS, CTA CV - normal s1, s2 sounds Abd - soft, nondistended, no masses, nontender Ext - no cyanosis  Lab Results: Results for orders placed during the hospital encounter of 10/05/11 (from the past 24 hour(s))  GLUCOSE, CAPILLARY     Status: Abnormal   Collection Time   10/05/11 12:25 PM      Component Value Range   Glucose-Capillary 39 (*) 70 - 99 (mg/dL)   Comment 1 Notify RN    POCT I-STAT, CHEM 8     Status: Abnormal   Collection Time   10/05/11 12:30 PM      Component Value Range   Sodium 146 (*) 135 - 145 (mEq/L)   Potassium 3.9  3.5 - 5.1 (mEq/L)   Chloride 110  96 - 112 (mEq/L)   BUN 17  6 - 23 (mg/dL)   Creatinine, Ser 1.47  0.50 - 1.10 (mg/dL)   Glucose, Bld 47 (*) 70 - 99 (mg/dL)   Calcium, Ion 8.29 (*) 1.12 - 1.32 (mmol/L)   TCO2 26  0 - 100  (mmol/L)   Hemoglobin 11.9 (*) 12.0 - 15.0 (g/dL)   HCT 56.2 (*) 13.0 - 46.0 (%)  GLUCOSE, CAPILLARY     Status: Abnormal   Collection Time   10/05/11 12:31 PM      Component Value Range   Glucose-Capillary 224 (*) 70 - 99 (mg/dL)  CBC     Status: Abnormal   Collection Time   10/05/11  1:12 PM      Component Value Range   WBC 8.9  4.0 - 10.5 (K/uL)   RBC 3.88  3.87 - 5.11 (MIL/uL)   Hemoglobin 11.1 (*) 12.0 - 15.0 (g/dL)   HCT 86.5 (*) 78.4 - 46.0 (%)   MCV 87.4  78.0 - 100.0 (fL)   MCH 28.6  26.0 - 34.0 (pg)   MCHC 32.7  30.0 - 36.0 (g/dL)   RDW 69.6  29.5 - 28.4 (%)   Platelets 271  150 - 400 (K/uL)  GLUCOSE, CAPILLARY     Status: Abnormal   Collection Time  10/05/11  1:20 PM      Component Value Range   Glucose-Capillary 100 (*) 70 - 99 (mg/dL)  GLUCOSE, CAPILLARY     Status: Abnormal   Collection Time   10/05/11  3:57 PM      Component Value Range   Glucose-Capillary 42 (*) 70 - 99 (mg/dL)  HEMOGLOBIN X5M     Status: Abnormal   Collection Time   10/05/11  5:09 PM      Component Value Range   Hemoglobin A1C 12.0 (*) <5.7 (%)   Mean Plasma Glucose 298 (*) <117 (mg/dL)  GLUCOSE, CAPILLARY     Status: Abnormal   Collection Time   10/05/11  5:45 PM      Component Value Range   Glucose-Capillary 104 (*) 70 - 99 (mg/dL)  HEPATIC FUNCTION PANEL     Status: Abnormal   Collection Time   10/05/11  7:14 PM      Component Value Range   Total Protein 7.2  6.0 - 8.3 (g/dL)   Albumin 3.6  3.5 - 5.2 (g/dL)   AST 10  0 - 37 (U/L)   ALT 7  0 - 35 (U/L)   Alkaline Phosphatase 60  39 - 117 (U/L)   Total Bilirubin 0.2 (*) 0.3 - 1.2 (mg/dL)   Bilirubin, Direct <8.4  0.0 - 0.3 (mg/dL)   Indirect Bilirubin NOT CALCULATED  0.3 - 0.9 (mg/dL)  TSH     Status: Normal   Collection Time   10/05/11  7:14 PM      Component Value Range   TSH 0.573  0.350 - 4.500 (uIU/mL)  GLUCOSE, CAPILLARY     Status: Normal   Collection Time   10/05/11  8:58 PM      Component Value Range    Glucose-Capillary 85  70 - 99 (mg/dL)  GLUCOSE, CAPILLARY     Status: Normal   Collection Time   10/05/11 11:31 PM      Component Value Range   Glucose-Capillary 86  70 - 99 (mg/dL)  GLUCOSE, CAPILLARY     Status: Abnormal   Collection Time   10/06/11  1:18 AM      Component Value Range   Glucose-Capillary 38 (*) 70 - 99 (mg/dL)   Comment 1 Call MD NNP PA CNM    GLUCOSE, CAPILLARY     Status: Normal   Collection Time   10/06/11  2:25 AM      Component Value Range   Glucose-Capillary 70  70 - 99 (mg/dL)  GLUCOSE, CAPILLARY     Status: Normal   Collection Time   10/06/11  4:02 AM      Component Value Range   Glucose-Capillary 98  70 - 99 (mg/dL)  GLUCOSE, CAPILLARY     Status: Normal   Collection Time   10/06/11  6:23 AM      Component Value Range   Glucose-Capillary 89  70 - 99 (mg/dL)  CBC     Status: Abnormal   Collection Time   10/06/11  6:25 AM      Component Value Range   WBC 6.9  4.0 - 10.5 (K/uL)   RBC 3.59 (*) 3.87 - 5.11 (MIL/uL)   Hemoglobin 10.2 (*) 12.0 - 15.0 (g/dL)   HCT 13.2 (*) 44.0 - 46.0 (%)   MCV 86.9  78.0 - 100.0 (fL)   MCH 28.4  26.0 - 34.0 (pg)   MCHC 32.7  30.0 - 36.0 (g/dL)   RDW 10.2  72.5 -  15.5 (%)   Platelets 287  150 - 400 (K/uL)  COMPREHENSIVE METABOLIC PANEL     Status: Abnormal   Collection Time   10/06/11  6:25 AM      Component Value Range   Sodium 140  135 - 145 (mEq/L)   Potassium 4.0  3.5 - 5.1 (mEq/L)   Chloride 105  96 - 112 (mEq/L)   CO2 23  19 - 32 (mEq/L)   Glucose, Bld 94  70 - 99 (mg/dL)   BUN 15  6 - 23 (mg/dL)   Creatinine, Ser 1.61  0.50 - 1.10 (mg/dL)   Calcium 9.2  8.4 - 09.6 (mg/dL)   Total Protein 6.0  6.0 - 8.3 (g/dL)   Albumin 3.0 (*) 3.5 - 5.2 (g/dL)   AST 8  0 - 37 (U/L)   ALT 6  0 - 35 (U/L)   Alkaline Phosphatase 68  39 - 117 (U/L)   Total Bilirubin 0.2 (*) 0.3 - 1.2 (mg/dL)   GFR calc non Af Amer 79 (*) >90 (mL/min)   GFR calc Af Amer >90  >90 (mL/min)  GLUCOSE, CAPILLARY     Status: Abnormal   Collection  Time   10/06/11  8:33 AM      Component Value Range   Glucose-Capillary 109 (*) 70 - 99 (mg/dL)   Comment 1 Notify RN    GLUCOSE, CAPILLARY     Status: Normal   Collection Time   10/06/11 10:39 AM      Component Value Range   Glucose-Capillary 72  70 - 99 (mg/dL)   Comment 1 Notify RN      Micro Results: No results found for this or any previous visit (from the past 240 hour(s)).  Medications:  Scheduled Meds:   . aspirin EC  81 mg Oral Daily  . carvedilol  25 mg Oral BID  . clopidogrel  75 mg Oral Daily  . dextrose  25 mL Intravenous Once  . dextrose  1 ampule Intravenous Once  . glucagon      . simvastatin  5 mg Oral q1800  . DISCONTD: dextrose 5 % and 0.45 % NaCl with KCl 20 mEq/L   Intravenous Once  . DISCONTD: dextrose 5 % and 0.45% NaCl   Intravenous STAT   Continuous Infusions:   . dextrose 5 % and 0.45% NaCl     PRN Meds:.acetaminophen, acetaminophen, HYDROcodone-acetaminophen, ondansetron (ZOFRAN) IV, ondansetron  Assessment/Plan: Poorly controlled type 2 DM - Pt has high A1c (12%) and was even higher several months ago (19%).  She was started on 70/30 insulin recently because of cost.  However, because she has had more than 1 severe hypoglycemic event, discontinuing all insulin at this time and oral hypoglycemic meds until her BS are consistently stable and no further hypoglycemia.  At that point, i am hesistant to restart insulin.  Will most likely resume oral meds, encouraging better diet control and close outpatient follow up with primary care and endocrinology.  She will benefit most from basal insulin with lantus once daily but says she cannot afford it.  Also, can consider trial of low dose mealtime regular insulin for better postprandial control.  Will defer to outpatient follow up.   CAD - currently stable, continue home meds  Hypoglycemia unawareness - because of this, I am very hesitant to restart this patient on insulin for safety reasons.  Will ask  dietician to see pt about her poor meal and drink choices and will help control her  BS with stopping sodas, sweet teas, etc.    CHF - reviewed CXR, currently stable, no acute.     LOS: 1 day   Santiana Glidden 10/06/2011, 10:49 AM   Cleora Fleet, MD, CDE, FAAFP [facility] [place], [place]  [phone]

## 2011-10-07 ENCOUNTER — Encounter (HOSPITAL_COMMUNITY): Payer: Self-pay | Admitting: Family Medicine

## 2011-10-07 DIAGNOSIS — I509 Heart failure, unspecified: Secondary | ICD-10-CM

## 2011-10-07 DIAGNOSIS — E118 Type 2 diabetes mellitus with unspecified complications: Secondary | ICD-10-CM

## 2011-10-07 DIAGNOSIS — E1169 Type 2 diabetes mellitus with other specified complication: Secondary | ICD-10-CM

## 2011-10-07 DIAGNOSIS — E1165 Type 2 diabetes mellitus with hyperglycemia: Secondary | ICD-10-CM

## 2011-10-07 LAB — COMPREHENSIVE METABOLIC PANEL
ALT: 7 U/L (ref 0–35)
AST: 9 U/L (ref 0–37)
Albumin: 3.1 g/dL — ABNORMAL LOW (ref 3.5–5.2)
CO2: 26 mEq/L (ref 19–32)
Calcium: 9.1 mg/dL (ref 8.4–10.5)
Creatinine, Ser: 0.89 mg/dL (ref 0.50–1.10)
GFR calc non Af Amer: 61 mL/min — ABNORMAL LOW (ref >90)
Sodium: 142 mEq/L (ref 135–145)
Total Protein: 6.1 g/dL (ref 6.0–8.3)

## 2011-10-07 LAB — GLUCOSE, CAPILLARY
Glucose-Capillary: 116 mg/dL — ABNORMAL HIGH (ref 70–99)
Glucose-Capillary: 120 mg/dL — ABNORMAL HIGH (ref 70–99)
Glucose-Capillary: 196 mg/dL — ABNORMAL HIGH (ref 70–99)
Glucose-Capillary: 280 mg/dL — ABNORMAL HIGH (ref 70–99)

## 2011-10-07 MED ORDER — ASPIRIN 81 MG PO TBEC: 81.0000 mg | DELAYED_RELEASE_TABLET | Freq: Every day | ORAL | Status: DC

## 2011-10-07 MED ORDER — METFORMIN HCL ER 500 MG PO TB24
500.0000 mg | ORAL_TABLET | Freq: Two times a day (BID) | ORAL | Status: DC
  Filled 2011-10-07 (×2): qty 1

## 2011-10-07 MED ORDER — LINAGLIPTIN 5 MG PO TABS
5.0000 mg | ORAL_TABLET | Freq: Every day | ORAL | Status: DC
  Administered 2011-10-07: 5 mg via ORAL
  Filled 2011-10-07: qty 1

## 2011-10-07 MED ORDER — LINAGLIPTIN 5 MG PO TABS: 5.0000 mg | ORAL_TABLET | Freq: Every day | ORAL | Status: DC

## 2011-10-07 MED ORDER — METFORMIN HCL ER 500 MG PO TB24: 500.0000 mg | ORAL_TABLET | Freq: Two times a day (BID) | ORAL | Status: AC

## 2011-10-07 NOTE — Discharge Instructions (Signed)
Low Blood Sugar Low blood sugar (hypoglycemia) means that the level of sugar in your blood is lower than it should be. Signs of low blood sugar include:  Getting sweaty.   Feeling hungry.   Feeling dizzy or weak.   Feeling sleepier than normal.   Feeling nervous.   Headaches.   Having a fast heartbeat.  Low blood sugar can happen fast and can be an emergency. Your doctor can do tests to check your blood sugar level. You can have low blood sugar and not have diabetes. HOME CARE  Check your blood sugar as told by your doctor. If it is less than 70 mg/dl or as told by your doctor, take 1 of the following:   3 to 4 glucose tablets.    cup clear juice.    cup soda pop, not diet.   1 cup milk.   5 to 6 hard candies.   Recheck blood sugar after 15 minutes. Repeat until it is at the right level.   Eat a snack if it is more than 1 hour until the next meal.   Only take medicine as told by your doctor.   Do not skip meals. Eat on time.   Do not drink alcohol except with meals.   Check your blood glucose before driving.   Check your blood glucose before and after exercise.   Always carry treatment with you, such as glucose pills.   Always wear a medical alert bracelet if you have diabetes.  GET HELP RIGHT AWAY IF:   Your blood glucose goes below 70 mg/dl or as told by your doctor, and you:   Are confused.   Are not able to swallow.   Pass out (faint).   You cannot treat yourself. You may need someone to help you.   You have low blood sugar problems often.   You have problems from your medicines.   You are not feeling better after 3 to 4 days.   You have vision changes.  MAKE SURE YOU:   Understand these instructions.   Will watch this condition.   Will get help right away if you are not doing well or get worse.  Document Released: 08/08/2009 Document Revised: 05/03/2011 Document Reviewed: 08/08/2009 [facility] Patient Information 2012 [place],  [place].  Blood Sugar Monitoring, Adult GLUCOSE METERS FOR SELF-MONITORING OF BLOOD GLUCOSE  It is important to be able to correctly measure your blood sugar (glucose). You can use a blood glucose monitor (a small battery-operated device) to check your glucose level at any time. This allows you and your caregiver to monitor your diabetes and to determine how well your treatment plan is working. The process of monitoring your blood glucose with a glucose meter is called self-monitoring of blood glucose (SMBG). When people with diabetes control their blood sugar, they have better health. To test for glucose with a typical glucose meter, place the disposable strip in the meter. Then place a small sample of blood on the "test strip." The test strip is coated with chemicals that combine with glucose in blood. The meter measures how much glucose is present. The meter displays the glucose level as a number. Several new models can record and store a number of test results. Some models can connect to personal computers to store test results or print them out.  Newer meters are often easier to use than older models. Some meters allow you to get blood from places other than your fingertip. Some new models have automatic  timing, error codes, signals, or barcode readers to help with proper adjustment (calibration). Some meters have a large display screen or spoken instructions for people with visual impairments.  INSTRUCTIONS FOR USING GLUCOSE METERS  Wash your hands with soap and warm water, or clean the area with alcohol. Dry your hands completely.   Prick the side of your fingertip with a lancet (a sharp-pointed tool used by hand).   Hold the hand down and gently milk the finger until a small drop of blood appears. Catch the blood with the test strip.   Follow the instructions for inserting the test strip and using the SMBG meter. Most meters require the meter to be turned on and the test strip to be inserted  before applying the blood sample.   Record the test result.   Read the instructions carefully for both the meter and the test strips that go with it. Meter instructions are found in the user manual. Keep this manual to help you solve any problems that may arise. Many meters use "error codes" when there is a problem with the meter, the test strip, or the blood sample on the strip. You will need the manual to understand these error codes and fix the problem.   New devices are available such as laser lancets and meters that can test blood taken from "alternative sites" of the body, other than fingertips. However, you should use standard fingertip testing if your glucose changes rapidly. Also, use standard testing if:   You have eaten, exercised, or taken insulin in the past 2 hours.   You think your glucose is low.   You tend to not feel symptoms of low blood glucose (hypoglycemia).   You are ill or under stress.   Clean the meter as directed by the manufacturer.   Test the meter for accuracy as directed by the manufacturer.   Take your meter with you to your caregiver's office. This way, you can test your glucose in front of your caregiver to make sure you are using the meter correctly. Your caregiver can also take a sample of blood to test using a routine lab method. If values on the glucose meter are close to the lab results, you and your caregiver will see that your meter is working well and you are using good technique. Your caregiver will advise you about what to do if the results do not match.  FREQUENCY OF TESTING  Your caregiver will tell you how often you should check your blood glucose. This will depend on your type of diabetes, your current level of diabetes control, and your types of medicines. The following are general guidelines, but your care plan may be different. Record all your readings and the time of day you took them for review with your caregiver.   Diabetes type 1.    When you are using insulin with good diabetic control (either multiple daily injections or via a pump), you should check your glucose 4 times a day.   If your diabetes is not well controlled, you may need to monitor more frequently, including before meals and 2 hours after meals, at bedtime, and occasionally between 2 a.m. and 3 a.m.   You should always check your glucose before a dose of insulin or before changing the rate on your insulin pump.   Diabetes type 2.   Guidelines for SMBG in diabetes type 2 are not as well defined.   If you are on insulin, follow the guidelines above.  If you are on medicines, but not insulin, and your glucose is not well controlled, you should test at least twice daily.   If you are not on insulin, and your diabetes is controlled with medicines or diet alone, you should test at least once daily, usually before breakfast.   A weekly profile will help your caregiver advise you on your care plan. The week before your visit, check your glucose before a meal and 2 hours after a meal at least daily. You may want to test before and after a different meal each day so you and your caregiver can tell how well controlled your blood sugars are throughout the course of a 24 hour period.   Gestational diabetes (diabetes during pregnancy).   Frequent testing is often necessary. Accurate timing is important.   If you are not on insulin, check your glucose 4 times a day. Check it before breakfast and 1 hour after the start of each meal.   If you are on insulin, check your glucose 6 times a day. Check it before each meal and 1 hour after the first bite of each meal.   General guidelines.   More frequent testing is required at the start of insulin treatment. Your caregiver will instruct you.   Test your glucose any time you suspect you have low blood sugar (hypoglycemia).   You should test more often when you change medicines, when you have unusual stress or illness,  or in other unusual circumstances.  OTHER THINGS TO KNOW ABOUT GLUCOSE METERS  Measurement Range. Most glucose meters are able to read glucose levels over a broad range of values from as low as 0 to as high as 600 mg/dL. If you get an extremely high or low reading from your meter, you should first confirm it with another reading. Report very high or very low readings to your caregiver.   Whole Blood Glucose versus Plasma Glucose. Some older home glucose meters measure glucose in your whole blood. In a lab or when using some newer home glucose meters, the glucose is measured in your plasma (one component of blood). The difference can be important. It is important for you and your caregiver to know whether your meter gives its results as "whole blood equivalent" or "plasma equivalent."   Display of High and Low Glucose Values. Part of learning how to operate a meter is understanding what the meter results mean. Know how high and low glucose concentrations are displayed on your meter.   Factors that Affect Glucose Meter Performance. The accuracy of your test results depends on many factors and varies depending on the brand and type of meter. These factors include:   Low red blood cell count (anemia).   Substances in your blood (such as uric acid, vitamin C, and others).   Environmental factors (temperature, humidity, altitude).   Name-brand versus generic test strips.   Calibration. Make sure your meter is set up properly. It is a good idea to do a calibration test with a control solution recommended by the manufacturer of your meter whenever you begin using a fresh bottle of test strips. This will help verify the accuracy of your meter.   Improperly stored, expired, or defective test strips. Keep your strips in a dry place with the lid on.   Soiled meter.   Inadequate blood sample.  NEW TECHNOLOGIES FOR GLUCOSE TESTING Alternative site testing Some glucose meters allow testing blood from  alternative sites. These include the:  Upper arm.  Forearm.   Base of the thumb.   Thigh.  Sampling blood from alternative sites may be desirable. However, it may have some limitations. Blood in the fingertips show changes in glucose levels more quickly than blood in other parts of the body. This means that alternative site test results may be different from fingertip test results, not because of the meter's ability to test accurately, but because the actual glucose concentration can be different.  Continuous Glucose Monitoring Devices to measure your blood glucose continuously are available, and others are in development. These methods can be more expensive than self-monitoring with a glucose meter. However, it is uncertain how effective and reliable these devices are. Your caregiver will advise you if this approach makes sense for you. IF BLOOD SUGARS ARE CONTROLLED, PEOPLE WITH DIABETES REMAIN HEALTHIER.  SMBG is an important part of the treatment plan of patients with diabetes mellitus. Below are reasons for using SMBG:   It confirms that your glucose is at a specific, healthy level.   It detects hypoglycemia and severe hyperglycemia.   It allows you and your caregiver to make adjustments in response to changes in lifestyle for individuals requiring medicine.   It determines the need for starting insulin therapy in temporary diabetes that happens during pregnancy (gestational diabetes).  Document Released: 05/17/2003 Document Revised: 05/03/2011 Document Reviewed: 09/07/2010 [facility] Patient Information 2012 [place], [place].  Diabetes Meal Planning Guide The diabetes meal planning guide is a tool to help you plan your meals and snacks. It is important for people with diabetes to manage their blood glucose (sugar) levels. Choosing the right foods and the right amounts throughout your day will help control your blood glucose. Eating right can even help you improve your blood pressure and  reach or maintain a healthy weight. CARBOHYDRATE COUNTING MADE EASY When you eat carbohydrates, they turn to sugar. This raises your blood glucose level. Counting carbohydrates can help you control this level so you feel better. When you plan your meals by counting carbohydrates, you can have more flexibility in what you eat and balance your medicine with your food intake. Carbohydrate counting simply means adding up the total amount of carbohydrate grams in your meals and snacks. Try to eat about the same amount at each meal. Foods with carbohydrates are listed below. Each portion below is 1 carbohydrate serving or 15 grams of carbohydrates. Ask your dietician how many grams of carbohydrates you should eat at each meal or snack. Grains and Starches  1 slice bread.    English muffin or hotdog/hamburger bun.    cup cold cereal (unsweetened).   ? cup cooked pasta or rice.    cup starchy vegetables (corn, potatoes, peas, beans, winter squash).   1 tortilla (6 inches).    bagel.   1 waffle or pancake (size of a CD).    cup cooked cereal.   4 to 6 small crackers.  *Whole grain is recommended. Fruit  1 cup fresh unsweetened berries, melon, papaya, pineapple.   1 small fresh fruit.    banana or mango.    cup fruit juice (4 oz unsweetened).    cup canned fruit in natural juice or water.   2 tbs dried fruit.   12 to 15 grapes or cherries.  Milk and Yogurt  1 cup fat-free or 1% milk.   1 cup soy milk.   6 oz light yogurt with sugar-free sweetener.   6 oz low-fat soy yogurt.   6 oz plain yogurt.  Vegetables  1 cup raw or  cup cooked is counted as 0 carbohydrates or a "free" food.   If you eat 3 or more servings at 1 meal, count them as 1 carbohydrate serving.  Other Carbohydrates   oz chips or pretzels.    cup ice cream or frozen yogurt.    cup sherbet or sorbet.   2 inch square cake, no frosting.   1 tbs honey, sugar, jam, jelly, or syrup.   2  small cookies.   3 squares of graham crackers.   3 cups popcorn.   6 crackers.   1 cup broth-based soup.   Count 1 cup casserole or other mixed foods as 2 carbohydrate servings.   Foods with less than 20 calories in a serving may be counted as 0 carbohydrates or a "free" food.  You may want to purchase a book or computer software that lists the carbohydrate gram counts of different foods. In addition, the nutrition facts panel on the labels of the foods you eat are a good source of this information. The label will tell you how big the serving size is and the total number of carbohydrate grams you will be eating per serving. Divide this number by 15 to obtain the number of carbohydrate servings in a portion. Remember, 1 carbohydrate serving equals 15 grams of carbohydrate. SERVING SIZES Measuring foods and serving sizes helps you make sure you are getting the right amount of food. The list below tells how big or small some common serving sizes are.  1 oz.........4 stacked dice.   3 oz........[place] KitchenDeck of cards.   1 tsp.......[place] KitchenTip of little finger.   1 tbs......Marland KitchenMarland KitchenThumb.   2 tbs.......Marland KitchenGolf ball.    cup......Marland KitchenHalf of a fist.   1 cup.......Marland KitchenA fist.  SAMPLE DIABETES MEAL PLAN Below is a sample meal plan that includes foods from the grain and starches, dairy, vegetable, fruit, and meat groups. A dietician can individualize a meal plan to fit your calorie needs and tell you the number of servings needed from each food group. However, controlling the total amount of carbohydrates in your meal or snack is more important than making sure you include all of the food groups at every meal. You may interchange carbohydrate containing foods (dairy, starches, and fruits). The meal plan below is an example of a 2000 calorie diet using carbohydrate counting. This meal plan has 17 carbohydrate servings. Breakfast  1 cup oatmeal (2 carb servings).    cup light yogurt (1 carb serving).   1 cup  blueberries (1 carb serving).    cup almonds.  Snack  1 large apple (2 carb servings).   1 low-fat string cheese stick.  Lunch  Chicken breast salad.   1 cup spinach.    cup chopped tomatoes.   2 oz chicken breast, sliced.   2 tbs low-fat [place] dressing.   12 whole-wheat crackers (2 carb servings).   12 to 15 grapes (1 carb serving).   1 cup low-fat milk (1 carb serving).  Snack  1 cup carrots.    cup hummus (1 carb serving).  Dinner  3 oz broiled salmon.   1 cup brown rice (3 carb servings).  Snack  1  cups steamed broccoli (1 carb serving) drizzled with 1 tsp olive oil and lemon juice.   1 cup light pudding (2 carb servings).  DIABETES MEAL PLANNING WORKSHEET Your dietician can use this worksheet to help you decide how many servings of foods and what types of foods are right for  you.  BREAKFAST Food Group and Servings / Carb Servings Grain/Starches __________________________________ Dairy __________________________________________ Vegetable ______________________________________ Fruit ___________________________________________ Meat __________________________________________ Fat ____________________________________________ LUNCH Food Group and Servings / Carb Servings Grain/Starches ___________________________________ Dairy ___________________________________________ Fruit ____________________________________________ Meat ___________________________________________ Fat _____________________________________________ [place] Food Group and Servings / Carb Servings Grain/Starches ___________________________________ Dairy ___________________________________________ Fruit ____________________________________________ Meat ___________________________________________ Fat _____________________________________________ SNACKS Food Group and Servings / Carb Servings Grain/Starches ___________________________________ Dairy  ___________________________________________ Vegetable _______________________________________ Fruit ____________________________________________ Meat ___________________________________________ Fat _____________________________________________ DAILY TOTALS Starches _________________________ Vegetable ________________________ Fruit ____________________________ Dairy ____________________________ Meat ____________________________ Fat ______________________________ Document Released: 02/08/2005 Document Revised: 05/03/2011 Document Reviewed: 12/20/2008 [employer] Patient Information 2012 [place], [place].  Diets for Diabetes, Food Labeling Look at food labels to help you decide how much of a product you can eat. You will want to check the amount of total carbohydrate in a serving to see how the food fits into your meal plan. In the list of ingredients, the ingredient present in the largest amount by weight must be listed first, followed by the other ingredients in descending order. STANDARD OF IDENTITY Most products have a list of ingredients. However, foods that the Food and Drug Administration (FDA) has given a standard of identity do not need a list of ingredients. A standard of identity means that a food must contain certain ingredients if it is called a particular name. Examples are mayonnaise, peanut butter, ketchup, jelly, and cheese. LABELING TERMS There are many terms found on food labels. Some of these terms have specific definitions. Some terms are regulated by the FDA, and the FDA has clearly specified how they can be used. Others are not regulated or well-defined and can be misleading and confusing. SPECIFICALLY DEFINED TERMS Nutritive Sweetener.  A sweetener that contains calories,such as table sugar or honey.  Nonnutritive Sweetener.  A sweetener with few or no calories,such as saccharin, aspartame, sucralose, and cyclamate.  LABELING TERMS REGULATED BY THE FDA Free.  The  product contains only a tiny or small amount of fat, cholesterol, sodium, sugar, or calories. For example, a "fat-free" product will contain less than 0.5 g of fat per serving.  Low.  A food described as "low" in fat, saturated fat, cholesterol, sodium, or calories could be eaten fairly often without exceeding dietary guidelines. For example, "low in fat" means no more than 3 g of fat per serving.  Lean.  "Lean" and "extra lean" are U.S. Department of Agriculture Architect) terms for use on meat and poultry products. "Lean" means the product contains less than 10 g of fat, 4 g of saturated fat, and 95 mg of cholesterol per serving. "Lean" is not as low in fat as a product labeled "low."  Extra Lean.  "Extra lean" means the product contains less than 5 g of fat, 2 g of saturated fat, and 95 mg of cholesterol per serving. While "extra lean" has less fat than "lean," it is still higher in fat than a product labeled "low."  Reduced, Less, Fewer.  A diet product that contains 25% less of a nutrient or calories than the regular version. For example, hot dogs might be labeled "25% less fat than our regular hot dogs."  Light/Lite.  A diet product that contains ? fewer calories or  the fat of the original. For example, "light in sodium" means a product with  the usual sodium.  More.  One serving contains at least 10% more of the daily value of a vitamin, mineral, or fiber than usual.  Good Source Of.  One serving contains  10% to 19% of the daily value for a particular vitamin, mineral, or fiber.  Excellent Source Of.  One serving contains 20% or more of the daily value for a particular nutrient. Other terms used might be "high in" or "rich in."  Enriched or Fortified.  The product contains added vitamins, minerals, or protein. Nutrition labeling must be used on enriched or fortified foods.  Imitation.  The product has been altered so that it is lower in protein, vitamins, or minerals than the  usual food,such as imitation peanut butter.  Total Fat.  The number listed is the total of all fat found in a serving of the product. Under total fat, food labels must list saturated fat and trans fat, which are associated with raising bad cholesterol and an increased risk of heart blood vessel disease.  Saturated Fat.  Mainly fats from animal-based sources. Some examples are red meat, cheese, cream, whole milk, and coconut oil.  Trans Fat.  Found in some fried snack foods, packaged foods, and fried restaurant foods. It is recommended you eat as close to 0 g of trans fat as possible, since it raises bad cholesterol and lowers good cholesterol.  Polyunsaturated and Monounsaturated Fats.  More healthful fats. These fats are from plant sources.  Total Carbohydrate.  The number of carbohydrate grams in a serving of the product. Under total carbohydrate are listed the other carbohydrate sources, such as dietary fiber and sugars.  Dietary Fiber.  A carbohydrate from plant sources.  Sugars.  Sugars listed on the label contain all naturally occurring sugars as well as added sugars.  LABELING TERMS NOT REGULATED BY THE FDA Sugarless.  Table sugar (sucrose) has not been added. However, the manufacturer may use another form of sugar in place of sucrose to sweeten the product. For example, sugar alcohols are used to sweeten foods. Sugar alcohols are a form of sugar but are not table sugar. If a product contains sugar alcohols in place of sucrose, it can still be labeled "sugarless."  Low Salt, Salt-Free, Unsalted, No Salt, No Salt Added, Without Added Salt.  Food that is usually processed with salt has been made without salt. However, the food may contain sodium-containing additives, such as preservatives, leavening agents, or flavorings.  Natural.  This term has no legal meaning.  Organic.  Foods that are certified as organic have been inspected and approved by the USDA to ensure they are  produced without pesticides, fertilizers containing synthetic ingredients, bioengineering, or ionizing radiation.  Document Released: 05/17/2003 Document Revised: 05/03/2011 Document Reviewed: 12/02/2008 [facility] Patient Information 2012 [place], [place].

## 2011-10-07 NOTE — Progress Notes (Signed)
Pt received discharge instructions and scripts x3 in presence of two sons and verbalized understanding of same. Pt d/c home with no equipments. Left unit in wheelchair provided by nurse with no complaints.

## 2011-10-07 NOTE — Discharge Summary (Signed)
Physician Discharge Summary  Patient ID: Mckenzie Williamson MRN: [id number] DOB/AGE: January 27, 1933 78 y.o.  Admit date: 10/05/2011 Discharge date: 10/07/2011  Admission Diagnoses: severe hypoglycemia reaction from insulin  Discharge Diagnoses:  Active Problems:  Diabetes mellitus  CHF, chronic  Hypoglycemia - resolved  Discharged Condition: good  Hospital Course: Pt was admitted and started on a d5 drip and taken off all insulin.  She continued to have several episodes of hypoglycemia but that resolved when the insulin was out of her system. She began eating and drinking well and blood sugars improved. She will be resumed on oral metformin and started on tradjenta 5 mg daily for blood sugar control.  It is not recommended that she restart insulin at this time because of her having several episodes of severe hypoglycemia and apparent hypoglycemic unawareness.  She should be going to see her PCP in the next several days for follow up of her blood sugars and medications.  If pt remains on metformin, renal function should be monitored closely.  Pt to discuss diabetes medications further with her PCP later this week.  Pt given instructions to return if symptoms recur, worsen or new problems develop.    Pt eating and drinking well, asking to go home, in no distress, calm and cooperative Discharge Exam: Blood pressure 123/52, pulse 72, temperature 97.9 F (36.6 C), temperature source Oral, resp. rate 18, height 5\' 2"  (1.575 m), weight 74.2 kg (163 lb 9.3 oz), SpO2 99.00%. Head: Normocephalic, without obvious abnormality, atraumatic Eyes: conjunctivae/corneas clear. PERRL, EOM's intact. Fundi benign. Nose: Nares normal. Septum midline. Mucosa normal. No drainage or sinus tenderness., no discharge Throat: lips, mucosa, and tongue normal; teeth and gums normal Neck: no adenopathy, no carotid bruit, no JVD, supple, symmetrical, trachea midline and thyroid not enlarged, symmetric, no  tenderness/mass/nodules Resp: clear to auscultation bilaterally Chest wall: no tenderness Cardio: regular rate and rhythm, S1, S2 normal, no murmur, click, rub or gallop Extremities: extremities normal, atraumatic, no cyanosis or edema Skin: Skin color, texture, turgor normal. No rashes or lesions  Disposition: 01-Home or Self Care  Discharge Orders    Future Appointments: Provider: Department: Dept Phone: Center:   11/01/2011 9:15 AM Rosalio Macadamia, NP [facility] [phone] None     Future Orders Please Complete By Expires   Increase activity slowly      Discharge instructions      Comments:   Check blood sugars 3 - 4 times per day and report readings to your primary care physician.   See your primary care physician later this week for follow up. Stop taking insulin at this time until you follow up with your primary care physician.    (HEART FAILURE PATIENTS) Call MD:  Anytime you have any of the following symptoms: 1) 3 pound weight gain in 24 hours or 5 pounds in 1 week 2) shortness of breath, with or without a dry hacking cough 3) swelling in the hands, feet or stomach 4) if you have to sleep on extra pillows at night in order to breathe.        Medication List  As of 10/07/2011 11:50 AM   STOP taking these medications         aspirin 325 MG tablet      insulin aspart protamine-insulin aspart (70-30) 100 UNIT/ML injection         TAKE these medications         aspirin 81 MG EC tablet   Take 1 tablet (81 mg  total) by mouth daily.      carvedilol 25 MG tablet   Commonly known as: COREG   Take 25 mg by mouth 2 (two) times daily.      clopidogrel 75 MG tablet   Commonly known as: PLAVIX   Take 75 mg by mouth daily.      Fenofibrate 150 MG Caps   Take 160 mg by mouth daily.      linagliptin 5 MG Tabs tablet   Commonly known as: TRADJENTA   Take 1 tablet (5 mg total) by mouth daily.      lisinopril 10 MG tablet   Commonly known as: PRINIVIL,ZESTRIL   Take  10 mg by mouth daily.      metFORMIN 500 MG (MOD) 24 hr tablet   Commonly known as: GLUMETZA   Take 500 mg by mouth 2 (two) times daily with a meal.      multivitamin tablet   Take 1 tablet by mouth daily.      pravastatin 40 MG tablet   Commonly known as: PRAVACHOL   Take 40 mg by mouth daily.      Vitamin D (Ergocalciferol) 50000 UNITS Caps   Commonly known as: DRISDOL   Take 50,000 Units by mouth every 7 (seven) days.           Follow-up Information                Follow up with Oneal Grout, MD in 4 days. (recheck blood sugars )    Contact information:   [address] [facility] [place] [address] [phone]       Follow up with cardiologist  in 2 weeks. (hospital follow up)         I spent 35 mins preparing this discharge, reviewing labs, blood sugars and dictating.    Signed: Davinity Fanara 10/07/2011, 11:50 AM

## 2011-11-01 ENCOUNTER — Ambulatory Visit: Payer: Medicare Other | Admitting: Nurse Practitioner

## 2011-11-07 ENCOUNTER — Encounter: Payer: Self-pay | Admitting: Nurse Practitioner

## 2011-11-07 ENCOUNTER — Ambulatory Visit (INDEPENDENT_AMBULATORY_CARE_PROVIDER_SITE_OTHER): Payer: Medicare Other | Admitting: Nurse Practitioner

## 2011-11-07 VITALS — BP 124/56 | HR 68 | Ht 62.0 in | Wt 158.4 lb

## 2011-11-07 DIAGNOSIS — I509 Heart failure, unspecified: Secondary | ICD-10-CM

## 2011-11-07 DIAGNOSIS — I10 Essential (primary) hypertension: Secondary | ICD-10-CM

## 2011-11-07 DIAGNOSIS — I5020 Unspecified systolic (congestive) heart failure: Secondary | ICD-10-CM

## 2011-11-07 LAB — BASIC METABOLIC PANEL
BUN: 19 mg/dL (ref 6–23)
CO2: 27 mEq/L (ref 19–32)
Calcium: 9.4 mg/dL (ref 8.4–10.5)
Chloride: 106 mEq/L (ref 96–112)
Creatinine, Ser: 0.8 mg/dL (ref 0.4–1.2)
GFR: 84.47 mL/min (ref >60.00)
Glucose, Bld: 125 mg/dL — ABNORMAL HIGH (ref 70–99)
Potassium: 4.8 mEq/L (ref 3.5–5.1)
Sodium: 141 mEq/L (ref 135–145)

## 2011-11-07 NOTE — Assessment & Plan Note (Signed)
Blood pressure is ok at this time. No change with her current regimen. Will plan on seeing her back in about 10 weeks. Patient is agreeable to this plan and will call if any problems develop in the interim.

## 2011-11-07 NOTE — Progress Notes (Signed)
Mckenzie Williamson Date of Birth: [birth year] Medical Record #[id number]  History of Present Illness: [name] is seen today for a one month check. She is seen for Dr. Sw[place]iland. She has multiple medical issues which include CAD with prior CABG back in 2005. Had repeat cath in 2008 showing that all of her vein grafts were occluded. Other issues include uncontrolled DM (A1C of 19), anemia, HTN and HLD. She has had long standing noncompliance. EF is now down to 30 to 35% per recent echo. We have been titrating her medicines. Coreg was increased at the last visit. She reported then that she was not going to be able to afford her Plavix and is no longer taking.   She comes in today. She is here alone. Seems to be doing ok from our standpoint. Says she is taking her cardiac medicines. Has been back to the hospital with severe hypoglycemia. Was told to see Dr. Sander Williamson but she has failed to do so. Says she can't get thru on their phone line. Blood sugar was 56 today per her report. She is not short of breath. Her weight has been fairly stable. No swelling. Not using salt. Remains on Metformin. She remains anemic.   Current Outpatient Prescriptions on File Prior to Visit  Medication Sig Dispense Refill  . aspirin EC 81 MG EC tablet Take 1 tablet (81 mg total) by mouth daily.  30 tablet  0  . carvedilol (COREG) 25 MG tablet Take 25 mg by mouth 2 (two) times daily.      Marland Kitchen lisinopril (PRINIVIL,ZESTRIL) 10 MG tablet Take 10 mg by mouth daily.      . metFORMIN (GLUCOPHAGE-XR) 500 MG 24 hr tablet Take 1 tablet (500 mg total) by mouth 2 (two) times daily with a meal.  30 tablet  0  . Multiple Vitamin (MULTIVITAMIN) tablet Take 1 tablet by mouth daily.        . pravastatin (PRAVACHOL) 40 MG tablet Take 40 mg by mouth daily.      . Vitamin D, Ergocalciferol, (DRISDOL) 50000 UNITS CAPS Take 50,000 Units by mouth every 7 (seven) days.      Marland Kitchen linagliptin (TRADJENTA) 5 MG TABS tablet Take 1 tablet (5 mg total) by  mouth daily.  30 tablet  0    No Known Allergies  Past Medical History  Diagnosis Date  . Coronary artery disease     s/p CABG in 2005; last cath in 2008 showing occlusion of all SVG's;   . Diabetes mellitus   . Hyperlipidemia   . Hypertension   . Noncompliance   . LV dysfunction     EF 30 to 35% per echo 3/13  . Dizzy   . Hypoglycemia     Past Surgical History  Procedure Date  . Coronary artery bypass graft 2005    THIS INCLUDED SAPHENOUS VEIN GRAFT TO THE PDA, SAPHENOUS VEIN GRAFT TO OBTUSE MARGINAL VESSEL, AS LIMA GRAFT TO THE LAD.  Marland Kitchen Cardiac catheterization 2008    EF 50-55%. SHOWED OCCLUSION OF VEIN GRAFT  . Cholecystectomy   . Transthoracic echocardiogram 05/04/2004    EF 50-55%  . Cardiovascular stress test 12/24/2006    EF 47%    History  Smoking status  . Never Smoker   Smokeless tobacco  . Never Used    History  Alcohol Use No    Family History  Problem Relation Age of Onset  . Tuberculosis Mother   . Lung cancer Father   . Diabetes  Sister   . Heart attack Brother   . Hypertension Brother   . Diabetes Brother     Review of Systems: The review of systems is per the HPI.  All other systems were reviewed and are negative.  Physical Exam: BP 124/56  Pulse 68  Ht 5\' 2"  (1.575 m)  Wt 158 lb 6.4 oz (71.85 kg)  BMI 28.97 kg/m2  SpO2 97% Patient is pleasant and in no acute distress. Skin is warm and dry. Color is normal.  HEENT is unremarkable. Normocephalic/atraumatic. PERRL. Sclera are nonicteric. Neck is supple. No masses. No JVD. Lungs are clear. Cardiac exam shows a regular rate and rhythm. No S3 noted. Abdomen is obese but soft. Extremities are without edema. Gait and ROM are intact. No gross neurologic deficits noted.   LABORATORY DATA: BMET for today is pending.    Lab Results  Component Value Date   WBC 6.9 10/06/2011   HGB 10.2* 10/06/2011   HCT 31.2* 10/06/2011   PLT 287 10/06/2011   GLUCOSE 118* 10/07/2011   ALT 7 10/07/2011   AST 9  10/07/2011   NA 142 10/07/2011   K 4.2 10/07/2011   CL 108 10/07/2011   CREATININE 0.89 10/07/2011   BUN 18 10/07/2011   CO2 26 10/07/2011   TSH 0.573 10/05/2011   INR 0.9 10/22/2007   HGBA1C 12.0* 10/05/2011     Assessment / Plan:

## 2011-11-07 NOTE — Assessment & Plan Note (Signed)
Blood sugar continues to be an issue. She has not followed up with her PCP. Will try to arrange for an appointment for her.

## 2011-11-07 NOTE — Patient Instructions (Addendum)
We are going to check some lab today  We are going to arrange for an office visit with your primary doctor - Dr. Sander Radon as soon as possible.   Dr. Swaziland will see you in about 10 weeks  Watch your salt  Patient is agreeable to this plan and will call if any problems develop in the interim.

## 2011-11-07 NOTE — Assessment & Plan Note (Signed)
EF is 35%. She is on target dose of her Coreg and ACE. Appears compensated. Will check a BMET today.

## 2011-12-12 ENCOUNTER — Other Ambulatory Visit: Payer: Medicare Other

## 2011-12-12 ENCOUNTER — Ambulatory Visit: Payer: Medicare Other

## 2012-01-15 ENCOUNTER — Ambulatory Visit: Payer: Medicare Other | Admitting: Cardiology

## 2012-02-06 ENCOUNTER — Ambulatory Visit (INDEPENDENT_AMBULATORY_CARE_PROVIDER_SITE_OTHER): Payer: Medicare Other | Admitting: Nurse Practitioner

## 2012-02-06 ENCOUNTER — Other Ambulatory Visit: Payer: Self-pay | Admitting: *Deleted

## 2012-02-06 ENCOUNTER — Encounter: Payer: Self-pay | Admitting: Nurse Practitioner

## 2012-02-06 VITALS — BP 128/64 | HR 76 | Ht 62.0 in | Wt 148.0 lb

## 2012-02-06 DIAGNOSIS — I255 Ischemic cardiomyopathy: Secondary | ICD-10-CM

## 2012-02-06 DIAGNOSIS — I2589 Other forms of chronic ischemic heart disease: Secondary | ICD-10-CM

## 2012-02-06 DIAGNOSIS — E785 Hyperlipidemia, unspecified: Secondary | ICD-10-CM

## 2012-02-06 MED ORDER — PRAVASTATIN SODIUM 40 MG PO TABS: 40.0000 mg | ORAL_TABLET | Freq: Every day | ORAL | Status: DC

## 2012-02-06 MED ORDER — LISINOPRIL 10 MG PO TABS: 10.0000 mg | ORAL_TABLET | Freq: Every day | ORAL | Status: DC

## 2012-02-06 MED ORDER — CARVEDILOL 25 MG PO TABS: 25.0000 mg | ORAL_TABLET | Freq: Two times a day (BID) | ORAL | Status: AC

## 2012-02-06 NOTE — Patient Instructions (Signed)
I have refilled your Coreg, Lisinopril and Pravachol today to [facility]  We will check labs today  We will see you in 4 months  Call the [facility] office at [phone] if you have any questions, problems or concerns.

## 2012-02-06 NOTE — Progress Notes (Signed)
Mckenzie Williamson Date of Birth: Sep 21, 1932 Medical Record #[id number]  History of Present Illness: [name] is seen today for a 3 month check. She is seen for Dr. Sw[place]iland. She has multiple medical issues which include CAD with prior CABG back in 2005. Had repeat cath in 2008 showing that all of her vein grafts were occluded. Other issues include uncontrolled DM (A1C of 19), anemia, HTN and HLD. She has had long standing noncompliance. EF is now down to 30 to 35% per echo back in March. She could not afford long term Plavix.   She comes in today. She is here alone. Says she is doing well. History is a little hard to obtain. Says her weight is stable but she is down 10 pounds. Says she is taking her medicines but then tells me she has been out of them. Not sure if it is all of them or not. Says she has been back to [facility] but I do not see any documentation. She can't really tell me why she was there. She admits that she has trouble remembering. No swelling. Not short of breath. Says she is still seeing Dr. Sander Williamson. She will have a rare chest pain and headache and will take an occasional extra aspirin with good results.   Current Outpatient Prescriptions on File Prior to Visit  Medication Sig Dispense Refill  . aspirin EC 81 MG EC tablet Take 1 tablet (81 mg total) by mouth daily.  30 tablet  0  . linagliptin (TRADJENTA) 5 MG TABS tablet Take 1 tablet (5 mg total) by mouth daily.  30 tablet  0  . metFORMIN (GLUCOPHAGE-XR) 500 MG 24 hr tablet Take 1 tablet (500 mg total) by mouth 2 (two) times daily with a meal.  30 tablet  0  . Multiple Vitamin (MULTIVITAMIN) tablet Take 1 tablet by mouth daily.        Marland Kitchen DISCONTD: lisinopril (PRINIVIL,ZESTRIL) 10 MG tablet Take 10 mg by mouth daily.      Marland Kitchen DISCONTD: pravastatin (PRAVACHOL) 40 MG tablet Take 40 mg by mouth daily.      . Vitamin D, Ergocalciferol, (DRISDOL) 50000 UNITS CAPS Take 50,000 Units by mouth every 7 (seven) days.        No  Known Allergies  Past Medical History  Diagnosis Date  . Coronary artery disease     s/p CABG in 2005; last cath in 2008 showing occlusion of all SVG's;   . Diabetes mellitus   . Hyperlipidemia   . Hypertension   . Noncompliance   . LV dysfunction     EF 30 to 35% per echo 3/13  . Dizzy   . Hypoglycemia     Past Surgical History  Procedure Date  . Coronary artery bypass graft 2005    THIS INCLUDED SAPHENOUS VEIN GRAFT TO THE PDA, SAPHENOUS VEIN GRAFT TO OBTUSE MARGINAL VESSEL, AS LIMA GRAFT TO THE LAD.  Marland Kitchen Cardiac catheterization 2008    EF 50-55%. SHOWED OCCLUSION OF VEIN GRAFT  . Cholecystectomy   . Transthoracic echocardiogram 05/04/2004    EF 50-55%  . Cardiovascular stress test 12/24/2006    EF 47%    History  Smoking status  . Never Smoker   Smokeless tobacco  . Never Used    History  Alcohol Use No    Family History  Problem Relation Age of Onset  . Tuberculosis Mother   . Lung cancer Father   . Diabetes Sister   . Heart attack Brother   .  Hypertension Brother   . Diabetes Brother     Review of Systems: The review of systems is per the HPI.  All other systems were reviewed and are negative.  Physical Exam: BP 128/64  Pulse 76  Ht 5\' 2"  (1.575 m)  Wt 148 lb (67.132 kg)  BMI 27.07 kg/m2 Patient is very pleasant and in no acute distress. Her weight is down 10 pounds since her last visit. Skin is warm and dry. Color is normal.  HEENT is unremarkable. Normocephalic/atraumatic. PERRL. Sclera are nonicteric. Neck is supple. No masses. No JVD. Lungs are clear. Cardiac exam shows a regular rate and rhythm. Abdomen is soft. Extremities are without edema. Gait and ROM are intact. No gross neurologic deficits noted.   LABORATORY DATA: PENDING    Assessment / Plan: 1. Ischemic CM - EF is 20 to 35%. She is on ACE and BB. Not sure if she is actually taking. We went ahead and refilled them today. Seems to be doing ok clinically. Will see her back in 4 months.    2. Noncompliance - this has been a long term issue. Her cardiac medicines are refilled today.   3. HTN - No change with her current regimen.   We will check labs today. See her back in 4 months. Patient is agreeable to this plan and will call if any problems develop in the interim.

## 2012-02-07 LAB — HEPATIC FUNCTION PANEL
ALT: 13 U/L (ref 0–35)
AST: 10 U/L (ref 0–37)
Albumin: 4.2 g/dL (ref 3.5–5.2)
Alkaline Phosphatase: 66 U/L (ref 39–117)
Bilirubin, Direct: 0.1 mg/dL (ref 0.0–0.3)
Total Bilirubin: 0.7 mg/dL (ref 0.3–1.2)
Total Protein: 7.4 g/dL (ref 6.0–8.3)

## 2012-02-07 LAB — BASIC METABOLIC PANEL
BUN: 21 mg/dL (ref 6–23)
CO2: 23 mEq/L (ref 19–32)
Calcium: 9.6 mg/dL (ref 8.4–10.5)
Chloride: 108 mEq/L (ref 96–112)
Creatinine, Ser: 0.9 mg/dL (ref 0.4–1.2)
GFR: 82.15 mL/min (ref >60.00)
Glucose, Bld: 152 mg/dL — ABNORMAL HIGH (ref 70–99)
Potassium: 4.8 mEq/L (ref 3.5–5.1)
Sodium: 140 mEq/L (ref 135–145)

## 2012-02-07 LAB — LIPID PANEL
Cholesterol: 250 mg/dL — ABNORMAL HIGH (ref 0–200)
HDL: 50.1 mg/dL (ref >39.00)
Total CHOL/HDL Ratio: 5
Triglycerides: 181 mg/dL — ABNORMAL HIGH (ref 0.0–149.0)
VLDL: 36.2 mg/dL (ref 0.0–40.0)

## 2012-02-08 ENCOUNTER — Telehealth: Payer: Self-pay | Admitting: *Deleted

## 2012-02-08 LAB — LDL CHOLESTEROL, DIRECT: Direct LDL: 173.7 mg/dL

## 2012-02-08 NOTE — Telephone Encounter (Signed)
Message copied by Awilda Bill on Fri Feb 08, 2012  9:07 AM ------      Message from: Rosalio Macadamia      Created: Fri Feb 08, 2012  7:44 AM       Ok to report. Labs are satisfactory except for the lipids. I do not think she had been taking the pravachol. It was refilled at her OV. Please stress to her the importance of taking.

## 2012-02-08 NOTE — Telephone Encounter (Signed)
Follow-up:    Patient's brother returned your call.  Please call back.

## 2012-02-08 NOTE — Telephone Encounter (Signed)
Left msg with patients brother for her to call back.Vista Mink, CMA

## 2012-02-08 NOTE — Telephone Encounter (Signed)
They rtn your call

## 2012-02-08 NOTE — Telephone Encounter (Signed)
Placed follow up call for Mckenzie Williamson.  She is unavailable but according to her brother she did get the message and will call again later.  Vista Mink, CMA

## 2012-02-11 ENCOUNTER — Encounter: Payer: Self-pay | Admitting: *Deleted

## 2012-03-06 ENCOUNTER — Other Ambulatory Visit: Payer: Self-pay | Admitting: *Deleted

## 2012-03-06 MED ORDER — PRAVASTATIN SODIUM 40 MG PO TABS: 40.0000 mg | ORAL_TABLET | Freq: Every day | ORAL | Status: DC

## 2012-05-19 ENCOUNTER — Ambulatory Visit: Payer: Medicare Other | Admitting: Cardiology

## 2012-06-04 ENCOUNTER — Encounter: Payer: Self-pay | Admitting: Cardiology

## 2012-08-20 ENCOUNTER — Emergency Department (HOSPITAL_COMMUNITY)
Admission: EM | Admit: 2012-08-20 | Discharge: 2012-08-20 | Discharge status: Against Medical Advice | Payer: Medicare Other

## 2012-10-09 ENCOUNTER — Encounter: Payer: Self-pay | Admitting: *Deleted

## 2013-12-01 ENCOUNTER — Encounter: Payer: Self-pay | Admitting: Internal Medicine

## 2013-12-01 ENCOUNTER — Ambulatory Visit (INDEPENDENT_AMBULATORY_CARE_PROVIDER_SITE_OTHER): Payer: Medicare Other | Admitting: Internal Medicine

## 2013-12-01 VITALS — BP 172/86 | HR 90 | Temp 98.2°F | Wt 157.8 lb

## 2013-12-01 DIAGNOSIS — E785 Hyperlipidemia, unspecified: Secondary | ICD-10-CM

## 2013-12-01 DIAGNOSIS — I2510 Atherosclerotic heart disease of native coronary artery without angina pectoris: Secondary | ICD-10-CM

## 2013-12-01 DIAGNOSIS — Z9119 Patient's noncompliance with other medical treatment and regimen: Secondary | ICD-10-CM

## 2013-12-01 DIAGNOSIS — I25119 Atherosclerotic heart disease of native coronary artery with unspecified angina pectoris: Secondary | ICD-10-CM

## 2013-12-01 DIAGNOSIS — E119 Type 2 diabetes mellitus without complications: Secondary | ICD-10-CM

## 2013-12-01 DIAGNOSIS — Z91199 Patient's noncompliance with other medical treatment and regimen due to unspecified reason: Secondary | ICD-10-CM

## 2013-12-01 DIAGNOSIS — I209 Angina pectoris, unspecified: Secondary | ICD-10-CM

## 2013-12-01 DIAGNOSIS — I10 Essential (primary) hypertension: Secondary | ICD-10-CM

## 2013-12-01 LAB — GLUCOSE, POCT (MANUAL RESULT ENTRY): POC GLUCOSE: 507 mg/dL — AB (ref 70–99)

## 2013-12-01 MED ORDER — LISINOPRIL 10 MG PO TABS: 10.0000 mg | ORAL_TABLET | Freq: Every day | ORAL | Status: DC

## 2013-12-01 MED ORDER — OMEPRAZOLE 20 MG PO CPDR: 20.0000 mg | DELAYED_RELEASE_CAPSULE | Freq: Every day | ORAL | Status: DC

## 2013-12-01 MED ORDER — ASPIRIN EC 325 MG PO TBEC: 325.0000 mg | DELAYED_RELEASE_TABLET | Freq: Every day | ORAL | Status: DC

## 2013-12-01 MED ORDER — LINAGLIPTIN 5 MG PO TABS: 5.0000 mg | ORAL_TABLET | Freq: Every day | ORAL | Status: DC

## 2013-12-01 MED ORDER — PRAVASTATIN SODIUM 40 MG PO TABS: 40.0000 mg | ORAL_TABLET | Freq: Every day | ORAL | Status: DC

## 2013-12-01 MED ORDER — METFORMIN HCL 500 MG PO TABS: 500.0000 mg | ORAL_TABLET | Freq: Two times a day (BID) | ORAL | Status: DC

## 2013-12-02 ENCOUNTER — Other Ambulatory Visit: Payer: Self-pay | Admitting: Internal Medicine

## 2013-12-02 DIAGNOSIS — Z9114 Patient's other noncompliance with medication regimen: Secondary | ICD-10-CM

## 2013-12-02 DIAGNOSIS — IMO0002 Reserved for concepts with insufficient information to code with codable children: Secondary | ICD-10-CM

## 2013-12-02 DIAGNOSIS — E1165 Type 2 diabetes mellitus with hyperglycemia: Secondary | ICD-10-CM

## 2013-12-02 LAB — CBC WITH DIFFERENTIAL/PLATELET
BASOS: 0 %
Basophils Absolute: 0 10*3/uL (ref 0.0–0.2)
Eos: 1 %
Eosinophils Absolute: 0.1 10*3/uL (ref 0.0–0.4)
HEMATOCRIT: 36.6 % (ref 34.0–46.6)
Hemoglobin: 12 g/dL (ref 11.1–15.9)
IMMATURE GRANULOCYTES: 0 %
Immature Grans (Abs): 0 10*3/uL (ref 0.0–0.1)
LYMPHS ABS: 2.7 10*3/uL (ref 0.7–3.1)
Lymphs: 40 %
MCH: 28 pg (ref 26.6–33.0)
MCHC: 32.8 g/dL (ref 31.5–35.7)
MCV: 86 fL (ref 79–97)
MONOS ABS: 0.3 10*3/uL (ref 0.1–0.9)
Monocytes: 5 %
Neutrophils Absolute: 3.7 10*3/uL (ref 1.4–7.0)
Neutrophils Relative %: 54 %
RBC: 4.28 x10E6/uL (ref 3.77–5.28)
RDW: 13.6 % (ref 12.3–15.4)
WBC: 6.8 10*3/uL (ref 3.4–10.8)

## 2013-12-02 LAB — LIPID PANEL
Chol/HDL Ratio: 7.5 ratio units — ABNORMAL HIGH (ref 0.0–4.4)
Cholesterol, Total: 294 mg/dL — ABNORMAL HIGH (ref 100–199)
HDL: 39 mg/dL — AB (ref >39)
LDL Calculated: 184 mg/dL — ABNORMAL HIGH (ref 0–99)
TRIGLYCERIDES: 355 mg/dL — AB (ref 0–149)
VLDL Cholesterol Cal: 71 mg/dL — ABNORMAL HIGH (ref 5–40)

## 2013-12-02 LAB — COMPREHENSIVE METABOLIC PANEL
A/G RATIO: 1.4 (ref 1.1–2.5)
ALT: 10 IU/L (ref 0–32)
AST: 9 IU/L (ref 0–40)
Albumin: 3.8 g/dL (ref 3.5–4.8)
Alkaline Phosphatase: 130 IU/L — ABNORMAL HIGH (ref 39–117)
BUN/Creatinine Ratio: 16 (ref 11–26)
BUN: 14 mg/dL (ref 8–27)
CALCIUM: 9.6 mg/dL (ref 8.7–10.3)
CHLORIDE: 96 mmol/L — AB (ref 97–108)
CO2: 22 mmol/L (ref 18–29)
Creatinine, Ser: 0.9 mg/dL (ref 0.57–1.00)
GFR calc Af Amer: 70 mL/min/{1.73_m2} (ref >59)
GFR calc non Af Amer: 61 mL/min/{1.73_m2} (ref >59)
Globulin, Total: 2.7 g/dL (ref 1.5–4.5)
Glucose: 466 mg/dL — ABNORMAL HIGH (ref 65–99)
POTASSIUM: 4.7 mmol/L (ref 3.5–5.2)
SODIUM: 134 mmol/L (ref 134–144)
Total Bilirubin: <0.2 mg/dL (ref 0.0–1.2)
Total Protein: 6.5 g/dL (ref 6.0–8.5)

## 2013-12-02 LAB — TSH: TSH: 0.928 u[IU]/mL (ref 0.450–4.500)

## 2013-12-02 LAB — MICROALBUMIN / CREATININE URINE RATIO
Creatinine, Ur: 34.2 mg/dL (ref 15.0–278.0)
MICROALB/CREAT RATIO: 29.8 mg/g{creat} (ref 0.0–30.0)
MICROALBUM., U, RANDOM: 10.2 ug/mL (ref 0.0–17.0)

## 2013-12-02 LAB — HEMOGLOBIN A1C
Est. average glucose Bld gHb Est-mCnc: 404 mg/dL
Hgb A1c MFr Bld: 15.7 % — ABNORMAL HIGH (ref 4.8–5.6)

## 2013-12-02 NOTE — Progress Notes (Signed)
Patient ID: Mckenzie Williamson, female   DOB: Aug 12, 1932, 80 y.o.   MRN: [id number]    Chief Complaint  Patient presents with  . Acute Visit    elevated sugar arround 300-400 x 3 weeks. also having vision issues and needs to see a eye doctor.   No Known Allergies  HPI 80 y/o female patient is here for acute follow up. She has not been seen in our clinic for almost 2 years. She has hx of poorly controlled dm, HTN, CAD among others. Se lost her husband a year and a half back and then stopped taking all her medications. She has been out of all her medications since then. She has been checking her sugar at home recently and it has been in 400 and 500 range. Se is here to evaluate this further and start medications. Her daughter is here with her this visit. She lives in [place] and is here to visit her. She wants to take the pt to [place] with her but it will take 4-6 months before this happens. For now, she is living with her son and they are not involved in her care. She also has dementia. Has been having blurry vision for few months. Denies eye pain or discharge  Review of Systems  Constitutional: Negative for fever, chills, weight loss, diaphoresis. feels tired HENT: Negative for congestion, hearing loss and sore throat.   Eyes: Negative for eye pain.  Respiratory: Negative for cough, sputum production, shortness of breath and wheezing.   Cardiovascular: had chest pain few days back and discomfort in middle of her chest this am as well. No chest pain at present. Negative for palpitations, orthopnea and leg swelling.  Gastrointestinal: Negative for  vomiting, abdominal pain, diarrhea and constipation. has frequent heartburn and occassional nausea Genitourinary: Negative for dysuria, urgency, frequency and flank pain.  Musculoskeletal: Negative for back pain, falls Skin: Negative for itching and rash.  Neurological: Negative for dizziness, tingling, focal weakness and headaches.    Psychiatric/Behavioral: The patient is not nervous/anxious.    Wt Readings from Last 3 Encounters:  12/01/13 157 lb 12.8 oz (71.578 kg)  02/06/12 148 lb (67.132 kg)  11/07/11 158 lb 6.4 oz (71.85 kg)   Past Medical History  Diagnosis Date  . Coronary artery disease     s/p CABG in 2005; last cath in 2008 showing occlusion of all SVG's;   . Diabetes mellitus   . Hyperlipidemia   . Hypertension   . Noncompliance   . LV dysfunction     EF 30 to 35% per echo 3/13  . Dizzy   . Hypoglycemia   . Cataract    [facility]. [facility]  Physical exam BP 172/86  Pulse 90  Temp(Src) 98.2 F (36.8 C) (Oral)  Wt 157 lb 12.8 oz (71.578 kg)  General- elderly female in no acute distress Head- atraumatic, normocephalic Eyes- PERRLA, EOMI, no pallor, no icterus, no discharge Neck- no lymphadenopathy Mouth- normal mucus membrane, no oral thrush Cardiovascular- normal s1,s2, no murmurs, normal distal pulses, no reproducible chest pain Respiratory- bilateral clear to auscultation, no wheeze, no rhonchi, no crackles Abdomen- bowel sounds present, soft, non tender Musculoskeletal- able to move all 4 extremities,trace leg edema Neurological- no focal deficit Skin- warm and dry Psychiatry- alert and oriented to person, place and time, normal mood and affect  Labs- no recent labs 09/04/11 wbc 4.8, hb 11.5, glu 132, bun 17, cr 0.89, a1c 15.3, t.chol 223, tg 205, hdl 46, ldl 136, tsh  1.22, vit d 16.1  12/01/13 ekg NSR, LAD, QRS changes in inferior leads unchanged from before  Assessment/plan  1. DM type 2 goal A1C below 7.5 cbg in office in 500s. Will start her on metformin 500 mg bid and tradjenta 5 mg daily for now. Check a1c, lipid and consider insulin if needed. Will get home health service referral to help with med management given her memory and non compliance history. Referral to eye doctor for retinal exam - CMP - Lipid Panel - CBC with Differential - TSH - Ambulatory referral to  Ophthalmology - Hemoglobin A1c - Microalbumin/Creatinine Ratio, Urine - POC Glucose (CBG)  2. Essential hypertension, benign Elevated bp readings. Will start her on lisinopril 5 mg daily for now. Home bp monitoring recommended. reassess  3. Coronary artery disease with unspecified angina pectoris EKG today shows no new changes. Off all meds. Restart aspirin 325 mg daily, lisinopril 5 mg daily and pravastatin. Reassess and consider introduction of b blocker next visit  4. Hyperlipidemia Check lipid panel. Restart pravachol 40 mg daily for now - Lipid Panel - TSH  5. Noncompliance Off meds for more than a year. Indication for each med explained to pt and daughter. Her memory can be a limitation to med compliance. Reintroduced her prior medications at lower dosing for now. Lab work ordered. Home health referral provided. Follow on labs and schedule f/u visit to review this and adjust med further. Also talked about having pt be in an assisted living or SNF but pt and family not willing at present.. Will get formal memory test in upcoming office visit

## 2013-12-03 ENCOUNTER — Telehealth: Payer: Self-pay

## 2013-12-03 MED ORDER — GLUCOSE BLOOD VI STRP: ORAL_STRIP | Status: DC

## 2013-12-03 MED ORDER — ONETOUCH DELICA LANCETS FINE MISC: Status: DC

## 2013-12-03 MED ORDER — ONETOUCH ULTRA SYSTEM W/DEVICE KIT: 1.0000 | PACK | Freq: Once | Status: DC

## 2013-12-03 NOTE — Telephone Encounter (Signed)
Message copied by Maurice SmallBEATTY, Noeh Sparacino C on Thu Dec 03, 2013  8:34 AM ------      Message from: Oneal GroutPANDEY, MAHIMA      Created: Wed Dec 02, 2013 11:42 AM       Your sugar and cholesterol level are very poorly controlled. This is because of you stopping the medication for sometime now. Take your medication as prescribed. When i see you on Tuesday we will start you on insulin. Can we make sure she has glucometer, test strips and lancets, if not- send script. Also can we have home health services arranged for her to help with medication management and glucose monitoring. Have pt bring readings on follow up visit. thanks ------

## 2013-12-03 NOTE — Telephone Encounter (Signed)
Discussed with patient's daughter, RX's for diabetic testing supplies sent to pharmacy. Copy of report to be mailed.

## 2013-12-07 DIAGNOSIS — E1165 Type 2 diabetes mellitus with hyperglycemia: Secondary | ICD-10-CM

## 2013-12-07 DIAGNOSIS — I10 Essential (primary) hypertension: Secondary | ICD-10-CM

## 2013-12-07 DIAGNOSIS — I509 Heart failure, unspecified: Secondary | ICD-10-CM

## 2013-12-07 DIAGNOSIS — IMO0001 Reserved for inherently not codable concepts without codable children: Secondary | ICD-10-CM

## 2013-12-07 DIAGNOSIS — I25810 Atherosclerosis of coronary artery bypass graft(s) without angina pectoris: Secondary | ICD-10-CM

## 2013-12-08 ENCOUNTER — Encounter: Payer: Self-pay | Admitting: Internal Medicine

## 2013-12-08 ENCOUNTER — Ambulatory Visit (INDEPENDENT_AMBULATORY_CARE_PROVIDER_SITE_OTHER): Payer: Medicare Other | Admitting: Internal Medicine

## 2013-12-08 VITALS — BP 146/80 | HR 92 | Temp 97.8°F | Wt 158.0 lb

## 2013-12-08 DIAGNOSIS — E119 Type 2 diabetes mellitus without complications: Secondary | ICD-10-CM | POA: Insufficient documentation

## 2013-12-08 DIAGNOSIS — I209 Angina pectoris, unspecified: Secondary | ICD-10-CM

## 2013-12-08 DIAGNOSIS — E1065 Type 1 diabetes mellitus with hyperglycemia: Secondary | ICD-10-CM | POA: Insufficient documentation

## 2013-12-08 DIAGNOSIS — I10 Essential (primary) hypertension: Secondary | ICD-10-CM

## 2013-12-08 DIAGNOSIS — E108 Type 1 diabetes mellitus with unspecified complications: Secondary | ICD-10-CM

## 2013-12-08 DIAGNOSIS — I2510 Atherosclerotic heart disease of native coronary artery without angina pectoris: Secondary | ICD-10-CM

## 2013-12-08 DIAGNOSIS — IMO0002 Reserved for concepts with insufficient information to code with codable children: Secondary | ICD-10-CM | POA: Insufficient documentation

## 2013-12-08 DIAGNOSIS — E785 Hyperlipidemia, unspecified: Secondary | ICD-10-CM

## 2013-12-08 MED ORDER — INSULIN GLARGINE 100 UNIT/ML SOLOSTAR PEN: 10.0000 [IU] | PEN_INJECTOR | Freq: Every day | SUBCUTANEOUS | Status: DC

## 2013-12-08 MED ORDER — INSULIN PEN NEEDLE 31G X 5 MM MISC: Status: DC

## 2013-12-08 NOTE — Progress Notes (Signed)
Patient ID: Mckenzie Williamson, female   DOB: 02-May-1933, 80 y.o.   MRN: [id number]    Chief Complaint  Patient presents with  . Medication Management    1 week follow-up on medicaitons and diagnosis   . Results    Discuss labs    No Known Allergies  HPI 80 y/o female patient is here for follow up. She has DM, HTN, HL, CAD. She was lost to follow up for 2 years and was back last week with elevtaed blood sugar. Has eye appointment on 12/28/13. Taking her medication now. Here with her son. She mentions checking her blood suagr once a day and does not remember her morning sugar. Her son checked it but he does not remember it either they think it was fair number. She mentions sugar being in 200-400 range mostly.  Reflux symptom better controlled Reviewed med list with patient Home health care coming from tomorrow to assist her  Review of Systems  Constitutional: Negative for fever, chills, weight loss, diaphoresis.  HENT: Negative for congestion, hearing loss and sore throat.   Eyes: Negative for eye pain.  Respiratory: Negative for cough, sputum production, shortness of breath and wheezing.   Cardiovascular: had chest pain few days back and discomfort in middle of her chest this am as well. No chest pain at present. Negative for palpitations, orthopnea and leg swelling.  Gastrointestinal: Negative for  vomiting, abdominal pain, diarrhea and constipation. has frequent heartburn and occassional nausea Genitourinary: Negative for dysuria, urgency, frequency and flank pain.  Musculoskeletal: Negative for back pain, falls Skin: Negative for itching and rash.  Neurological: Negative for dizziness, tingling, focal weakness and headaches.  Psychiatric/Behavioral: The patient is not nervous/anxious.      Current Outpatient Prescriptions on File Prior to Visit  Medication Sig Dispense Refill  . aspirin EC 325 MG tablet Take 1 tablet (325 mg total) by mouth daily.  30 tablet  0  . Blood  Glucose Monitoring Suppl (ONE TOUCH ULTRA SYSTEM KIT) W/DEVICE KIT 1 kit by Does not apply route once. Check blood sugar once daily as directed DX 250.00  1 each  0  . glucose blood (ONE TOUCH ULTRA TEST) test strip Check blood sugar once daily as directed DX 250.00  100 each  11  . linagliptin (TRADJENTA) 5 MG TABS tablet Take 1 tablet (5 mg total) by mouth daily.  30 tablet  0  . lisinopril (PRINIVIL,ZESTRIL) 10 MG tablet Take 1 tablet (10 mg total) by mouth daily.  30 tablet  6  . metFORMIN (GLUCOPHAGE) 500 MG tablet Take 1 tablet (500 mg total) by mouth 2 (two) times daily with a meal.  60 tablet  3  . Multiple Vitamin (MULTIVITAMIN) tablet Take 1 tablet by mouth daily.        Marland Kitchen omeprazole (PRILOSEC) 20 MG capsule Take 1 capsule (20 mg total) by mouth daily.  30 capsule  3  . ONETOUCH DELICA LANCETS FINE MISC Check blood sugar once daily as directed DX 250.00  100 each  11  . pravastatin (PRAVACHOL) 40 MG tablet Take 1 tablet (40 mg total) by mouth daily.  30 tablet  6   No current facility-administered medications on file prior to visit.   Physical exam BP 146/80  Pulse 92  Temp(Src) 97.8 F (36.6 C) (Oral)  Wt 158 lb (71.668 kg)  SpO2 96%  General- elderly female in no acute distress Head- atraumatic, normocephalic Eyes- PERRLA, EOMI, no pallor, no icterus, no discharge Neck- no  lymphadenopathy Mouth- normal mucus membrane, no oral thrush Cardiovascular- normal s1,s2, no murmurs, normal distal pulses, no reproducible chest pain Respiratory- bilateral clear to auscultation, no wheeze, no rhonchi, no crackles Abdomen- bowel sounds present, soft, non tender Musculoskeletal- able to move all 4 extremities,trace leg edema Neurological- no focal deficit, normal foot exam, good distal pulses Skin- warm and dry Psychiatry- alert and oriented to person, place and time, normal mood and affect  Labs- no recent labs 09/04/11 wbc 4.8, hb 11.5, glu 132, bun 17, cr 0.89, a1c 15.3, t.chol 223,  tg 205, hdl 46, ldl 136, tsh 1.22, vit d 16.1  12/01/13 ekg NSR, LAD, QRS changes in inferior leads unchanged from before  Lab Results  Component Value Date   HGBA1C 15.7* 12/01/2013   Lab Results  Component Value Date   CREATININE 0.90 12/01/2013   Lab Results  Component Value Date   WBC 6.8 12/01/2013   HGB 12.0 12/01/2013   HCT 36.6 12/01/2013   MCV 86 12/01/2013   PLT 287 10/06/2011   CMP     Component Value Date/Time   NA 134 12/01/2013 1700   NA 140 02/06/2012 1022   K 4.7 12/01/2013 1700   CL 96* 12/01/2013 1700   CO2 22 12/01/2013 1700   GLUCOSE 466* 12/01/2013 1700   GLUCOSE 152* 02/06/2012 1022   BUN 14 12/01/2013 1700   BUN 21 02/06/2012 1022   CREATININE 0.90 12/01/2013 1700   CALCIUM 9.6 12/01/2013 1700   PROT 6.5 12/01/2013 1700   PROT 7.4 02/06/2012 1022   ALBUMIN 4.2 02/06/2012 1022   AST 9 12/01/2013 1700   ALT 10 12/01/2013 1700   ALKPHOS 130* 12/01/2013 1700   BILITOT <0.2 12/01/2013 1700   GFRNONAA 61 12/01/2013 1700   GFRAA 70 12/01/2013 1700   Lipid Panel     Component Value Date/Time   CHOL 250* 02/06/2012 1022   TRIG 355* 12/01/2013 1700   HDL 39* 12/01/2013 1700   HDL 50.10 02/06/2012 1022   CHOLHDL 7.5* 12/01/2013 1700   CHOLHDL 5 02/06/2012 1022   VLDL 36.2 02/06/2012 1022   LDLCALC 184* 12/01/2013 1700   Normal urine microalbumin  Assessment/plan  1. Type 2 diabetes mellitus not at goal Poorly controlled. Reviewed microalbumin. Normal foot exam. Continue metofrmin 500 mg bid and tradjenta. Also to introduce lantus 10 u sq daily for now. Diabetic teaching with Tivis Ringer next week scheduled. Continue aspirin, lisinopril and statin for now  2. Essential hypertension, benign Stable. Continue lisinopril 5 mg daily for now. Home bp monitoring recommended. reassess   3. Hyperlipidemia Continue pravachol 40 mg daily for now

## 2013-12-09 ENCOUNTER — Encounter: Payer: Self-pay | Admitting: Internal Medicine

## 2013-12-14 ENCOUNTER — Ambulatory Visit: Payer: Medicare Other | Admitting: Pharmacotherapy

## 2013-12-18 ENCOUNTER — Other Ambulatory Visit: Payer: Self-pay | Admitting: Internal Medicine

## 2013-12-21 ENCOUNTER — Ambulatory Visit: Payer: Self-pay | Admitting: Pharmacotherapy

## 2014-02-10 ENCOUNTER — Ambulatory Visit: Payer: Medicare Other | Admitting: Internal Medicine

## 2014-02-10 ENCOUNTER — Encounter: Payer: Self-pay | Admitting: Internal Medicine

## 2014-04-06 ENCOUNTER — Emergency Department (HOSPITAL_COMMUNITY)
Admission: EM | Admit: 2014-04-06 | Discharge: 2014-04-06 | Disposition: A | Discharge status: Home / Self Care | Payer: Medicare Other | Attending: Emergency Medicine | Admitting: Emergency Medicine

## 2014-04-06 ENCOUNTER — Encounter (HOSPITAL_COMMUNITY): Payer: Self-pay

## 2014-04-06 DIAGNOSIS — I10 Essential (primary) hypertension: Secondary | ICD-10-CM | POA: Insufficient documentation

## 2014-04-06 DIAGNOSIS — E785 Hyperlipidemia, unspecified: Secondary | ICD-10-CM | POA: Insufficient documentation

## 2014-04-06 DIAGNOSIS — Z79899 Other long term (current) drug therapy: Secondary | ICD-10-CM | POA: Diagnosis not present

## 2014-04-06 DIAGNOSIS — Z9889 Other specified postprocedural states: Secondary | ICD-10-CM | POA: Insufficient documentation

## 2014-04-06 DIAGNOSIS — E860 Dehydration: Secondary | ICD-10-CM | POA: Diagnosis not present

## 2014-04-06 DIAGNOSIS — R1084 Generalized abdominal pain: Secondary | ICD-10-CM | POA: Insufficient documentation

## 2014-04-06 DIAGNOSIS — Z8669 Personal history of other diseases of the nervous system and sense organs: Secondary | ICD-10-CM | POA: Insufficient documentation

## 2014-04-06 DIAGNOSIS — E1165 Type 2 diabetes mellitus with hyperglycemia: Secondary | ICD-10-CM | POA: Diagnosis not present

## 2014-04-06 DIAGNOSIS — Z794 Long term (current) use of insulin: Secondary | ICD-10-CM | POA: Diagnosis not present

## 2014-04-06 DIAGNOSIS — Z9049 Acquired absence of other specified parts of digestive tract: Secondary | ICD-10-CM | POA: Diagnosis not present

## 2014-04-06 DIAGNOSIS — Z951 Presence of aortocoronary bypass graft: Secondary | ICD-10-CM | POA: Diagnosis not present

## 2014-04-06 DIAGNOSIS — I2510 Atherosclerotic heart disease of native coronary artery without angina pectoris: Secondary | ICD-10-CM | POA: Diagnosis not present

## 2014-04-06 DIAGNOSIS — R251 Tremor, unspecified: Secondary | ICD-10-CM | POA: Diagnosis present

## 2014-04-06 DIAGNOSIS — R739 Hyperglycemia, unspecified: Secondary | ICD-10-CM

## 2014-04-06 LAB — CBC WITH DIFFERENTIAL/PLATELET
Basophils Absolute: 0 10*3/uL (ref 0.0–0.1)
Basophils Relative: 1 % (ref 0–1)
EOS PCT: 2 % (ref 0–5)
Eosinophils Absolute: 0.1 10*3/uL (ref 0.0–0.7)
HEMATOCRIT: 36.9 % (ref 36.0–46.0)
Hemoglobin: 12.4 g/dL (ref 12.0–15.0)
LYMPHS ABS: 1.8 10*3/uL (ref 0.7–4.0)
Lymphocytes Relative: 45 % (ref 12–46)
MCH: 28.1 pg (ref 26.0–34.0)
MCHC: 33.6 g/dL (ref 30.0–36.0)
MCV: 83.7 fL (ref 78.0–100.0)
MONOS PCT: 5 % (ref 3–12)
Monocytes Absolute: 0.2 10*3/uL (ref 0.1–1.0)
NEUTROS ABS: 2 10*3/uL (ref 1.7–7.7)
Neutrophils Relative %: 47 % (ref 43–77)
Platelets: UNDETERMINED 10*3/uL (ref 150–400)
RBC: 4.41 MIL/uL (ref 3.87–5.11)
RDW: 12.5 % (ref 11.5–15.5)
WBC: 4.1 10*3/uL (ref 4.0–10.5)

## 2014-04-06 LAB — COMPREHENSIVE METABOLIC PANEL
ALBUMIN: 3 g/dL — AB (ref 3.5–5.2)
ALT: 7 U/L (ref 0–35)
AST: 6 U/L (ref 0–37)
Alkaline Phosphatase: 124 U/L — ABNORMAL HIGH (ref 39–117)
Anion gap: 13 (ref 5–15)
BUN: 9 mg/dL (ref 6–23)
CHLORIDE: 100 meq/L (ref 96–112)
CO2: 24 mEq/L (ref 19–32)
CREATININE: 0.68 mg/dL (ref 0.50–1.10)
Calcium: 9.1 mg/dL (ref 8.4–10.5)
GFR calc Af Amer: >90 mL/min (ref >90)
GFR calc non Af Amer: 81 mL/min — ABNORMAL LOW (ref >90)
Glucose, Bld: 421 mg/dL — ABNORMAL HIGH (ref 70–99)
POTASSIUM: 4.2 meq/L (ref 3.7–5.3)
Sodium: 137 mEq/L (ref 137–147)
TOTAL PROTEIN: 6.6 g/dL (ref 6.0–8.3)
Total Bilirubin: 0.5 mg/dL (ref 0.3–1.2)

## 2014-04-06 LAB — LIPASE, BLOOD: LIPASE: 59 U/L (ref 11–59)

## 2014-04-06 LAB — URINALYSIS, ROUTINE W REFLEX MICROSCOPIC
Bilirubin Urine: NEGATIVE
HGB URINE DIPSTICK: NEGATIVE
Ketones, ur: 15 mg/dL — AB
Leukocytes, UA: NEGATIVE
Nitrite: NEGATIVE
Protein, ur: NEGATIVE mg/dL
SPECIFIC GRAVITY, URINE: 1.034 — AB (ref 1.005–1.030)
UROBILINOGEN UA: 0.2 mg/dL (ref 0.0–1.0)
pH: 6 (ref 5.0–8.0)

## 2014-04-06 LAB — URINE MICROSCOPIC-ADD ON

## 2014-04-06 LAB — CBG MONITORING, ED
Glucose-Capillary: 301 mg/dL — ABNORMAL HIGH (ref 70–99)
Glucose-Capillary: 263 mg/dL — ABNORMAL HIGH (ref 70–99)

## 2014-04-06 MED ORDER — PRAVASTATIN SODIUM 40 MG PO TABS: 40.0000 mg | ORAL_TABLET | Freq: Every day | ORAL | Status: DC

## 2014-04-06 MED ORDER — SODIUM CHLORIDE 0.9 % IV SOLN
INTRAVENOUS | Status: DC
  Administered 2014-04-06: 1000 mL via INTRAVENOUS

## 2014-04-06 MED ORDER — OMEPRAZOLE 20 MG PO CPDR: 20.0000 mg | DELAYED_RELEASE_CAPSULE | Freq: Every day | ORAL | Status: DC

## 2014-04-06 MED ORDER — INSULIN ASPART 100 UNIT/ML ~~LOC~~ SOLN
5.0000 [IU] | Freq: Once | SUBCUTANEOUS | Status: AC
  Administered 2014-04-06: 5 [IU] via SUBCUTANEOUS
  Filled 2014-04-06: qty 1

## 2014-04-06 MED ORDER — INSULIN PEN NEEDLE 31G X 5 MM MISC: Status: DC

## 2014-04-06 MED ORDER — METFORMIN HCL 500 MG PO TABS: 500.0000 mg | ORAL_TABLET | Freq: Two times a day (BID) | ORAL | Status: DC

## 2014-04-06 MED ORDER — SODIUM CHLORIDE 0.9 % IV BOLUS (SEPSIS)
1000.0000 mL | Freq: Once | INTRAVENOUS | Status: AC
  Administered 2014-04-06: 1000 mL via INTRAVENOUS

## 2014-04-06 MED ORDER — SODIUM CHLORIDE 0.9 % IV SOLN
INTRAVENOUS | Status: DC
  Administered 2014-04-06: 3.6 [IU]/h via INTRAVENOUS
  Filled 2014-04-06: qty 2.5

## 2014-04-06 MED ORDER — LINAGLIPTIN 5 MG PO TABS: 5.0000 mg | ORAL_TABLET | Freq: Every day | ORAL | Status: DC

## 2014-04-06 MED ORDER — INSULIN GLARGINE 100 UNIT/ML SOLOSTAR PEN: 10.0000 [IU] | PEN_INJECTOR | Freq: Every day | SUBCUTANEOUS | Status: AC

## 2014-04-06 MED ORDER — LISINOPRIL 10 MG PO TABS: 10.0000 mg | ORAL_TABLET | Freq: Every day | ORAL | Status: DC

## 2014-04-06 NOTE — ED Notes (Signed)
D&C IV 

## 2014-04-06 NOTE — ED Notes (Signed)
Pt unable to esign, pulled into hallway. Verbalized understanding.

## 2014-04-06 NOTE — ED Provider Notes (Signed)
CSN: [id number]     Arrival date & time 04/06/14  0750 History   First MD Initiated Contact with Patient 04/06/14 0804     Chief Complaint  Patient presents with  . Tremors     (Consider location/radiation/quality/duration/timing/severity/associated sxs/prior Treatment) HPI  Patient reports she has not felt well for a few days. She reports frequency and lower abdominal pressure that gets worse when she urinates and then it makes her shake all over from the pain. She denies fever, nausea, vomiting, although she states she has been having diarrhea about 3 times a day. The diarrhea is described as loose and there is no obvious blood or melena present. She states she feels dizzy, lightheaded, and weak. She complains of feeling thirsty.She states the pain is worse when she walks. Nothing makes it feel better.she has never had these symptoms before. She reports she ran out of all of her medications about a month ago. She is not clear about why she has not followed up with her PCP.  PCP Dr Bubba Camp  Past Medical History  Diagnosis Date  . Coronary artery disease     s/p CABG in 2005; last cath in 2008 showing occlusion of all SVG's;   . Diabetes mellitus   . Hyperlipidemia   . Hypertension   . Noncompliance   . LV dysfunction     EF 30 to 35% per echo 3/13  . Dizzy   . Hypoglycemia   . Cataract    Past Surgical History  Procedure Laterality Date  . Coronary artery bypass graft  2005    THIS INCLUDED SAPHENOUS VEIN GRAFT TO THE PDA, SAPHENOUS VEIN GRAFT TO OBTUSE MARGINAL VESSEL, AS LIMA GRAFT TO THE LAD.  Marland Kitchen Cardiac catheterization  2008    EF 50-55%. SHOWED OCCLUSION OF VEIN GRAFT  . Cholecystectomy    . Transthoracic echocardiogram  05/04/2004    EF 50-55%  . Cardiovascular stress test  12/24/2006    EF 47%   Family History  Problem Relation Age of Onset  . Tuberculosis Mother   . Lung cancer Father   . Diabetes Sister   . Heart attack Brother   . Hypertension Brother   .  Diabetes Brother    History  Substance Use Topics  . Smoking status: Never Smoker   . Smokeless tobacco: Never Used  . Alcohol Use: No   Uses a cane Son is living with her  OB History    No data available     Review of Systems  All other systems reviewed and are negative.     Allergies  Review of patient's allergies indicates no known allergies.  Home Medications   Prior to Admission medications   Medication Sig Start Date End Date Taking? Authorizing Provider  EQ ASPIRIN 325 MG EC tablet TAKE ONE TABLET BY MOUTH ONCE DAILY   Yes Mahima Pandey, MD  Insulin Glargine (LANTUS SOLOSTAR) 100 UNIT/ML Solostar Pen Inject 10 Units into the skin daily at 10 pm. 12/08/13  Yes Mahima Pandey, MD  lisinopril (PRINIVIL,ZESTRIL) 10 MG tablet Take 1 tablet (10 mg total) by mouth daily. 12/01/13 12/01/14 Yes Mahima Pandey, MD  metFORMIN (GLUCOPHAGE) 500 MG tablet Take 1 tablet (500 mg total) by mouth 2 (two) times daily with a meal. 12/01/13  Yes Mahima Pandey, MD  Multiple Vitamin (MULTIVITAMIN) tablet Take 1 tablet by mouth daily.     Yes Historical Provider, MD  omeprazole (PRILOSEC) 20 MG capsule Take 1 capsule (20 mg total) by mouth  daily. 12/01/13  Yes Blanchie Serve, MD  pravastatin (PRAVACHOL) 40 MG tablet Take 1 tablet (40 mg total) by mouth daily. 12/01/13  Yes Mahima Bubba Camp, MD  TRADJENTA 5 MG TABS tablet TAKE ONE TABLET BY MOUTH ONCE DAILY   Yes Mahima Pandey, MD  Blood Glucose Monitoring Suppl (ONE TOUCH ULTRA SYSTEM KIT) W/DEVICE KIT 1 kit by Does not apply route once. Check blood sugar once daily as directed DX 250.00 12/03/13   Mahima Pandey, MD  glucose blood (ONE TOUCH ULTRA TEST) test strip Check blood sugar once daily as directed DX 250.00 12/03/13   Blanchie Serve, MD  Insulin Pen Needle 31G X 5 MM MISC Use daily as directed with insulin. DX 250.00 12/08/13   Blanchie Serve, MD  ONETOUCH DELICA LANCETS FINE MISC Check blood sugar once daily as directed DX 250.00 12/03/13   Mahima Pandey, MD    BP 176/74 mmHg  Pulse 89  Temp(Src) 98.1 F (36.7 C)  Resp 18  SpO2 98%  Vital signs normal except for hypertension  Physical Exam  Constitutional: She is oriented to person, place, and time. She appears well-developed and well-nourished.  Non-toxic appearance. She does not appear ill. No distress.  HENT:  Head: Normocephalic and atraumatic.  Right Ear: External ear normal.  Left Ear: External ear normal.  Nose: Nose normal. No mucosal edema or rhinorrhea.  Mouth/Throat: Mucous membranes are normal. No dental abscesses or uvula swelling.  Dry tongue  Eyes: Conjunctivae and EOM are normal. Pupils are equal, round, and reactive to light.  Neck: Normal range of motion and full passive range of motion without pain. Neck supple.  Cardiovascular: Normal rate, regular rhythm and normal heart sounds.  Exam reveals no gallop and no friction rub.   No murmur heard. Pulmonary/Chest: Effort normal and breath sounds normal. No respiratory distress. She has no wheezes. She has no rhonchi. She has no rales. She exhibits no tenderness and no crepitus.  Abdominal: Soft. Normal appearance and bowel sounds are normal. She exhibits no distension. There is no tenderness. There is no rebound and no guarding.    Patient has diffuse abdominal pain with her worst pain in the left lower quadrant  Musculoskeletal: Normal range of motion. She exhibits no edema or tenderness.  Moves all extremities well.   Neurological: She is alert and oriented to person, place, and time. She has normal strength. No cranial nerve deficit.  Skin: Skin is warm, dry and intact. No rash noted. No erythema. No pallor.  Psychiatric: She has a normal mood and affect. Her speech is normal and behavior is normal. Her mood appears not anxious.  Nursing note and vitals reviewed.   ED Course  Procedures (including critical care time)  Medications  0.9 %  sodium chloride infusion (1,000 mLs Intravenous New Bag/Given 04/06/14 1203)   insulin regular (NOVOLIN R,HUMULIN R) 250 Units in sodium chloride 0.9 % 250 mL (1 Units/mL) infusion (0 Units/hr Intravenous Stopped 04/06/14 1307)  insulin aspart (novoLOG) injection 5 Units (not administered)  sodium chloride 0.9 % bolus 1,000 mL (0 mLs Intravenous Stopped 04/06/14 1204)    10:10 recheck pt states she is feeling better, her abdominal pain is gone. She was started on an insulin drip for her hyperglycemia.   14:00 Pt's glucose has improved. She has no abdominal pain or jerking. She is feeling better.   Labs Review Results for orders placed or performed during the hospital encounter of 04/06/14  Comprehensive metabolic panel  Result Value Ref Range  Sodium 137 137 - 147 mEq/L   Potassium 4.2 3.7 - 5.3 mEq/L   Chloride 100 96 - 112 mEq/L   CO2 24 19 - 32 mEq/L   Glucose, Bld 421 (H) 70 - 99 mg/dL   BUN 9 6 - 23 mg/dL   Creatinine, Ser 0.68 0.50 - 1.10 mg/dL   Calcium 9.1 8.4 - 10.5 mg/dL   Total Protein 6.6 6.0 - 8.3 g/dL   Albumin 3.0 (L) 3.5 - 5.2 g/dL   AST 6 0 - 37 U/L   ALT 7 0 - 35 U/L   Alkaline Phosphatase 124 (H) 39 - 117 U/L   Total Bilirubin 0.5 0.3 - 1.2 mg/dL   GFR calc non Af Amer 81 (L) >90 mL/min   GFR calc Af Amer >90 >90 mL/min   Anion gap 13 5 - 15  CBC with Differential  Result Value Ref Range   WBC 4.1 4.0 - 10.5 K/uL   RBC 4.41 3.87 - 5.11 MIL/uL   Hemoglobin 12.4 12.0 - 15.0 g/dL   HCT 36.9 36.0 - 46.0 %   MCV 83.7 78.0 - 100.0 fL   MCH 28.1 26.0 - 34.0 pg   MCHC 33.6 30.0 - 36.0 g/dL   RDW 12.5 11.5 - 15.5 %   Platelets PLATELET CLUMPS NOTED ON SMEAR, UNABLE TO ESTIMATE 150 - 400 K/uL   Neutrophils Relative % 47 43 - 77 %   Lymphocytes Relative 45 12 - 46 %   Monocytes Relative 5 3 - 12 %   Eosinophils Relative 2 0 - 5 %   Basophils Relative 1 0 - 1 %   Neutro Abs 2.0 1.7 - 7.7 K/uL   Lymphs Abs 1.8 0.7 - 4.0 K/uL   Monocytes Absolute 0.2 0.1 - 1.0 K/uL   Eosinophils Absolute 0.1 0.0 - 0.7 K/uL   Basophils Absolute 0.0 0.0 -  0.1 K/uL   Smear Review PLATELET CLUMPS NOTED ON SMEAR   Urinalysis, Routine w reflex microscopic  Result Value Ref Range   Color, Urine YELLOW YELLOW   APPearance CLEAR CLEAR   Specific Gravity, Urine 1.034 (H) 1.005 - 1.030   pH 6.0 5.0 - 8.0   Glucose, UA >1000 (A) NEGATIVE mg/dL   Hgb urine dipstick NEGATIVE NEGATIVE   Bilirubin Urine NEGATIVE NEGATIVE   Ketones, ur 15 (A) NEGATIVE mg/dL   Protein, ur NEGATIVE NEGATIVE mg/dL   Urobilinogen, UA 0.2 0.0 - 1.0 mg/dL   Nitrite NEGATIVE NEGATIVE   Leukocytes, UA NEGATIVE NEGATIVE  Lipase, blood  Result Value Ref Range   Lipase 59 11 - 59 U/L  Urine microscopic-add on  Result Value Ref Range   Squamous Epithelial / LPF FEW (A) RARE   WBC, UA 7-10 <3 WBC/hpf   Bacteria, UA RARE RARE   Urine-Other RARE YEAST   CBG monitoring, ED  Result Value Ref Range   Glucose-Capillary 301 (H) 70 - 99 mg/dL   Comment 1 Notify RN   CBG monitoring, ED  Result Value Ref Range   Glucose-Capillary 263 (H) 70 - 99 mg/dL   Laboratory interpretation all normal except for hyperglycemia without acidosis, malnutrition, improving hyperglycemia     Imaging Review No results found.   EKG Interpretation   Date/Time:  Tuesday April 06 2014 08:04:02 EST Ventricular Rate:  94 PR Interval:  165 QRS Duration: 100 QT Interval:  373 QTC Calculation: 466 R Axis:   -6 Text Interpretation:  Sinus rhythm Probable left ventricular hypertrophy  Inferior infarct, age  indeterminate No significant change since last  tracing 05 Oct 2011 Confirmed by [facility]  MD-I, Sharmin Foulk ([id number]) on 04/06/2014  8:19:13 AM      MDM   Final diagnoses:  Hyperglycemia  Dehydration    New Prescriptions   LINAGLIPTIN (TRADJENTA) 5 MG TABS TABLET    Take 1 tablet (5 mg total) by mouth daily.   All of patients medications were given to patient with no refills.   Modified Medications   Modified Medication Previous Medication   INSULIN GLARGINE (LANTUS SOLOSTAR) 100 UNIT/ML  SOLOSTAR PEN Insulin Glargine (LANTUS SOLOSTAR) 100 UNIT/ML Solostar Pen      Inject 10 Units into the skin daily at 10 pm.    Inject 10 Units into the skin daily at 10 pm.   INSULIN PEN NEEDLE 31G X 5 MM MISC Insulin Pen Needle 31G X 5 MM MISC      Use daily as directed with insulin. DX 250.00    Use daily as directed with insulin. DX 250.00   LISINOPRIL (PRINIVIL,ZESTRIL) 10 MG TABLET lisinopril (PRINIVIL,ZESTRIL) 10 MG tablet      Take 1 tablet (10 mg total) by mouth daily.    Take 1 tablet (10 mg total) by mouth daily.   METFORMIN (GLUCOPHAGE) 500 MG TABLET metFORMIN (GLUCOPHAGE) 500 MG tablet      Take 1 tablet (500 mg total) by mouth 2 (two) times daily with a meal.    Take 1 tablet (500 mg total) by mouth 2 (two) times daily with a meal.   OMEPRAZOLE (PRILOSEC) 20 MG CAPSULE omeprazole (PRILOSEC) 20 MG capsule      Take 1 capsule (20 mg total) by mouth daily.    Take 1 capsule (20 mg total) by mouth daily.   PRAVASTATIN (PRAVACHOL) 40 MG TABLET pravastatin (PRAVACHOL) 40 MG tablet      Take 1 tablet (40 mg total) by mouth daily.    Take 1 tablet (40 mg total) by mouth daily.     Plan discharge  Rolland Porter, MD, Alanson Aly, MD 04/06/14 [phone]

## 2014-04-06 NOTE — ED Notes (Signed)
Notified RN of CBG 301

## 2014-04-06 NOTE — ED Notes (Signed)
[facility]- pt has had two spells of tremors. No LOC during these spells. Pt CBG: 418 with EMS, pt is non-compliant with meds and has not had them in about a week. No shaking spells or tremors with EMS, pt A&O X4.

## 2014-04-06 NOTE — Discharge Instructions (Signed)
Restart your medications. Call and get an appointment to see Dr Sander RadonPandy soon. Return to the ED if you feel worse again.

## 2014-04-07 ENCOUNTER — Emergency Department (HOSPITAL_COMMUNITY): Payer: Medicare Other

## 2014-04-07 ENCOUNTER — Encounter (HOSPITAL_COMMUNITY): Payer: Self-pay | Admitting: Emergency Medicine

## 2014-04-07 ENCOUNTER — Emergency Department (HOSPITAL_COMMUNITY)
Admission: EM | Admit: 2014-04-07 | Discharge: 2014-04-07 | Disposition: A | Discharge status: Home / Self Care | Payer: Medicare Other | Attending: Emergency Medicine | Admitting: Emergency Medicine

## 2014-04-07 DIAGNOSIS — R739 Hyperglycemia, unspecified: Secondary | ICD-10-CM

## 2014-04-07 DIAGNOSIS — I10 Essential (primary) hypertension: Secondary | ICD-10-CM | POA: Diagnosis not present

## 2014-04-07 DIAGNOSIS — Z794 Long term (current) use of insulin: Secondary | ICD-10-CM | POA: Diagnosis not present

## 2014-04-07 DIAGNOSIS — E1165 Type 2 diabetes mellitus with hyperglycemia: Secondary | ICD-10-CM | POA: Diagnosis not present

## 2014-04-07 DIAGNOSIS — I25810 Atherosclerosis of coronary artery bypass graft(s) without angina pectoris: Secondary | ICD-10-CM | POA: Insufficient documentation

## 2014-04-07 DIAGNOSIS — I509 Heart failure, unspecified: Secondary | ICD-10-CM | POA: Insufficient documentation

## 2014-04-07 DIAGNOSIS — Z9119 Patient's noncompliance with other medical treatment and regimen: Secondary | ICD-10-CM | POA: Insufficient documentation

## 2014-04-07 DIAGNOSIS — H269 Unspecified cataract: Secondary | ICD-10-CM | POA: Insufficient documentation

## 2014-04-07 DIAGNOSIS — Z9114 Patient's other noncompliance with medication regimen: Secondary | ICD-10-CM

## 2014-04-07 DIAGNOSIS — R935 Abnormal findings on diagnostic imaging of other abdominal regions, including retroperitoneum: Secondary | ICD-10-CM | POA: Insufficient documentation

## 2014-04-07 DIAGNOSIS — R109 Unspecified abdominal pain: Secondary | ICD-10-CM | POA: Diagnosis present

## 2014-04-07 DIAGNOSIS — R52 Pain, unspecified: Secondary | ICD-10-CM

## 2014-04-07 DIAGNOSIS — Z9889 Other specified postprocedural states: Secondary | ICD-10-CM | POA: Diagnosis not present

## 2014-04-07 DIAGNOSIS — Z79899 Other long term (current) drug therapy: Secondary | ICD-10-CM | POA: Diagnosis not present

## 2014-04-07 DIAGNOSIS — Z951 Presence of aortocoronary bypass graft: Secondary | ICD-10-CM | POA: Insufficient documentation

## 2014-04-07 DIAGNOSIS — E785 Hyperlipidemia, unspecified: Secondary | ICD-10-CM | POA: Insufficient documentation

## 2014-04-07 DIAGNOSIS — R9389 Abnormal findings on diagnostic imaging of other specified body structures: Secondary | ICD-10-CM

## 2014-04-07 HISTORY — DX: Heart failure, unspecified: I50.9

## 2014-04-07 HISTORY — DX: Patient's other noncompliance with medication regimen for other reason: Z91.148

## 2014-04-07 HISTORY — DX: Ischemic cardiomyopathy: I25.5

## 2014-04-07 HISTORY — DX: Other amnesia: R41.3

## 2014-04-07 HISTORY — DX: Patient's other noncompliance with medication regimen: Z91.14

## 2014-04-07 LAB — CBC WITH DIFFERENTIAL/PLATELET
BASOS PCT: 0 % (ref 0–1)
Basophils Absolute: 0 10*3/uL (ref 0.0–0.1)
EOS ABS: 0 10*3/uL (ref 0.0–0.7)
Eosinophils Relative: 1 % (ref 0–5)
HEMATOCRIT: 38.1 % (ref 36.0–46.0)
Hemoglobin: 12.4 g/dL (ref 12.0–15.0)
Lymphocytes Relative: 36 % (ref 12–46)
Lymphs Abs: 1.8 10*3/uL (ref 0.7–4.0)
MCH: 27.6 pg (ref 26.0–34.0)
MCHC: 32.5 g/dL (ref 30.0–36.0)
MCV: 84.9 fL (ref 78.0–100.0)
MONO ABS: 0.2 10*3/uL (ref 0.1–1.0)
Monocytes Relative: 4 % (ref 3–12)
Neutro Abs: 2.9 10*3/uL (ref 1.7–7.7)
Neutrophils Relative %: 59 % (ref 43–77)
Platelets: 250 10*3/uL (ref 150–400)
RBC: 4.49 MIL/uL (ref 3.87–5.11)
RDW: 12.8 % (ref 11.5–15.5)
WBC: 5 10*3/uL (ref 4.0–10.5)

## 2014-04-07 LAB — URINALYSIS, ROUTINE W REFLEX MICROSCOPIC
Bilirubin Urine: NEGATIVE
Glucose, UA: >1000 mg/dL — AB
Hgb urine dipstick: NEGATIVE
Ketones, ur: 15 mg/dL — AB
LEUKOCYTES UA: NEGATIVE
Nitrite: NEGATIVE
PH: 5.5 (ref 5.0–8.0)
PROTEIN: NEGATIVE mg/dL
Specific Gravity, Urine: 1.034 — ABNORMAL HIGH (ref 1.005–1.030)
Urobilinogen, UA: 0.2 mg/dL (ref 0.0–1.0)

## 2014-04-07 LAB — COMPREHENSIVE METABOLIC PANEL
ALK PHOS: 108 U/L (ref 39–117)
ALT: 10 U/L (ref 0–35)
AST: 22 U/L (ref 0–37)
Albumin: 3.2 g/dL — ABNORMAL LOW (ref 3.5–5.2)
Anion gap: 14 (ref 5–15)
BUN: 6 mg/dL (ref 6–23)
CO2: 23 meq/L (ref 19–32)
Calcium: 9.4 mg/dL (ref 8.4–10.5)
Chloride: 99 mEq/L (ref 96–112)
Creatinine, Ser: 0.59 mg/dL (ref 0.50–1.10)
GFR calc non Af Amer: 85 mL/min — ABNORMAL LOW (ref >90)
Glucose, Bld: 346 mg/dL — ABNORMAL HIGH (ref 70–99)
Potassium: 5.3 mEq/L (ref 3.7–5.3)
SODIUM: 136 meq/L — AB (ref 137–147)
Total Bilirubin: 0.5 mg/dL (ref 0.3–1.2)
Total Protein: 6.9 g/dL (ref 6.0–8.3)

## 2014-04-07 LAB — I-STAT ARTERIAL BLOOD GAS, ED
ACID-BASE DEFICIT: 1 mmol/L (ref 0.0–2.0)
BICARBONATE: 23.7 meq/L (ref 20.0–24.0)
O2 Saturation: 96 %
TCO2: 25 mmol/L (ref 0–100)
pCO2 arterial: 39.6 mmHg (ref 35.0–45.0)
pH, Arterial: 7.385 (ref 7.350–7.450)
pO2, Arterial: 86 mmHg (ref 80.0–100.0)

## 2014-04-07 LAB — URINE MICROSCOPIC-ADD ON

## 2014-04-07 LAB — TROPONIN I: Troponin I: <0.3 ng/mL (ref <0.30)

## 2014-04-07 LAB — LIPASE, BLOOD: Lipase: 35 U/L (ref 11–59)

## 2014-04-07 MED ORDER — IOHEXOL 300 MG/ML  SOLN
25.0000 mL | Freq: Once | INTRAMUSCULAR | Status: AC | PRN
  Administered 2014-04-07: 25 mL via ORAL

## 2014-04-07 MED ORDER — SODIUM CHLORIDE 0.9 % IV SOLN
INTRAVENOUS | Status: DC
  Administered 2014-04-07: 13:00:00 via INTRAVENOUS

## 2014-04-07 MED ORDER — IOHEXOL 300 MG/ML  SOLN
125.0000 mL | Freq: Once | INTRAMUSCULAR | Status: AC | PRN
  Administered 2014-04-07: 125 mL via INTRAVENOUS

## 2014-04-07 NOTE — ED Notes (Signed)
Two 20[facility], slow to flush and positional. CT sts will not withstand machine flush. This RN and Dr. Clarene DukeMcmanus placed 20G US guided IV in right AC, blood back and able to flush well. CT notified.

## 2014-04-07 NOTE — Clinical Social Work Note (Signed)
Clinical Social Worker received notification from MD that patient is frequently coming into the ED and noncompliant with medications.  CSW spoke with CM who states that patient may be eligible for home health and/or [facility] - CM to further explore and make arrangements.  CSW available if needed.  Macario GoldsJesse Seger Jani, [place] [phone]

## 2014-04-07 NOTE — ED Notes (Signed)
IV team at bedside 

## 2014-04-07 NOTE — ED Notes (Signed)
Pt returns to ED today for ABD cramping . Pt was seen on 04-07-11 for same problem . Pt did not get any Rx's filled . Pt unsure why Rx's were not filled. CGB 324 per EMS and B/P 220/120.

## 2014-04-07 NOTE — ED Notes (Signed)
Resident at bedside to attempt US IV

## 2014-04-07 NOTE — ED Notes (Signed)
Pt still at CT.

## 2014-04-07 NOTE — ED Notes (Signed)
RN flushed IV placed by resident x3, pt denies stinging, no induration or infiltration noted. IV is positional, gauze placed underneath- unable to float catheter completely in. Pt informed to keep arm straight and second RN flushed without issue. CT made aware.

## 2014-04-07 NOTE — ED Provider Notes (Signed)
Discussed results with the patient, she is aware of person CT scan findings, family member also aware, they're going to pick up her prescriptions at this time. She appears well  Mckenzie RollerBrian D Shanterica Biehler, MD 04/07/14 [phone]

## 2014-04-07 NOTE — Discharge Planning (Signed)
Spoke with pt, son and brother at bedside regarding discharge planning.  [facility]  [facility] and [facility] will be glad to rein[facility] her if the need arises.  HH services needed include RN, NA, SW.  Thank you!

## 2014-04-07 NOTE — ED Notes (Signed)
MD at bedside. 

## 2014-04-07 NOTE — ED Provider Notes (Signed)
CSN: [id number]     Arrival date & time 04/07/14  0944 History   First MD Initiated Contact with Patient 04/07/14 0945     Chief Complaint  Patient presents with  . Abdominal Cramping     HPI Pt was seen at 0955.  Per pt and her son, c/o gradual onset and persistence of constant generalized abd "pain" for the past several days. Describes the abd pain as "cramping."  Denies N/V, denies diarrhea. Denies fevers, no back pain, no rash, no CP/SOB, no black or blood in stools. Pt was evaluated in the ED yesterday for these symptoms, and was noted to have an elevated CBG. ED notes state pt endorsed she had not taken her medications in about a month. Refills of multiple medications were given to pt yesterday. Pt and her son state she "didn't get them filled." Pt has not called her PMD for a f/u appointment ("I don't know why.") Pt denies any change in her symptoms since yesterday. Denies any new symptoms.     Past Medical History  Diagnosis Date  . Coronary artery disease     s/p CABG in 2005; last cath in 2008 showing occlusion of all SVG's;   . Diabetes mellitus   . Hyperlipidemia   . Hypertension   . LV dysfunction     EF 30 to 35% per echo 3/13  . Dizzy   . Hypoglycemia   . Cataract   . CHF (congestive heart failure)   . Non compliance w medication regimen   . Ischemic cardiomyopathy   . Memory difficulty    Past Surgical History  Procedure Laterality Date  . Coronary artery bypass graft  2005    THIS INCLUDED SAPHENOUS VEIN GRAFT TO THE PDA, SAPHENOUS VEIN GRAFT TO OBTUSE MARGINAL VESSEL, AS LIMA GRAFT TO THE LAD.  Marland Kitchen Cardiac catheterization  2008    EF 50-55%. SHOWED OCCLUSION OF VEIN GRAFT  . Cholecystectomy    . Transthoracic echocardiogram  05/04/2004    EF 50-55%  . Cardiovascular stress test  12/24/2006    EF 47%   Family History  Problem Relation Age of Onset  . Tuberculosis Mother   . Lung cancer Father   . Diabetes Sister   . Heart attack Brother   . Hypertension  Brother   . Diabetes Brother    History  Substance Use Topics  . Smoking status: Never Smoker   . Smokeless tobacco: Never Used  . Alcohol Use: No    Review of Systems ROS: Statement: All systems negative except as marked or noted in the HPI; Constitutional: Negative for fever and chills. ; ; Eyes: Negative for eye pain, redness and discharge. ; ; ENMT: Negative for ear pain, hoarseness, nasal congestion, sinus pressure and sore throat. ; ; Cardiovascular: Negative for chest pain, palpitations, diaphoresis, dyspnea and peripheral edema. ; ; Respiratory: Negative for cough, wheezing and stridor. ; ; Gastrointestinal: +abd pain. Negative for nausea, vomiting, diarrhea, blood in stool, hematemesis, jaundice and rectal bleeding. . ; ; Genitourinary: Negative for dysuria, flank pain and hematuria. ; ; Musculoskeletal: Negative for back pain and neck pain. Negative for swelling and trauma.; ; Skin: Negative for pruritus, rash, abrasions, blisters, bruising and skin lesion.; ; Neuro: Negative for headache, lightheadedness and neck stiffness. Negative for weakness, altered level of consciousness , altered mental status, extremity weakness, paresthesias, involuntary movement, seizure and syncope.      Allergies  Review of patient's allergies indicates no known allergies.  Home Medications  Prior to Admission medications   Medication Sig Start Date End Date Taking? Authorizing Provider  EQ ASPIRIN 325 MG EC tablet TAKE ONE TABLET BY MOUTH ONCE DAILY   Yes Mahima Pandey, MD  Insulin Glargine (LANTUS SOLOSTAR) 100 UNIT/ML Solostar Pen Inject 10 Units into the skin daily at 10 pm. 04/06/14  Yes Janice Norrie, MD  linagliptin (TRADJENTA) 5 MG TABS tablet Take 1 tablet (5 mg total) by mouth daily. 04/06/14  Yes Janice Norrie, MD  lisinopril (PRINIVIL,ZESTRIL) 10 MG tablet Take 1 tablet (10 mg total) by mouth daily. 04/06/14 04/06/15 Yes Janice Norrie, MD  metFORMIN (GLUCOPHAGE) 500 MG tablet Take 1 tablet (500  mg total) by mouth 2 (two) times daily with a meal. 04/06/14  Yes Janice Norrie, MD  Multiple Vitamin (MULTIVITAMIN) tablet Take 1 tablet by mouth daily.     Yes Historical Provider, MD  omeprazole (PRILOSEC) 20 MG capsule Take 1 capsule (20 mg total) by mouth daily. 04/06/14  Yes Janice Norrie, MD  pravastatin (PRAVACHOL) 40 MG tablet Take 1 tablet (40 mg total) by mouth daily. 04/06/14  Yes Janice Norrie, MD  TRADJENTA 5 MG TABS tablet TAKE ONE TABLET BY MOUTH ONCE DAILY   Yes Mahima Pandey, MD  Blood Glucose Monitoring Suppl (ONE TOUCH ULTRA SYSTEM KIT) W/DEVICE KIT 1 kit by Does not apply route once. Check blood sugar once daily as directed DX 250.00 12/03/13   Mahima Pandey, MD  glucose blood (ONE TOUCH ULTRA TEST) test strip Check blood sugar once daily as directed DX 250.00 12/03/13   Blanchie Serve, MD  Insulin Pen Needle 31G X 5 MM MISC Use daily as directed with insulin. DX 250.00 04/06/14   Janice Norrie, MD  [facility] DELICA LANCETS FINE MISC Check blood sugar once daily as directed DX 250.00 12/03/13   Mahima Pandey, MD   BP 167/67 mmHg  Pulse 83  Resp 16  Ht '5\' 2"'  (1.575 m)  Wt 147 lb (66.679 kg)  BMI 26.88 kg/m2  SpO2 100%   12:11:41 Orthostatic Vital Signs EP  Orthostatic Lying  - BP- Lying: 169/70 mmHg ; Pulse- Lying: 85  Orthostatic Sitting - BP- Sitting: 175/97 mmHg ; Pulse- Sitting: 89  Orthostatic Standing at 0 minutes - BP- Standing at 0 minutes: 177/83 mmHg ; Pulse- Standing at 0 minutes: 94     Physical Exam  1000: Physical examination:  Nursing notes reviewed; Vital signs and O2 SAT reviewed;  Constitutional: Well developed, Well nourished, In no acute distress; Head:  Normocephalic, atraumatic; Eyes: EOMI, PERRL, No scleral icterus; ENMT: Mouth and pharynx normal, Mucous membranes dry; Neck: Supple, Full range of motion, No lymphadenopathy; Cardiovascular: Regular rate and rhythm, No gallop; Respiratory: Breath sounds clear & equal bilaterally, No wheezes.  Speaking full  sentences with ease, Normal respiratory effort/excursion; Chest: Nontender, Movement normal; Abdomen: Soft, +mild diffuse tenderness to palp. No rebound or guarding. Nondistended, Normal bowel sounds; Genitourinary: No CVA tenderness; Extremities: Pulses normal, No tenderness, No edema, No calf edema or asymmetry.; Neuro: Awake, alert, confused re: events. Vague, rambling historian. Major CN grossly intact.  Speech clear. No gross focal motor or sensory deficits in extremities.; Skin: Color normal, Warm, Dry.   ED Course  Procedures   Angiocath insertion Performed by: Alfonzo Feller Consent: Verbal consent obtained. Risks and benefits: risks, benefits and alternatives were discussed Time out: Immediately prior to procedure a "time out" was called to verify the correct patient, procedure, equipment, support staff and site/side  marked as required. Preparation: Patient was prepped and draped in the usual sterile fashion.  Vein Location: right medial AC Ultrasound Guided Gauge: 20 Normal blood return and flush without difficulty Patient tolerance: Patient tolerated the procedure well with no immediate complications.      1010:  Pt seen in the ED yesterday for same and received several prescriptions. Pt and her son initially stated that "the pharmacy didn't have any insulin" and "the pharmacy wasn't going to have any of the medicines for another week."  After direct questioning by myself and ED RN, pt and her son did finally admit that they did not even go to the pharmacy to try and fill her prescriptions. Both "didn't know why" and "we just didn't."  EPIC chart reviewed: pt has significant hx of recurrent medication non-compliance and "memory issues" with PMD questioning dx of dementia and "need for formal testing." Concerned that pt and her son both "forgot" she has a PMD to see for refills of her medications and to f/u with after ED visits.  T/C to ED Social Worker, case discussed, including:   HPI, pertinent PM/SHx, VS/PE, dx testing, ED course and treatment:  Agreeable to come to ED to talk with pt and her son.   1730:  CM and SW have both evaluated pt in the ED. Udip without obvious infection. CBG elevated; but pt is not acidotic. CT hematuria protocol pending to further clarify previous CT findings. Sign out to Dr. Sabra Heck.       MDM  MDM Reviewed: previous chart, nursing note and vitals Reviewed previous: labs and ECG Interpretation: labs, ECG and CT scan      Date: 04/07/2014  Rate: 83  Rhythm: normal sinus rhythm  QRS Axis: normal  Intervals: normal  ST/T Wave abnormalities: normal. artifact  Conduction Disutrbances:none  Narrative Interpretation:   Old EKG Reviewed: unchanged; no significant changes from previous EKG dated 04/06/2014.   Results for orders placed or performed during the hospital encounter of 04/07/14  Comprehensive metabolic panel  Result Value Ref Range   Sodium 136 (L) 137 - 147 mEq/L   Potassium 5.3 3.7 - 5.3 mEq/L   Chloride 99 96 - 112 mEq/L   CO2 23 19 - 32 mEq/L   Glucose, Bld 346 (H) 70 - 99 mg/dL   BUN 6 6 - 23 mg/dL   Creatinine, Ser 0.59 0.50 - 1.10 mg/dL   Calcium 9.4 8.4 - 10.5 mg/dL   Total Protein 6.9 6.0 - 8.3 g/dL   Albumin 3.2 (L) 3.5 - 5.2 g/dL   AST 22 0 - 37 U/L   ALT 10 0 - 35 U/L   Alkaline Phosphatase 108 39 - 117 U/L   Total Bilirubin 0.5 0.3 - 1.2 mg/dL   GFR calc non Af Amer 85 (L) >90 mL/min   GFR calc Af Amer >90 >90 mL/min   Anion gap 14 5 - 15  Lipase, blood  Result Value Ref Range   Lipase 35 11 - 59 U/L  Troponin I  Result Value Ref Range   Troponin I <0.30 <0.30 ng/mL  CBC with Differential  Result Value Ref Range   WBC 5.0 4.0 - 10.5 K/uL   RBC 4.49 3.87 - 5.11 MIL/uL   Hemoglobin 12.4 12.0 - 15.0 g/dL   HCT 38.1 36.0 - 46.0 %   MCV 84.9 78.0 - 100.0 fL   MCH 27.6 26.0 - 34.0 pg   MCHC 32.5 30.0 - 36.0 g/dL   RDW 12.8 11.5 -  15.5 %   Platelets 250 150 - 400 K/uL   Neutrophils Relative  % 59 43 - 77 %   Neutro Abs 2.9 1.7 - 7.7 K/uL   Lymphocytes Relative 36 12 - 46 %   Lymphs Abs 1.8 0.7 - 4.0 K/uL   Monocytes Relative 4 3 - 12 %   Monocytes Absolute 0.2 0.1 - 1.0 K/uL   Eosinophils Relative 1 0 - 5 %   Eosinophils Absolute 0.0 0.0 - 0.7 K/uL   Basophils Relative 0 0 - 1 %   Basophils Absolute 0.0 0.0 - 0.1 K/uL  Urinalysis, Routine w reflex microscopic  Result Value Ref Range   Color, Urine YELLOW YELLOW   APPearance CLEAR CLEAR   Specific Gravity, Urine 1.034 (H) 1.005 - 1.030   pH 5.5 5.0 - 8.0   Glucose, UA >1000 (A) NEGATIVE mg/dL   Hgb urine dipstick NEGATIVE NEGATIVE   Bilirubin Urine NEGATIVE NEGATIVE   Ketones, ur 15 (A) NEGATIVE mg/dL   Protein, ur NEGATIVE NEGATIVE mg/dL   Urobilinogen, UA 0.2 0.0 - 1.0 mg/dL   Nitrite NEGATIVE NEGATIVE   Leukocytes, UA NEGATIVE NEGATIVE  Urine microscopic-add on  Result Value Ref Range   Squamous Epithelial / LPF RARE RARE   WBC, UA 0-2 <3 WBC/hpf  I-Stat arterial blood gas, ED  Result Value Ref Range   pH, Arterial 7.385 7.350 - 7.450   pCO2 arterial 39.6 35.0 - 45.0 mmHg   pO2, Arterial 86.0 80.0 - 100.0 mmHg   Bicarbonate 23.7 20.0 - 24.0 mEq/L   TCO2 25 0 - 100 mmol/L   O2 Saturation 96.0 %   Acid-base deficit 1.0 0.0 - 2.0 mmol/L   Patient temperature 99.1 F    Collection site RADIAL, ALLEN'S TEST ACCEPTABLE    Drawn by RT    Sample type ARTERIAL    Ct Abdomen Pelvis Wo Contrast 04/07/2014   CLINICAL DATA:  Abdominal cramping.  EXAM: CT ABDOMEN AND PELVIS WITHOUT CONTRAST  TECHNIQUE: Multidetector CT imaging of the abdomen and pelvis was performed following the standard protocol without IV contrast.  COMPARISON:  None.  FINDINGS: Lower chest: No pleural effusion identified. The lung bases appear clear.  Hepatobiliary: There is no focal liver abnormality identified. Previous cholecystectomy. No biliary dilatation.  Pancreas: The pancreas appears normal.  Spleen: Normal.  Adrenals/Urinary Tract: Normal  appearance of both adrenal glands. Right pelvocaliectasis noted. There is increased attenuation within the prominent right renal pelvis with Hounsfield units equal to 30.8. Multiple parapelvic cysts are identified within the left kidney. Increased attenuation within the bladder also noted measuring up to 25 Hounsfield units.  Stomach/Bowel: There is a small hiatal hernia. The small bowel loops have a normal course and caliber. No obstruction. The appendix is visualized and appears normal. Multiple distal colonic diverticula identified without acute inflammation.  Vascular/Lymphatic: Calcified atherosclerotic disease involves the abdominal aorta. No aneurysm. No retroperitoneal or mesenteric adenopathy identified. No pelvic or inguinal adenopathy identified.  Reproductive: The uterus and the adnexal structures appear normal for patient's age.  Other: No free fluid or fluid collections within the abdomen or pelvis.  Musculoskeletal: Multi level degenerative disc disease noted throughout the lumbar spine.  IMPRESSION: 1. There is right-sided pelvocaliectasis. Increased attenuation within the dilated right renal collecting system an urinary bladder is noted. In the absence of known recent IV contrast administration this finding may reflect blood products or pyuria. If there is evidence of hematuria without infection then consider further evaluation with hematuria protocol CT  to lobe for underlying urothelial lesion. 2. Atherosclerotic disease.   Electronically Signed   By: Kerby Moors M.D.   On: 04/07/2014 13:37      Francine Graven, DO 04/10/14 7561

## 2014-04-07 NOTE — Discharge Instructions (Signed)
Please call your doctor for a followup appointment within 24-48 hours. When you talk to your doctor please let them know that you were seen in the emergency department and have them acquire all of your records so that they can discuss the findings with you and formulate a treatment plan to fully care for your new and ongoing problems. ° °

## 2014-04-08 LAB — URINE CULTURE: Colony Count: >=100000

## 2014-04-10 DIAGNOSIS — E119 Type 2 diabetes mellitus without complications: Secondary | ICD-10-CM

## 2014-04-10 DIAGNOSIS — M6281 Muscle weakness (generalized): Secondary | ICD-10-CM

## 2014-04-10 DIAGNOSIS — I10 Essential (primary) hypertension: Secondary | ICD-10-CM

## 2014-04-10 DIAGNOSIS — I2510 Atherosclerotic heart disease of native coronary artery without angina pectoris: Secondary | ICD-10-CM

## 2014-09-27 ENCOUNTER — Telehealth: Payer: Self-pay | Admitting: *Deleted

## 2014-09-27 NOTE — Telephone Encounter (Signed)
FYI---Patient daughter, Cherylann BanasLori Judd called and had concerns regarding patient's environment. Stated that patient is not on a schedule and "all over the place" stated that it was not a stable environment. [facility] [place] [facility] [place] her but needs home health. Told her that patient would have to have a primary there that patient could establish with due to orders that would have to be given to homehealth. Instructed her to call patient's insurance company to see about establishing with a Dr. There that is covered. She agreed. Patient has an appointment tomorrow with Abbey ChattersJessica Eubanks.

## 2014-09-28 ENCOUNTER — Ambulatory Visit: Payer: Medicare Other | Admitting: Nurse Practitioner

## 2014-10-05 ENCOUNTER — Ambulatory Visit (INDEPENDENT_AMBULATORY_CARE_PROVIDER_SITE_OTHER): Payer: Medicare Other | Admitting: Nurse Practitioner

## 2014-10-05 ENCOUNTER — Encounter: Payer: Self-pay | Admitting: Nurse Practitioner

## 2014-10-05 VITALS — BP 132/80 | HR 109 | Temp 98.1°F | Resp 18

## 2014-10-05 DIAGNOSIS — R5381 Other malaise: Secondary | ICD-10-CM | POA: Diagnosis not present

## 2014-10-05 DIAGNOSIS — I25119 Atherosclerotic heart disease of native coronary artery with unspecified angina pectoris: Secondary | ICD-10-CM

## 2014-10-05 DIAGNOSIS — I10 Essential (primary) hypertension: Secondary | ICD-10-CM

## 2014-10-05 DIAGNOSIS — R000 Tachycardia, unspecified: Secondary | ICD-10-CM

## 2014-10-05 DIAGNOSIS — E785 Hyperlipidemia, unspecified: Secondary | ICD-10-CM

## 2014-10-05 DIAGNOSIS — E119 Type 2 diabetes mellitus without complications: Secondary | ICD-10-CM | POA: Diagnosis not present

## 2014-10-05 LAB — GLUCOSE, POCT (MANUAL RESULT ENTRY): POC Glucose: 322 mg/dl — AB (ref 70–99)

## 2014-10-05 MED ORDER — METFORMIN HCL 500 MG PO TABS: 500.0000 mg | ORAL_TABLET | Freq: Every day | ORAL | Status: DC

## 2014-10-05 MED ORDER — ASPIRIN 325 MG PO TBEC: 325.0000 mg | DELAYED_RELEASE_TABLET | Freq: Every day | ORAL | Status: DC

## 2014-10-05 MED ORDER — LISINOPRIL 10 MG PO TABS: 10.0000 mg | ORAL_TABLET | Freq: Every day | ORAL | Status: DC

## 2014-10-05 NOTE — Progress Notes (Signed)
Patient ID: Mckenzie Williamson, female   DOB: Dec 20, 1932, 81 y.o.   MRN: [id number]    PCP: Lauree Chandler, NP  No Known Allergies  Chief Complaint  Patient presents with  . Acute Visit    Concerned about low BS levels (BS dropped to 21 and 911 called 1.5 weeks ago), bilateral leg swelling x 5 weeks.  Patient with decreased mobility.  . Referral    Home health referral to help with decreased mobility, bathing, medications, and meal preps.      HPI: Patient is a 81 y.o. female seen in the office today due to concerns over low blood sugars. Pt lives with her sons, daughter is POA but lives out of town. Family friend is here today, pts neighbor who helps out and keeps daughter who is POA informed. Pt has not been here in 10 months.  daughter would like more assistance around the home, concerns over medication management. Blood sugar dropped low and metformin was decreased.   pts sons did NOT bring blood sugar log  Called EMS 2 weeks ago, due to pt being incoherent, sweating, fallen out of the bed-- EMS took her blood sugar and it was 21. They give her medication to come up.  Only given Metformin once daily  lantus 10 units daily  Does not eat much.  Can hardly walk-- 6 months ago she was walking Eating a lot of sandwiches.  Has had frequent falls in the last month- 3 not counting the one with hypoglycemia  Review of Systems:  Review of Systems  Constitutional: Positive for activity change. Negative for appetite change, fatigue and unexpected weight change.  Respiratory: Positive for shortness of breath (with any activity).   Cardiovascular: Positive for palpitations (when she gets excited) and leg swelling. Negative for chest pain.  Gastrointestinal: Positive for constipation. Negative for diarrhea.  Genitourinary: Negative for difficulty urinating.       Incontinence of urine   Musculoskeletal: Positive for arthralgias.  Neurological: Positive for weakness.    Psychiatric/Behavioral: Positive for confusion. The patient is nervous/anxious.     Past Medical History  Diagnosis Date  . Coronary artery disease     s/p CABG in 2005; last cath in 2008 showing occlusion of all SVG's;   . Diabetes mellitus   . Hyperlipidemia   . Hypertension   . LV dysfunction     EF 30 to 35% per echo 3/13  . Dizzy   . Hypoglycemia   . Cataract   . CHF (congestive heart failure)   . Non compliance w medication regimen   . Ischemic cardiomyopathy   . Memory difficulty    Past Surgical History  Procedure Laterality Date  . Coronary artery bypass graft  2005    THIS INCLUDED SAPHENOUS VEIN GRAFT TO THE PDA, SAPHENOUS VEIN GRAFT TO OBTUSE MARGINAL VESSEL, AS LIMA GRAFT TO THE LAD.  Marland Kitchen Cardiac catheterization  2008    EF 50-55%. SHOWED OCCLUSION OF VEIN GRAFT  . Cholecystectomy    . Transthoracic echocardiogram  05/04/2004    EF 50-55%  . Cardiovascular stress test  12/24/2006    EF 47%   Social History:   reports that she has never smoked. She has never used smokeless tobacco. She reports that she does not drink alcohol or use illicit drugs.  Family History  Problem Relation Age of Onset  . Tuberculosis Mother   . Lung cancer Father   . Diabetes Sister   . Heart attack Brother   .  Hypertension Brother   . Diabetes Brother     Medications: Patient's Medications  New Prescriptions   No medications on file  Previous Medications   BLOOD GLUCOSE MONITORING SUPPL (ONE TOUCH ULTRA SYSTEM KIT) W/DEVICE KIT    1 kit by Does not apply route once. Check blood sugar once daily as directed DX 250.00   EQ ASPIRIN 325 MG EC TABLET    TAKE ONE TABLET BY MOUTH ONCE DAILY   GLUCOSE BLOOD (ONE TOUCH ULTRA TEST) TEST STRIP    Check blood sugar once daily as directed DX 250.00   INSULIN GLARGINE (LANTUS SOLOSTAR) 100 UNIT/ML SOLOSTAR PEN    Inject 10 Units into the skin daily at 10 pm.   INSULIN PEN NEEDLE 31G X 5 MM MISC    Use daily as directed with insulin. DX  250.00   MULTIPLE VITAMIN (MULTIVITAMIN) TABLET    Take 1 tablet by mouth daily.     OMEPRAZOLE (PRILOSEC) 20 MG CAPSULE    Take 1 capsule (20 mg total) by mouth daily.   ONETOUCH DELICA LANCETS FINE MISC    Check blood sugar once daily as directed DX 250.00   PRAVASTATIN (PRAVACHOL) 40 MG TABLET    Take 1 tablet (40 mg total) by mouth daily.  Modified Medications   Modified Medication Previous Medication   METFORMIN (GLUCOPHAGE) 500 MG TABLET metFORMIN (GLUCOPHAGE) 500 MG tablet      Take 1 tablet (500 mg total) by mouth 2 (two) times daily with a meal.    Take 1 tablet (500 mg total) by mouth 2 (two) times daily with a meal.  Discontinued Medications   LINAGLIPTIN (TRADJENTA) 5 MG TABS TABLET    Take 1 tablet (5 mg total) by mouth daily.   LISINOPRIL (PRINIVIL,ZESTRIL) 10 MG TABLET    Take 1 tablet (10 mg total) by mouth daily.   TRADJENTA 5 MG TABS TABLET    TAKE ONE TABLET BY MOUTH ONCE DAILY     Physical Exam:  Filed Vitals:   10/05/14 1025  BP: 132/80  Pulse: 109  Temp: 98.1 F (36.7 C)  TempSrc: Oral  Resp: 18  SpO2: 95%    Physical Exam  Constitutional: She appears well-developed and well-nourished.  HENT:  Mouth/Throat: Oropharynx is clear and moist. No oropharyngeal exudate.  Eyes: Conjunctivae and EOM are normal. Pupils are equal, round, and reactive to light.  Neck: Normal range of motion. Neck supple.  Cardiovascular: Regular rhythm and normal heart sounds.  Tachycardia present.   Pulmonary/Chest: Effort normal and breath sounds normal.  Abdominal: Soft. Bowel sounds are normal.  Musculoskeletal: She exhibits edema (2+ bilaterally).  Neurological: She is alert.  Skin: Skin is warm and dry.  Psychiatric: Cognition and memory are impaired.    Labs reviewed: Basic Metabolic Panel:  Recent Labs  12/01/13 1700 04/06/14 0830 04/07/14 1145  NA 134 137 136*  K 4.7 4.2 5.3  CL 96* 100 99  CO2 '22 24 23  ' GLUCOSE 466* 421* 346*  BUN '14 9 6  ' CREATININE 0.90  0.68 0.59  CALCIUM 9.6 9.1 9.4  TSH 0.928  --   --    Liver Function Tests:  Recent Labs  12/01/13 1700 04/06/14 0830 04/07/14 1145  AST '9 6 22  ' ALT '10 7 10  ' ALKPHOS 130* 124* 108  BILITOT <0.2 0.5 0.5  PROT 6.5 6.6 6.9  ALBUMIN  --  3.0* 3.2*    Recent Labs  04/06/14 0830 04/07/14 1145  LIPASE 59 35  No results for input(s): AMMONIA in the last 8760 hours. CBC:  Recent Labs  12/01/13 1700 04/06/14 0830 04/07/14 1348  WBC 6.8 4.1 5.0  NEUTROABS 3.7 2.0 2.9  HGB 12.0 12.4 12.4  HCT 36.6 36.9 38.1  MCV 86 83.7 84.9  PLT  --  PLATELET CLUMPS NOTED ON SMEAR, UNABLE TO ESTIMATE 250   Lipid Panel:  Recent Labs  12/01/13 1700  CHOL 294*  HDL 39*  LDLCALC 184*  TRIG 355*  CHOLHDL 7.5*   TSH:  Recent Labs  12/01/13 1700  TSH 0.928   A1C: Lab Results  Component Value Date   HGBA1C 15.7* 12/01/2013     Assessment/Plan 1. Type 2 diabetes mellitus not at goal -blood sugar in office is over 300 -did not bring log, so instructed to take blood sugars twice daily and record, bring to next visit -cont medication, lantus and metformin -to start a food log and log all by mouth intake - Ambulatory referral to [place] - Hemoglobin A1c - Comprehensive metabolic panel - CBC with Differential/Platelet - metFORMIN (GLUCOPHAGE) 500 MG tablet; Take 1 tablet (500 mg total) by mouth daily with breakfast.  Dispense: 30 tablet; Refill: 3 - POC Glucose (CBG)   2. Coronary artery disease with unspecified angina pectoris -s/p CABG, has not followed up with cardiology recently, not taking asa, to restart this back -also needs cardiology follow up - Ambulatory referral to [place] - aspirin (EQ ASPIRIN) 325 MG EC tablet; Take 1 tablet (325 mg total) by mouth daily.  Dispense: 30 tablet; Refill: 2  3. Essential hypertension, benign Blood pressure today was at goal, currently taking lisinopril - Ambulatory referral to [place]  4. Hyperlipidemia Currently  taking pravastatin, will follow up cmp  5. Debility -worsening debility and weakness with falls over the last 6 months, will get Ambulatory referral to [place] - TSH  6. Tachycardia - EKG 12-Lead shows tachycardia, pt with hx of CABG -will follow up cbc, bmp, tsh to also evaluate  Pt has not been seen in 10 months, no recent lab work -stress importance of compliance with instructions given today and follow up next week. Will also have home health evaluation, may need SW as well.  To follow up in 1 week

## 2014-10-05 NOTE — Patient Instructions (Addendum)
Take blood sugars twice daily- once before breakfast and once before dinner-- record and bring log to next visit  Will get blood work today  Cont Metformin daily  Need to record a food log and bring with you at the next visit   Follow up in 1 week   Will get Home health to evaluate weakness and debility

## 2014-10-06 LAB — CBC WITH DIFFERENTIAL/PLATELET
Basophils Absolute: 0 10*3/uL (ref 0.0–0.2)
Basos: 0 %
EOS (ABSOLUTE): 0.1 10*3/uL (ref 0.0–0.4)
Eos: 1 %
HEMATOCRIT: 33.6 % — AB (ref 34.0–46.6)
Hemoglobin: 10.9 g/dL — ABNORMAL LOW (ref 11.1–15.9)
IMMATURE GRANS (ABS): 0 10*3/uL (ref 0.0–0.1)
Immature Granulocytes: 0 %
LYMPHS ABS: 1.4 10*3/uL (ref 0.7–3.1)
Lymphs: 12 %
MCH: 26.3 pg — ABNORMAL LOW (ref 26.6–33.0)
MCHC: 32.4 g/dL (ref 31.5–35.7)
MCV: 81 fL (ref 79–97)
MONOCYTES: 4 %
MONOS ABS: 0.4 10*3/uL (ref 0.1–0.9)
NEUTROS ABS: 9.5 10*3/uL — AB (ref 1.4–7.0)
Neutrophils: 83 %
Platelets: 280 10*3/uL (ref 150–379)
RBC: 4.15 x10E6/uL (ref 3.77–5.28)
RDW: 14.6 % (ref 12.3–15.4)
WBC: 11.4 10*3/uL — AB (ref 3.4–10.8)

## 2014-10-06 LAB — COMPREHENSIVE METABOLIC PANEL
ALT: 8 IU/L (ref 0–32)
AST: 6 IU/L (ref 0–40)
Albumin/Globulin Ratio: 0.9 — ABNORMAL LOW (ref 1.1–2.5)
Albumin: 3.4 g/dL — ABNORMAL LOW (ref 3.5–4.7)
Alkaline Phosphatase: 104 IU/L (ref 39–117)
BUN/Creatinine Ratio: 22 (ref 11–26)
BUN: 20 mg/dL (ref 8–27)
Bilirubin Total: 0.2 mg/dL (ref 0.0–1.2)
CALCIUM: 9.4 mg/dL (ref 8.7–10.3)
CO2: 19 mmol/L (ref 18–29)
CREATININE: 0.92 mg/dL (ref 0.57–1.00)
Chloride: 102 mmol/L (ref 97–108)
GFR calc Af Amer: 68 mL/min/{1.73_m2} (ref >59)
GFR, EST NON AFRICAN AMERICAN: 59 mL/min/{1.73_m2} — AB (ref >59)
GLOBULIN, TOTAL: 3.7 g/dL (ref 1.5–4.5)
GLUCOSE: 306 mg/dL — AB (ref 65–99)
Potassium: 4.3 mmol/L (ref 3.5–5.2)
Sodium: 139 mmol/L (ref 134–144)
TOTAL PROTEIN: 7.1 g/dL (ref 6.0–8.5)

## 2014-10-06 LAB — TSH: TSH: 1.5 u[IU]/mL (ref 0.450–4.500)

## 2014-10-06 LAB — HEMOGLOBIN A1C
ESTIMATED AVERAGE GLUCOSE: 189 mg/dL
Hgb A1c MFr Bld: 8.2 % — ABNORMAL HIGH (ref 4.8–5.6)

## 2014-10-12 ENCOUNTER — Encounter: Payer: Self-pay | Admitting: Nurse Practitioner

## 2014-10-12 ENCOUNTER — Ambulatory Visit (INDEPENDENT_AMBULATORY_CARE_PROVIDER_SITE_OTHER): Payer: Medicare Other | Admitting: Nurse Practitioner

## 2014-10-12 VITALS — BP 120/72 | HR 99 | Temp 97.6°F | Resp 20 | Ht 62.0 in

## 2014-10-12 DIAGNOSIS — I25119 Atherosclerotic heart disease of native coronary artery with unspecified angina pectoris: Secondary | ICD-10-CM | POA: Diagnosis not present

## 2014-10-12 DIAGNOSIS — R8290 Unspecified abnormal findings in urine: Secondary | ICD-10-CM

## 2014-10-12 DIAGNOSIS — E119 Type 2 diabetes mellitus without complications: Secondary | ICD-10-CM | POA: Diagnosis not present

## 2014-10-12 DIAGNOSIS — D72829 Elevated white blood cell count, unspecified: Secondary | ICD-10-CM | POA: Diagnosis not present

## 2014-10-12 DIAGNOSIS — I509 Heart failure, unspecified: Secondary | ICD-10-CM | POA: Diagnosis not present

## 2014-10-12 LAB — POCT URINALYSIS DIPSTICK
BILIRUBIN UA: NEGATIVE
Glucose, UA: NEGATIVE
KETONES UA: NEGATIVE
Nitrite, UA: NEGATIVE
PROTEIN UA: 2000
Spec Grav, UA: >=1.03
UROBILINOGEN UA: 2
pH, UA: 5

## 2014-10-12 MED ORDER — GLUCOSE BLOOD VI STRP: ORAL_STRIP | Status: DC

## 2014-10-12 MED ORDER — ONETOUCH DELICA LANCETS FINE MISC: Status: DC

## 2014-10-12 MED ORDER — ONETOUCH ULTRA SYSTEM W/DEVICE KIT: 1.0000 | PACK | Freq: Once | Status: DC

## 2014-10-12 NOTE — Progress Notes (Signed)
Patient ID: Mckenzie Williamson, female   DOB: 29-Oct-1932, 81 y.o.   MRN: [id number]    PCP: Lauree Chandler, NP  No Known Allergies  Chief Complaint  Patient presents with  . Follow-up    1 week follow-up,Discuss labs (copy printed )     HPI: Patient is a 81 y.o. female seen in the office today to follow up blood sugars and labs. Received a call from home health but has not had visit yet Blood glucose monitor does not appear to be working correctly, did not bring it today -pt with ongoing weakness -no acute concerns.   Review of Systems:  Review of Systems  Constitutional: Positive for activity change. Negative for appetite change, fatigue and unexpected weight change.  Respiratory: Positive for shortness of breath (with any activity- no changes in this).   Cardiovascular: Positive for palpitations (when she gets excited) and leg swelling. Negative for chest pain.  Gastrointestinal: Positive for constipation. Negative for diarrhea.  Genitourinary: Negative for dysuria, frequency and difficulty urinating.       Incontinence of urine   Musculoskeletal: Positive for arthralgias.  Neurological: Positive for weakness.  Psychiatric/Behavioral: Positive for confusion. The patient is nervous/anxious.     Past Medical History  Diagnosis Date  . Coronary artery disease     s/p CABG in 2005; last cath in 2008 showing occlusion of all SVG's;   . Diabetes mellitus   . Hyperlipidemia   . Hypertension   . LV dysfunction     EF 30 to 35% per echo 3/13  . Dizzy   . Hypoglycemia   . Cataract   . CHF (congestive heart failure)   . Non compliance w medication regimen   . Ischemic cardiomyopathy   . Memory difficulty    Past Surgical History  Procedure Laterality Date  . Coronary artery bypass graft  2005    THIS INCLUDED SAPHENOUS VEIN GRAFT TO THE PDA, SAPHENOUS VEIN GRAFT TO OBTUSE MARGINAL VESSEL, AS LIMA GRAFT TO THE LAD.  [facility] Kitchen Cardiac catheterization  2008    EF 50-55%.  SHOWED OCCLUSION OF VEIN GRAFT  . Cholecystectomy    . Transthoracic echocardiogram  05/04/2004    EF 50-55%  . Cardiovascular stress test  12/24/2006    EF 47%   Social History:   reports that she has never smoked. She has never used smokeless tobacco. She reports that she does not drink alcohol or use illicit drugs.  Family History  Problem Relation Age of Onset  . Tuberculosis Mother   . Lung cancer Father   . Diabetes Sister   . Heart attack Brother   . Hypertension Brother   . Diabetes Brother     Medications: Patient's Medications  New Prescriptions   No medications on file  Previous Medications   ASPIRIN (EQ ASPIRIN) 325 MG EC TABLET    Take 1 tablet (325 mg total) by mouth daily.   BLOOD GLUCOSE MONITORING SUPPL (ONE TOUCH ULTRA SYSTEM KIT) W/DEVICE KIT    1 kit by Does not apply route once. Check blood sugar once daily as directed DX 250.00   GLUCOSE BLOOD (ONE TOUCH ULTRA TEST) TEST STRIP    Check blood sugar once daily as directed DX 250.00   INSULIN GLARGINE (LANTUS SOLOSTAR) 100 UNIT/ML SOLOSTAR PEN    Inject 10 Units into the skin daily at 10 pm.   INSULIN PEN NEEDLE 31G X 5 MM MISC    Use daily as directed with insulin. DX 250.00  LISINOPRIL (PRINIVIL,ZESTRIL) 10 MG TABLET    Take 1 tablet (10 mg total) by mouth daily.   METFORMIN (GLUCOPHAGE) 500 MG TABLET    Take 1 tablet (500 mg total) by mouth daily with breakfast.   MULTIPLE VITAMIN (MULTIVITAMIN) TABLET    Take 1 tablet by mouth daily.     ONETOUCH DELICA LANCETS FINE MISC    Check blood sugar once daily as directed DX 250.00   PRAVASTATIN (PRAVACHOL) 40 MG TABLET    Take 1 tablet (40 mg total) by mouth daily.  Modified Medications   No medications on file  Discontinued Medications   No medications on file     Physical Exam:  Filed Vitals:   10/12/14 1000  BP: 120/72  Pulse: 99  Temp: 97.6 F (36.4 C)  TempSrc: Oral  Resp: 20  Height: _0  (1.575 m)  SpO2: 94%    Physical Exam    Constitutional: She appears well-developed and well-nourished.  HENT:  Mouth/Throat: Oropharynx is clear and moist. No oropharyngeal exudate.  Eyes: Conjunctivae and EOM are normal. Pupils are equal, round, and reactive to light.  Neck: Normal range of motion. Neck supple.  Cardiovascular: Normal rate, regular rhythm and normal heart sounds.   Pulmonary/Chest: Effort normal and breath sounds normal.  Abdominal: Soft. Bowel sounds are normal.  Musculoskeletal: She exhibits edema (2+ bilaterally).  Neurological: She is alert.  Skin: Skin is warm and dry.  Psychiatric: Cognition and memory are impaired.    Labs reviewed: Basic Metabolic Panel:  Recent Labs  12/01/13 1700 04/06/14 0830 04/07/14 1145 10/05/14 1131  NA 134 137 136* 139  K 4.7 4.2 5.3 4.3  CL 96* 100 99 102  CO2 _1 GLUCOSE 466* 421* 346* 306*  BUN _2 CREATININE 0.90 0.68 0.59 0.92  CALCIUM 9.6 9.1 9.4 9.4  TSH 0.928  --   --  1.500   Liver Function Tests:  Recent Labs  04/06/14 0830 04/07/14 1145 10/05/14 1131  AST _3 ALT _4 ALKPHOS 124* 108 104  BILITOT 0.5 0.5 0.2  PROT 6.6 6.9 7.1  ALBUMIN 3.0* 3.2*  --     Recent Labs  04/06/14 0830 04/07/14 1145  LIPASE 59 35   No results for input(s): AMMONIA in the last 8760 hours. CBC:  Recent Labs  12/01/13 1700 04/06/14 0830 04/07/14 1348 10/05/14 1131  WBC 6.8 4.1 5.0 11.4*  NEUTROABS 3.7 2.0 2.9 9.5*  HGB 12.0 12.4 12.4  --   HCT 36.6 36.9 38.1 33.6*  MCV 86 83.7 84.9  --   PLT  --  PLATELET CLUMPS NOTED ON SMEAR, UNABLE TO ESTIMATE 250  --    Lipid Panel:  Recent Labs  12/01/13 1700  CHOL 294*  HDL 39*  LDLCALC 184*  TRIG 355*  CHOLHDL 7.5*   TSH:  Recent Labs  12/01/13 1700 10/05/14 1131  TSH 0.928 1.500   A1C: Lab Results  Component Value Date   HGBA1C 8.2* 10/05/2014     Assessment/Plan 1. Chronic congestive heart failure, unspecified congestive heart failure type -has not had  routine follow up with cardiology in several years. - Ambulatory referral to Cardiology  2. Type 2 diabetes mellitus not at goal -reports meter not work, only blood sugar it would read is 130, did not bring meter today.  -review of food log reveals juices given throughout the day -to decrease juice and sodas and encourage water, diet drinks -  whole wheat options instead of white discussed as well as other dietary modifications -home health nursing can also assist with diabetic education.  -new meter order set to pharmacy-- encouraged to get education on how to properly use meter at pharmacy -to check blood sugars TWICE daily and record in log, this discussed with pts son and caregiver multiple times -also to bring meter with them to next visit -cont medications as prescribed  3. Leukocytosis -on blood work, UA abnormal, will send for culture, encouraged hydration and proper hygiene  - CBC with Differential/Platelet - POCT urinalysis dipstick - Urine culture  4. Abnormal urine - Urine culture sent at this time.    Educated in detail with family and caregiver, repeated information  multiple times Follow up in 1 week with Cathey, Pharm D ongoing diabetic management  And follow up in 4 weeks with myself.

## 2014-10-12 NOTE — Patient Instructions (Signed)
Take and record blood sugars twice daily No juices- use water instead Use whole wheat toast  Follow up in 1 week with Rosalva Ferronathey Miller Pharm D on Monday  Will place a referral for cardiology, needs to follow up with cardiologist

## 2014-10-13 ENCOUNTER — Telehealth: Payer: Self-pay

## 2014-10-13 DIAGNOSIS — R296 Repeated falls: Secondary | ICD-10-CM | POA: Diagnosis not present

## 2014-10-13 DIAGNOSIS — I255 Ischemic cardiomyopathy: Secondary | ICD-10-CM | POA: Diagnosis not present

## 2014-10-13 DIAGNOSIS — I10 Essential (primary) hypertension: Secondary | ICD-10-CM | POA: Diagnosis not present

## 2014-10-13 DIAGNOSIS — I25119 Atherosclerotic heart disease of native coronary artery with unspecified angina pectoris: Secondary | ICD-10-CM | POA: Diagnosis not present

## 2014-10-13 DIAGNOSIS — E119 Type 2 diabetes mellitus without complications: Secondary | ICD-10-CM | POA: Diagnosis not present

## 2014-10-13 DIAGNOSIS — I509 Heart failure, unspecified: Secondary | ICD-10-CM | POA: Diagnosis not present

## 2014-10-13 LAB — CBC WITH DIFFERENTIAL/PLATELET
Basophils Absolute: 0 10*3/uL (ref 0.0–0.2)
Basos: 0 %
EOS (ABSOLUTE): 0.1 10*3/uL (ref 0.0–0.4)
Eos: 1 %
HEMATOCRIT: 35.1 % (ref 34.0–46.6)
HEMOGLOBIN: 11.2 g/dL (ref 11.1–15.9)
IMMATURE GRANS (ABS): 0 10*3/uL (ref 0.0–0.1)
Immature Granulocytes: 0 %
LYMPHS: 20 %
Lymphocytes Absolute: 1.7 10*3/uL (ref 0.7–3.1)
MCH: 26.3 pg — ABNORMAL LOW (ref 26.6–33.0)
MCHC: 31.9 g/dL (ref 31.5–35.7)
MCV: 82 fL (ref 79–97)
Monocytes Absolute: 0.5 10*3/uL (ref 0.1–0.9)
Monocytes: 6 %
Neutrophils Absolute: 6.3 10*3/uL (ref 1.4–7.0)
Neutrophils: 73 %
Platelets: 308 10*3/uL (ref 150–379)
RBC: 4.26 x10E6/uL (ref 3.77–5.28)
RDW: 14.9 % (ref 12.3–15.4)
WBC: 8.6 10*3/uL (ref 3.4–10.8)

## 2014-10-13 NOTE — Telephone Encounter (Signed)
Patient with elevated blood sugar of 426 post peanut butter and jelly sandwich. Please advise if any recommendations to be given to patient.

## 2014-10-13 NOTE — Telephone Encounter (Signed)
Should be taking blood sugar BEFORE meals

## 2014-10-14 LAB — URINE CULTURE

## 2014-10-14 NOTE — Telephone Encounter (Signed)
Left message on voicemail informing [name] (home health nurse) and [name] (neighbor) of Mckenzie Williamson's instructions. Both parties were instructed to call if questions or concerns

## 2014-10-19 ENCOUNTER — Other Ambulatory Visit: Payer: Self-pay

## 2014-10-19 ENCOUNTER — Encounter: Payer: Self-pay | Admitting: Nurse Practitioner

## 2014-10-19 ENCOUNTER — Ambulatory Visit (INDEPENDENT_AMBULATORY_CARE_PROVIDER_SITE_OTHER): Payer: Medicare Other | Admitting: Nurse Practitioner

## 2014-10-19 ENCOUNTER — Telehealth: Payer: Self-pay | Admitting: *Deleted

## 2014-10-19 VITALS — BP 122/60 | HR 93 | Temp 98.2°F | Resp 20 | Ht 62.0 in

## 2014-10-19 DIAGNOSIS — I25119 Atherosclerotic heart disease of native coronary artery with unspecified angina pectoris: Secondary | ICD-10-CM

## 2014-10-19 DIAGNOSIS — E1065 Type 1 diabetes mellitus with hyperglycemia: Secondary | ICD-10-CM

## 2014-10-19 DIAGNOSIS — E119 Type 2 diabetes mellitus without complications: Secondary | ICD-10-CM

## 2014-10-19 DIAGNOSIS — I10 Essential (primary) hypertension: Secondary | ICD-10-CM | POA: Diagnosis not present

## 2014-10-19 DIAGNOSIS — E785 Hyperlipidemia, unspecified: Secondary | ICD-10-CM | POA: Diagnosis not present

## 2014-10-19 DIAGNOSIS — E1069 Type 1 diabetes mellitus with other specified complication: Principal | ICD-10-CM

## 2014-10-19 MED ORDER — ONETOUCH DELICA LANCETS FINE MISC: Status: DC

## 2014-10-19 MED ORDER — METFORMIN HCL 500 MG PO TABS: 500.0000 mg | ORAL_TABLET | Freq: Every day | ORAL | Status: DC

## 2014-10-19 MED ORDER — LISINOPRIL 10 MG PO TABS: 10.0000 mg | ORAL_TABLET | Freq: Every day | ORAL | Status: DC

## 2014-10-19 MED ORDER — PRAVASTATIN SODIUM 40 MG PO TABS: 40.0000 mg | ORAL_TABLET | Freq: Every day | ORAL | Status: AC

## 2014-10-19 MED ORDER — PRAVASTATIN SODIUM 40 MG PO TABS: 40.0000 mg | ORAL_TABLET | Freq: Every day | ORAL | Status: DC

## 2014-10-19 NOTE — Telephone Encounter (Signed)
Called pharmacy did not get an answer will call again

## 2014-10-19 NOTE — Progress Notes (Signed)
Patient ID: Mckenzie Williamson, female   DOB: Jun 11, 1932, 81 y.o.   MRN: [id number]    PCP: Lauree Chandler, NP  No Known Allergies  Chief Complaint  Patient presents with  . Medical Management of Chronic Issues    Blood sugar follow-up     HPI: Patient is a 81 y.o. female seen in the office today to follow up blood sugar. Home health has been consult and PT has visited.  Needs refills on medications.  Blood sugars fasting ranging from 133-250 Before dinner 140-257. Reports compliance with medications.  Unsure about what her diet should be. Giving her cool aid instead of juices Nursing planning to come out to the house for more education   Review of Systems:  Review of Systems  Constitutional: Positive for activity change. Negative for appetite change, fatigue and unexpected weight change.  Respiratory: Negative for shortness of breath.   Cardiovascular: Positive for leg swelling. Negative for chest pain and palpitations.  Gastrointestinal: Positive for constipation. Negative for diarrhea.  Genitourinary: Negative for dysuria, frequency and difficulty urinating.       Incontinence of urine   Musculoskeletal: Positive for arthralgias.  Neurological: Positive for weakness.  Psychiatric/Behavioral: Positive for confusion. The patient is nervous/anxious.     Past Medical History  Diagnosis Date  . Coronary artery disease     s/p CABG in 2005; last cath in 2008 showing occlusion of all SVG's;   . Diabetes mellitus   . Hyperlipidemia   . Hypertension   . LV dysfunction     EF 30 to 35% per echo 3/13  . Dizzy   . Hypoglycemia   . Cataract   . CHF (congestive heart failure)   . Non compliance w medication regimen   . Ischemic cardiomyopathy   . Memory difficulty    Past Surgical History  Procedure Laterality Date  . Coronary artery bypass graft  2005    THIS INCLUDED SAPHENOUS VEIN GRAFT TO THE PDA, SAPHENOUS VEIN GRAFT TO OBTUSE MARGINAL VESSEL, AS LIMA GRAFT  TO THE LAD.  Marland Kitchen Cardiac catheterization  2008    EF 50-55%. SHOWED OCCLUSION OF VEIN GRAFT  . Cholecystectomy    . Transthoracic echocardiogram  05/04/2004    EF 50-55%  . Cardiovascular stress test  12/24/2006    EF 47%   Social History:   reports that she has never smoked. She has never used smokeless tobacco. She reports that she does not drink alcohol or use illicit drugs.  Family History  Problem Relation Age of Onset  . Tuberculosis Mother   . Lung cancer Father   . Diabetes Sister   . Heart attack Brother   . Hypertension Brother   . Diabetes Brother     Medications: Patient's Medications  New Prescriptions   No medications on file  Previous Medications   ASPIRIN (EQ ASPIRIN) 325 MG EC TABLET    Take 1 tablet (325 mg total) by mouth daily.   BLOOD GLUCOSE MONITORING SUPPL (ONE TOUCH ULTRA SYSTEM KIT) W/DEVICE KIT    1 kit by Does not apply route once. Check blood sugar once daily as directed DX E10.69   GLUCOSE BLOOD (ONE TOUCH ULTRA TEST) TEST STRIP    Check blood sugar once daily as directed DX 250.00   INSULIN GLARGINE (LANTUS SOLOSTAR) 100 UNIT/ML SOLOSTAR PEN    Inject 10 Units into the skin daily at 10 pm.   INSULIN PEN NEEDLE 31G X 5 MM MISC    Use daily  as directed with insulin. DX 250.00   LISINOPRIL (PRINIVIL,ZESTRIL) 10 MG TABLET    Take 1 tablet (10 mg total) by mouth daily.   METFORMIN (GLUCOPHAGE) 500 MG TABLET    Take 1 tablet (500 mg total) by mouth daily with breakfast.   MULTIPLE VITAMIN (MULTIVITAMIN) TABLET    Take 1 tablet by mouth daily.     ONETOUCH DELICA LANCETS FINE MISC    Check blood sugar once daily as directed DX 250.00   PRAVASTATIN (PRAVACHOL) 40 MG TABLET    Take 1 tablet (40 mg total) by mouth daily.  Modified Medications   No medications on file  Discontinued Medications   BLOOD GLUCOSE MONITORING SUPPL (ONE TOUCH ULTRA SYSTEM KIT) W/DEVICE KIT    1 kit by Does not apply route once. Check blood sugar once daily as directed DX 250.00    GLUCOSE BLOOD (ONE TOUCH ULTRA TEST) TEST STRIP    Check blood sugar once daily as directed DX X21.19   ONETOUCH DELICA LANCETS FINE MISC    Check blood sugar once daily as directed DX E10.69     Physical Exam:  Filed Vitals:   10/19/14 1127  BP: 122/60  Pulse: 93  Temp: 98.2 F (36.8 C)  TempSrc: Oral  Resp: 20  Height: '5\' 2"'  (1.575 m)  SpO2: 93%    Physical Exam  Constitutional: She appears well-developed and well-nourished.  HENT:  Mouth/Throat: Oropharynx is clear and moist. No oropharyngeal exudate.  Eyes: Conjunctivae and EOM are normal. Pupils are equal, round, and reactive to light.  Neck: Normal range of motion. Neck supple.  Cardiovascular: Normal rate, regular rhythm and normal heart sounds.   Pulmonary/Chest: Effort normal and breath sounds normal.  Abdominal: Soft. Bowel sounds are normal.  Musculoskeletal: She exhibits edema (2+ bilaterally).  Neurological: She is alert.  Skin: Skin is warm and dry.  Psychiatric: Her affect is blunt. Cognition and memory are impaired.    Labs reviewed: Basic Metabolic Panel:  Recent Labs  12/01/13 1700 04/06/14 0830 04/07/14 1145 10/05/14 1131  NA 134 137 136* 139  K 4.7 4.2 5.3 4.3  CL 96* 100 99 102  CO2 '22 24 23 19  ' GLUCOSE 466* 421* 346* 306*  BUN '14 9 6 20  ' CREATININE 0.90 0.68 0.59 0.92  CALCIUM 9.6 9.1 9.4 9.4  TSH 0.928  --   --  1.500   Liver Function Tests:  Recent Labs  04/06/14 0830 04/07/14 1145 10/05/14 1131  AST '6 22 6  ' ALT '7 10 8  ' ALKPHOS 124* 108 104  BILITOT 0.5 0.5 0.2  PROT 6.6 6.9 7.1  ALBUMIN 3.0* 3.2*  --     Recent Labs  04/06/14 0830 04/07/14 1145  LIPASE 59 35   No results for input(s): AMMONIA in the last 8760 hours. CBC:  Recent Labs  12/01/13 1700  04/06/14 0830 04/07/14 1348 10/05/14 1131 10/12/14 1053  WBC 6.8  < > 4.1 5.0 11.4* 8.6  NEUTROABS 3.7  < > 2.0 2.9 9.5* 6.3  HGB 12.0  --  12.4 12.4  --   --   HCT 36.6  --  36.9 38.1 33.6* 35.1  MCV 86  --   83.7 84.9  --   --   PLT  --   --  PLATELET CLUMPS NOTED ON SMEAR, UNABLE TO ESTIMATE 250  --   --   < > = values in this interval not displayed. Lipid Panel:  Recent Labs  12/01/13 1700  CHOL  294*  HDL 39*  LDLCALC 184*  TRIG 355*  CHOLHDL 7.5*   TSH:  Recent Labs  12/01/13 1700 10/05/14 1131  TSH 0.928 1.500   A1C: Lab Results  Component Value Date   HGBA1C 8.2* 10/05/2014     Assessment/Plan 1. Type 2 diabetes mellitus not at goal -blood sugars reviewed, no hypoglycemia noted, clearly family needs a lot of education regarding diet and diabetes. Time spent educating patient on diabetic diet.  -reports compliance with medications -to cont lantus and metformin at this time -to cont checking blood sugars twice daily  - metFORMIN (GLUCOPHAGE) 500 MG tablet; Take 1 tablet (500 mg total) by mouth daily with breakfast.  Dispense: 30 tablet; Refill: 3 - Amb ref to [facility] for ongoing diabetes education for family and pt  2. Hyperlipidemia - to cont pravstatin - pravastatin (PRAVACHOL) 40 MG tablet; Take 1 tablet (40 mg total) by mouth daily.  Dispense: 30 tablet; Refill: 3  3. Essential hypertension -blood pressure well controlled at this time - lisinopril (PRINIVIL,ZESTRIL) 10 MG tablet; Take 1 tablet (10 mg total) by mouth daily.  Dispense: 30 tablet; Refill: 3  To follow up in 6 weeks with Dr Eulas Post

## 2014-10-19 NOTE — Telephone Encounter (Signed)
[name] with [facility] called wanting verbal orders for patient for [facility]. Verbal given and agreed.

## 2014-11-01 ENCOUNTER — Telehealth: Payer: Self-pay | Admitting: *Deleted

## 2014-11-01 NOTE — Telephone Encounter (Signed)
Tanya with insurance company called and stated that the son called her and told her that we would not give him a Rx for a pain medication. Stated that he started talking about himself and she felt like he was trying to get pain medication for himself not the patient and felt uncomfortable with the situation. She stated that he kept changing the subject frequently and then told her that he was going to have to start watching the mail for the pain medication to come. She told him to watch for it and wanted to let us know about the situation. I reviewed the medication list and patient is not on any pain medication. She stated that she though he may have been trying to get pain medication for patient but take it for himself.

## 2014-11-08 ENCOUNTER — Telehealth: Payer: Self-pay | Admitting: *Deleted

## 2014-11-08 ENCOUNTER — Encounter (HOSPITAL_COMMUNITY): Payer: Self-pay | Admitting: *Deleted

## 2014-11-08 ENCOUNTER — Emergency Department (HOSPITAL_COMMUNITY)
Admission: EM | Admit: 2014-11-08 | Discharge: 2014-11-08 | Disposition: A | Discharge status: Home / Self Care | Payer: Medicare Other | Attending: Emergency Medicine | Admitting: Emergency Medicine

## 2014-11-08 DIAGNOSIS — I10 Essential (primary) hypertension: Secondary | ICD-10-CM | POA: Diagnosis not present

## 2014-11-08 DIAGNOSIS — I509 Heart failure, unspecified: Secondary | ICD-10-CM | POA: Diagnosis not present

## 2014-11-08 DIAGNOSIS — E119 Type 2 diabetes mellitus without complications: Secondary | ICD-10-CM | POA: Diagnosis not present

## 2014-11-08 DIAGNOSIS — K5641 Fecal impaction: Secondary | ICD-10-CM

## 2014-11-08 DIAGNOSIS — Z794 Long term (current) use of insulin: Secondary | ICD-10-CM | POA: Diagnosis not present

## 2014-11-08 DIAGNOSIS — R109 Unspecified abdominal pain: Secondary | ICD-10-CM | POA: Diagnosis present

## 2014-11-08 DIAGNOSIS — I2510 Atherosclerotic heart disease of native coronary artery without angina pectoris: Secondary | ICD-10-CM | POA: Diagnosis not present

## 2014-11-08 DIAGNOSIS — Z951 Presence of aortocoronary bypass graft: Secondary | ICD-10-CM | POA: Diagnosis not present

## 2014-11-08 DIAGNOSIS — H269 Unspecified cataract: Secondary | ICD-10-CM | POA: Diagnosis not present

## 2014-11-08 DIAGNOSIS — Z9119 Patient's noncompliance with other medical treatment and regimen: Secondary | ICD-10-CM | POA: Insufficient documentation

## 2014-11-08 DIAGNOSIS — Z9889 Other specified postprocedural states: Secondary | ICD-10-CM | POA: Diagnosis not present

## 2014-11-08 DIAGNOSIS — K644 Residual hemorrhoidal skin tags: Secondary | ICD-10-CM | POA: Insufficient documentation

## 2014-11-08 DIAGNOSIS — E785 Hyperlipidemia, unspecified: Secondary | ICD-10-CM | POA: Diagnosis not present

## 2014-11-08 DIAGNOSIS — Z79899 Other long term (current) drug therapy: Secondary | ICD-10-CM | POA: Diagnosis not present

## 2014-11-08 DIAGNOSIS — Z7982 Long term (current) use of aspirin: Secondary | ICD-10-CM | POA: Diagnosis not present

## 2014-11-08 DIAGNOSIS — K649 Unspecified hemorrhoids: Secondary | ICD-10-CM

## 2014-11-08 LAB — URINE MICROSCOPIC-ADD ON

## 2014-11-08 LAB — URINALYSIS, ROUTINE W REFLEX MICROSCOPIC
GLUCOSE, UA: NEGATIVE mg/dL
HGB URINE DIPSTICK: NEGATIVE
KETONES UR: 15 mg/dL — AB
Nitrite: NEGATIVE
PH: 5 (ref 5.0–8.0)
Protein, ur: 100 mg/dL — AB
SPECIFIC GRAVITY, URINE: 1.02 (ref 1.005–1.030)
Urobilinogen, UA: 1 mg/dL (ref 0.0–1.0)

## 2014-11-08 MED ORDER — GLYCERIN (LAXATIVE) 2.1 G RE SUPP
1.0000 | Freq: Once | RECTAL | Status: AC
  Administered 2014-11-08: 1 via RECTAL
  Filled 2014-11-08: qty 1

## 2014-11-08 MED ORDER — POLYETHYLENE GLYCOL 3350 17 G PO PACK: 17.0000 g | PACK | Freq: Every day | ORAL | Status: DC | PRN

## 2014-11-08 MED ORDER — MORPHINE SULFATE 4 MG/ML IJ SOLN
4.0000 mg | Freq: Once | INTRAMUSCULAR | Status: AC
  Administered 2014-11-08: 4 mg via INTRAVENOUS
  Filled 2014-11-08: qty 1

## 2014-11-08 NOTE — ED Notes (Signed)
Pt arrives from home via [facility]. Pt states she was having a BM and she strained too hard and "felt something drop". EMS was paged out for abdominal pain and hemorrhoids. Upon inspection no rectal prolapse noted, there is an external hemorrhoid noted on assessment.

## 2014-11-08 NOTE — Telephone Encounter (Signed)
Verbal order given for patient to have OT through [place]

## 2014-11-08 NOTE — Discharge Instructions (Signed)
Fecal Impaction °A fecal impaction happens when there is a large, firm amount of stool (or feces) that cannot be passed. The impacted stool is usually in the rectum, which is the lowest part of the large bowel. The impacted stool can block the colon and cause significant problems. °CAUSES  °The longer stool stays in the rectum, the harder it gets. Anything that slows down your bowel movements can lead to fecal impaction, such as: °· Constipation. This can be a long-standing (chronic) problem or can happen suddenly (acute). °· Painful conditions of the rectum, such as hemorrhoids or anal fissures. The pain of these conditions can make you try to avoid having bowel movements. °· Narcotic pain-relieving medicines, such as methadone, morphine, or codeine. °· Not drinking enough fluids. °· Inactivity and bed rest over long periods of time. °· Diseases of the brain or nervous system that damage the nerves controlling the muscles of the intestines. °SIGNS AND SYMPTOMS  °· Lack of normal bowel movements or changes in bowel patterns. °· Sense of fullness in the rectum but unable to pass stool. °· Pain or cramps in the abdominal area (often after meals). °· Thin, watery discharge from the rectum. °DIAGNOSIS  °Your health care provider may suspect that you have a fecal impaction based on your symptoms and a physical exam. This will include an exam of your rectum. Sometimes X-rays or lab testing may be needed to confirm the diagnosis and to be sure there are no other problems.  °TREATMENT  °· Initially an impaction can be removed manually. Using a gloved finger, your health care provider can remove hard stool from your rectum. °· Medicine is sometimes needed. A suppository or enema can be given in the rectum to soften the stool, which can stimulate a bowel movement. Medicines can also be given by mouth (orally). °· Though rare, surgery may be needed if the colon has torn (perforated) due to blockage. °HOME CARE INSTRUCTIONS   °· Develop regular bowel habits. This could include getting in the habit of having a bowel movement after your morning cup of coffee or after eating. Be sure to allow yourself enough time on the toilet. °· Maintain a high-fiber diet. °· Drink enough fluids to keep your urine clear or pale yellow as directed by your health care provider. °· Exercise regularly. °· If you begin to get constipated, increase the amount of fiber in your diet. Eat plenty of fruits, vegetables, whole wheat breads, bran, oatmeal, and similar products. °· Take natural fiber laxatives or other laxatives only as directed by your health care provider. °SEEK MEDICAL CARE IF:  °· You have ongoing rectal pain. °· You require enemas or suppositories more than twice a week. °· You have rectal bleeding. °· You have continued problems, or you develop abdominal pain. °· You have thin, pencil-like stools. °SEEK IMMEDIATE MEDICAL CARE IF:  °You have black or tarry stools. °MAKE SURE YOU:  °· Understand these instructions. °· Will watch your condition. °· Will get help right away if you are not doing well or get worse. °Document Released: 02/04/2004 Document Revised: 03/04/2013 Document Reviewed: 11/18/2012 °[employer]® Patient Information ©2015 [employer]. This information is not intended to replace advice given to you by your health care provider. Make sure you discuss any questions you have with your health care provider. ° °

## 2014-11-08 NOTE — ED Provider Notes (Signed)
CSN: [id number]     Arrival date & time 11/08/14  1654 History   First MD Initiated Contact with Patient 11/08/14 1700     Chief Complaint  Patient presents with  . Abdominal Pain  . Hemorrhoids     (Consider location/radiation/quality/duration/timing/severity/associated sxs/prior Treatment) Patient is a 81 y.o. female presenting with constipation.  Constipation Severity:  Severe Timing:  Constant Progression:  Worsening Chronicity:  New Stool description:  None produced Relieved by:  Nothing Worsened by:  Nothing tried Ineffective treatments: preparation H. Associated symptoms: abdominal pain   Associated symptoms: no diarrhea, no dysuria, no fever, no nausea and no vomiting     Past Medical History  Diagnosis Date  . Coronary artery disease     s/p CABG in 2005; last cath in 2008 showing occlusion of all SVG's;   . Diabetes mellitus   . Hyperlipidemia   . Hypertension   . LV dysfunction     EF 30 to 35% per echo 3/13  . Dizzy   . Hypoglycemia   . Cataract   . CHF (congestive heart failure)   . Non compliance w medication regimen   . Ischemic cardiomyopathy   . Memory difficulty    Past Surgical History  Procedure Laterality Date  . Coronary artery bypass graft  2005    THIS INCLUDED SAPHENOUS VEIN GRAFT TO THE PDA, SAPHENOUS VEIN GRAFT TO OBTUSE MARGINAL VESSEL, AS LIMA GRAFT TO THE LAD.  Marland Kitchen Cardiac catheterization  2008    EF 50-55%. SHOWED OCCLUSION OF VEIN GRAFT  . Cholecystectomy    . Transthoracic echocardiogram  05/04/2004    EF 50-55%  . Cardiovascular stress test  12/24/2006    EF 47%   Family History  Problem Relation Age of Onset  . Tuberculosis Mother   . Lung cancer Father   . Diabetes Sister   . Heart attack Brother   . Hypertension Brother   . Diabetes Brother    History  Substance Use Topics  . Smoking status: Never Smoker   . Smokeless tobacco: Never Used  . Alcohol Use: No   OB History    No data available     Review of Systems   Constitutional: Negative for fever.  Gastrointestinal: Positive for abdominal pain and constipation. Negative for nausea, vomiting and diarrhea.  Genitourinary: Negative for dysuria.  All other systems reviewed and are negative.     Allergies  Review of patient's allergies indicates no known allergies.  Home Medications   Prior to Admission medications   Medication Sig Start Date End Date Taking? Authorizing Provider  Blood Glucose Monitoring Suppl (ONE TOUCH ULTRA SYSTEM KIT) W/DEVICE KIT 1 kit by Does not apply route once. Check blood sugar once daily as directed DX E10.69 10/12/14  Yes Lauree Chandler, NP  glucose blood (ONE TOUCH ULTRA TEST) test strip Check blood sugar once daily as directed DX 250.00 10/12/14  Yes Lauree Chandler, NP  Insulin Glargine (LANTUS SOLOSTAR) 100 UNIT/ML Solostar Pen Inject 10 Units into the skin daily at 10 pm. 04/06/14  Yes Rolland Porter, MD  lisinopril (PRINIVIL,ZESTRIL) 10 MG tablet Take 1 tablet (10 mg total) by mouth daily. 10/19/14 10/19/15 Yes Lauree Chandler, NP  metFORMIN (GLUCOPHAGE) 500 MG tablet Take 1 tablet (500 mg total) by mouth daily with breakfast. 10/19/14  Yes Lauree Chandler, NP  Multiple Vitamin (MULTIVITAMIN) tablet Take 1 tablet by mouth daily.     Yes Historical Provider, MD  pravastatin (PRAVACHOL) 40 MG tablet  Take 1 tablet (40 mg total) by mouth daily. 10/19/14  Yes Lauree Chandler, NP  aspirin (EQ ASPIRIN) 325 MG EC tablet Take 1 tablet (325 mg total) by mouth daily. Patient not taking: Reported on 10/19/2014 10/05/14   Lauree Chandler, NP  Insulin Pen Needle 31G X 5 MM MISC Use daily as directed with insulin. DX 250.00 04/06/14   Rolland Porter, MD  [facility] DELICA LANCETS FINE MISC Check blood sugar once daily as directed Dx E10.69 10/19/14   Lauree Chandler, NP   BP 157/100 mmHg  Pulse 113  Temp(Src) 99 F (37.2 C) (Oral)  Resp 16  Ht '5\' 3"'  (1.6 m)  Wt 146 lb (66.225 kg)  BMI 25.87 kg/m2  SpO2 96% Physical Exam   Constitutional: She is oriented to person, place, and time. She appears well-developed and well-nourished. No distress.  HENT:  Head: Normocephalic and atraumatic.  Eyes: Conjunctivae are normal. No scleral icterus.  Neck: Neck supple.  Cardiovascular: Normal rate and intact distal pulses.   Pulmonary/Chest: Effort normal. No stridor. No respiratory distress.  Abdominal: Normal appearance. She exhibits no distension. There is generalized tenderness (mild). There is no rigidity, no rebound and no guarding.  Genitourinary: Rectal exam shows external hemorrhoid (small).  Large fecal impaction  Neurological: She is alert and oriented to person, place, and time.  Skin: Skin is warm and dry. No rash noted.  Psychiatric: She has a normal mood and affect. Her behavior is normal.  Nursing note and vitals reviewed.   ED Course  Fecal disimpaction Date/Time: 11/08/2014 7:40 PM Performed by: Serita Grit Authorized by: Serita Grit Consent: Verbal consent obtained. Risks and benefits: risks, benefits and alternatives were discussed Consent given by: patient Comments: Disimpaction performed manually by me with moderate success before discomfort prevented further attempts.     (including critical care time) Labs Review Labs Reviewed  URINALYSIS, ROUTINE W REFLEX MICROSCOPIC (NOT AT [facility]) - Abnormal; Notable for the following:    Color, Urine AMBER (*)    Bilirubin Urine SMALL (*)    Ketones, ur 15 (*)    Protein, ur 100 (*)    Leukocytes, UA MODERATE (*)    All other components within normal limits  URINE MICROSCOPIC-ADD ON - Abnormal; Notable for the following:    Squamous Epithelial / LPF MANY (*)    Bacteria, UA FEW (*)    All other components within normal limits    Imaging Review No results found.   EKG Interpretation None      MDM   Final diagnoses:  Fecal impaction  Hemorrhoids, unspecified hemorrhoid type    81 yo female with CC of rectal pain and constipation.   On exam, had small hemorrhoid and fecal impaction.  She was manually disimpacted and felt significant relief.  Multiple repeat abdominal exams showed soft abdomen without tenderness, rebound, rigidity, or guarding.  Advised bowel regimen and close PCP follow up.  She was also given strict return precautions, including abdominal pain, vomiting, or fevers.      Serita Grit, MD 11/08/14 [phone]
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
## 2014-11-09 ENCOUNTER — Telehealth: Payer: Self-pay | Admitting: *Deleted

## 2014-11-09 NOTE — Telephone Encounter (Signed)
Patient caregiver, Marily Memos called and stated that they had to take patient to hospital yesterday. Stated that it was for an impaction. Caregiver said she was going to get patient a stool softner OTC until appointment in July. I offered several appointments from now until then, but none worked for her. Stated that she would just wait until scheduled appointment time.

## 2014-11-10 ENCOUNTER — Telehealth: Payer: Self-pay | Admitting: *Deleted

## 2014-11-10 ENCOUNTER — Emergency Department (HOSPITAL_COMMUNITY): Payer: Medicare Other

## 2014-11-10 ENCOUNTER — Encounter (HOSPITAL_COMMUNITY): Payer: Self-pay | Admitting: *Deleted

## 2014-11-10 ENCOUNTER — Emergency Department (HOSPITAL_COMMUNITY)
Admission: EM | Admit: 2014-11-10 | Discharge: 2014-11-10 | Disposition: A | Discharge status: Home / Self Care | Payer: Medicare Other | Attending: Emergency Medicine | Admitting: Emergency Medicine

## 2014-11-10 DIAGNOSIS — Z9889 Other specified postprocedural states: Secondary | ICD-10-CM | POA: Diagnosis not present

## 2014-11-10 DIAGNOSIS — Z794 Long term (current) use of insulin: Secondary | ICD-10-CM | POA: Diagnosis not present

## 2014-11-10 DIAGNOSIS — Z9114 Patient's other noncompliance with medication regimen: Secondary | ICD-10-CM | POA: Insufficient documentation

## 2014-11-10 DIAGNOSIS — I10 Essential (primary) hypertension: Secondary | ICD-10-CM | POA: Insufficient documentation

## 2014-11-10 DIAGNOSIS — Z9049 Acquired absence of other specified parts of digestive tract: Secondary | ICD-10-CM | POA: Diagnosis not present

## 2014-11-10 DIAGNOSIS — Z951 Presence of aortocoronary bypass graft: Secondary | ICD-10-CM | POA: Diagnosis not present

## 2014-11-10 DIAGNOSIS — R109 Unspecified abdominal pain: Secondary | ICD-10-CM | POA: Diagnosis present

## 2014-11-10 DIAGNOSIS — Z79899 Other long term (current) drug therapy: Secondary | ICD-10-CM | POA: Diagnosis not present

## 2014-11-10 DIAGNOSIS — E119 Type 2 diabetes mellitus without complications: Secondary | ICD-10-CM | POA: Insufficient documentation

## 2014-11-10 DIAGNOSIS — Z7982 Long term (current) use of aspirin: Secondary | ICD-10-CM | POA: Diagnosis not present

## 2014-11-10 DIAGNOSIS — E785 Hyperlipidemia, unspecified: Secondary | ICD-10-CM | POA: Insufficient documentation

## 2014-11-10 DIAGNOSIS — K5900 Constipation, unspecified: Secondary | ICD-10-CM | POA: Diagnosis not present

## 2014-11-10 DIAGNOSIS — H269 Unspecified cataract: Secondary | ICD-10-CM | POA: Diagnosis not present

## 2014-11-10 DIAGNOSIS — I2510 Atherosclerotic heart disease of native coronary artery without angina pectoris: Secondary | ICD-10-CM | POA: Diagnosis not present

## 2014-11-10 DIAGNOSIS — I509 Heart failure, unspecified: Secondary | ICD-10-CM | POA: Diagnosis not present

## 2014-11-10 LAB — COMPREHENSIVE METABOLIC PANEL
ALBUMIN: 2.9 g/dL — AB (ref 3.5–5.0)
ALT: 11 U/L — ABNORMAL LOW (ref 14–54)
ANION GAP: 8 (ref 5–15)
AST: 16 U/L (ref 15–41)
Alkaline Phosphatase: 73 U/L (ref 38–126)
BUN: 8 mg/dL (ref 6–20)
CHLORIDE: 103 mmol/L (ref 101–111)
CO2: 28 mmol/L (ref 22–32)
CREATININE: 0.77 mg/dL (ref 0.44–1.00)
Calcium: 9 mg/dL (ref 8.9–10.3)
GFR calc Af Amer: >60 mL/min (ref >60)
Glucose, Bld: 75 mg/dL (ref 65–99)
Potassium: 3.4 mmol/L — ABNORMAL LOW (ref 3.5–5.1)
Sodium: 139 mmol/L (ref 135–145)
Total Bilirubin: 0.7 mg/dL (ref 0.3–1.2)
Total Protein: 6.6 g/dL (ref 6.5–8.1)

## 2014-11-10 LAB — URINALYSIS, ROUTINE W REFLEX MICROSCOPIC
GLUCOSE, UA: NEGATIVE mg/dL
Hgb urine dipstick: NEGATIVE
Ketones, ur: NEGATIVE mg/dL
Nitrite: NEGATIVE
Protein, ur: 30 mg/dL — AB
SPECIFIC GRAVITY, URINE: 1.021 (ref 1.005–1.030)
Urobilinogen, UA: 1 mg/dL (ref 0.0–1.0)
pH: 5 (ref 5.0–8.0)

## 2014-11-10 LAB — CBC WITH DIFFERENTIAL/PLATELET
Basophils Absolute: 0 10*3/uL (ref 0.0–0.1)
Basophils Relative: 0 % (ref 0–1)
Eosinophils Absolute: 0.1 10*3/uL (ref 0.0–0.7)
Eosinophils Relative: 2 % (ref 0–5)
HEMATOCRIT: 33.8 % — AB (ref 36.0–46.0)
HEMOGLOBIN: 10.7 g/dL — AB (ref 12.0–15.0)
LYMPHS ABS: 3.1 10*3/uL (ref 0.7–4.0)
LYMPHS PCT: 36 % (ref 12–46)
MCH: 25.6 pg — ABNORMAL LOW (ref 26.0–34.0)
MCHC: 31.7 g/dL (ref 30.0–36.0)
MCV: 80.9 fL (ref 78.0–100.0)
MONO ABS: 0.6 10*3/uL (ref 0.1–1.0)
MONOS PCT: 7 % (ref 3–12)
NEUTROS PCT: 55 % (ref 43–77)
Neutro Abs: 4.8 10*3/uL (ref 1.7–7.7)
Platelets: 409 10*3/uL — ABNORMAL HIGH (ref 150–400)
RBC: 4.18 MIL/uL (ref 3.87–5.11)
RDW: 15.4 % (ref 11.5–15.5)
WBC: 8.7 10*3/uL (ref 4.0–10.5)

## 2014-11-10 LAB — URINE MICROSCOPIC-ADD ON

## 2014-11-10 MED ORDER — IOHEXOL 300 MG/ML  SOLN
100.0000 mL | Freq: Once | INTRAMUSCULAR | Status: AC | PRN
  Administered 2014-11-10: 100 mL via INTRAVENOUS

## 2014-11-10 MED ORDER — POLYETHYLENE GLYCOL 3350 17 G PO PACK: 17.0000 g | PACK | Freq: Two times a day (BID) | ORAL | Status: DC

## 2014-11-10 MED ORDER — MINERAL OIL RE ENEM
1.0000 | ENEMA | Freq: Once | RECTAL | Status: AC
  Administered 2014-11-10: 1 via RECTAL
  Filled 2014-11-10: qty 1

## 2014-11-10 MED ORDER — AMBULATORY NON FORMULARY MEDICATION: Status: DC

## 2014-11-10 MED ORDER — MORPHINE SULFATE 4 MG/ML IJ SOLN
4.0000 mg | Freq: Once | INTRAMUSCULAR | Status: AC
  Administered 2014-11-10: 4 mg via INTRAVENOUS
  Filled 2014-11-10: qty 1

## 2014-11-10 MED ORDER — ONDANSETRON HCL 4 MG/2ML IJ SOLN
4.0000 mg | Freq: Once | INTRAMUSCULAR | Status: AC
  Administered 2014-11-10: 4 mg via INTRAVENOUS
  Filled 2014-11-10: qty 2

## 2014-11-10 MED ORDER — IOHEXOL 300 MG/ML  SOLN
25.0000 mL | Freq: Once | INTRAMUSCULAR | Status: AC | PRN
  Administered 2014-11-10: 25 mL via ORAL

## 2014-11-10 MED ORDER — DOCUSATE SODIUM 100 MG PO CAPS: 100.0000 mg | ORAL_CAPSULE | Freq: Two times a day (BID) | ORAL | Status: DC

## 2014-11-10 NOTE — ED Provider Notes (Signed)
CSN: [id number]     Arrival date & time 11/10/14  4098 History   First MD Initiated Contact with Patient 11/10/14 0510     Chief Complaint  Patient presents with  . Abdominal Pain     (Consider location/radiation/quality/duration/timing/severity/associated sxs/prior Treatment) HPI 81 year old female presents to emergency department via EMS from home with complaint of abdominal pain.  Patient was seen on Monday, 2 days ago with complaint of abdominal pain, constipation and hemorrhoids.  Patient had fecal disimpaction in the emergency department.  She was started on Mira lax.  Patient is unsure if she is started on Mira lax yet.  She reports abdominal pain has continued.  She reports one episode of vomiting.  Patient denies any fever, no dysuria.  She has had chills.  Patient has history of diabetes, coronary disease.   Past Medical History  Diagnosis Date  . Coronary artery disease     s/p CABG in 2005; last cath in 2008 showing occlusion of all SVG's;   . Diabetes mellitus   . Hyperlipidemia   . Hypertension   . LV dysfunction     EF 30 to 35% per echo 3/13  . Dizzy   . Hypoglycemia   . Cataract   . CHF (congestive heart failure)   . Non compliance w medication regimen   . Ischemic cardiomyopathy   . Memory difficulty    Past Surgical History  Procedure Laterality Date  . Coronary artery bypass graft  2005    THIS INCLUDED SAPHENOUS VEIN GRAFT TO THE PDA, SAPHENOUS VEIN GRAFT TO OBTUSE MARGINAL VESSEL, AS LIMA GRAFT TO THE LAD.  Marland Kitchen Cardiac catheterization  2008    EF 50-55%. SHOWED OCCLUSION OF VEIN GRAFT  . Cholecystectomy    . Transthoracic echocardiogram  05/04/2004    EF 50-55%  . Cardiovascular stress test  12/24/2006    EF 47%   Family History  Problem Relation Age of Onset  . Tuberculosis Mother   . Lung cancer Father   . Diabetes Sister   . Heart attack Brother   . Hypertension Brother   . Diabetes Brother    History  Substance Use Topics  . Smoking status:  Never Smoker   . Smokeless tobacco: Never Used  . Alcohol Use: No   OB History    No data available     Review of Systems   See History of Present Illness; otherwise all other systems are reviewed and negative  Allergies  Review of patient's allergies indicates no known allergies.  Home Medications   Prior to Admission medications   Medication Sig Start Date End Date Taking? Authorizing Provider  aspirin (EQ ASPIRIN) 325 MG EC tablet Take 1 tablet (325 mg total) by mouth daily. Patient not taking: Reported on 10/19/2014 10/05/14   Lauree Chandler, NP  Blood Glucose Monitoring Suppl (ONE TOUCH ULTRA SYSTEM KIT) W/DEVICE KIT 1 kit by Does not apply route once. Check blood sugar once daily as directed DX E10.69 10/12/14   Lauree Chandler, NP  glucose blood (ONE TOUCH ULTRA TEST) test strip Check blood sugar once daily as directed DX 250.00 10/12/14   Lauree Chandler, NP  Insulin Glargine (LANTUS SOLOSTAR) 100 UNIT/ML Solostar Pen Inject 10 Units into the skin daily at 10 pm. 04/06/14   Rolland Porter, MD  Insulin Pen Needle 31G X 5 MM MISC Use daily as directed with insulin. DX 250.00 04/06/14   Rolland Porter, MD  lisinopril (PRINIVIL,ZESTRIL) 10 MG tablet Take 1  tablet (10 mg total) by mouth daily. 10/19/14 10/19/15  Lauree Chandler, NP  metFORMIN (GLUCOPHAGE) 500 MG tablet Take 1 tablet (500 mg total) by mouth daily with breakfast. 10/19/14   Lauree Chandler, NP  Multiple Vitamin (MULTIVITAMIN) tablet Take 1 tablet by mouth daily.      Historical Provider, MD  [facility] DELICA LANCETS FINE MISC Check blood sugar once daily as directed Dx E10.69 10/19/14   Lauree Chandler, NP  polyethylene glycol [facility]) packet Take 17 g by mouth daily as needed for severe constipation. 11/08/14   Serita Grit, MD  pravastatin (PRAVACHOL) 40 MG tablet Take 1 tablet (40 mg total) by mouth daily. 10/19/14   Lauree Chandler, NP   BP 150/73 mmHg  Pulse 88  Temp(Src) 98.4 F (36.9 C) (Oral)  Resp 16  SpO2  97% Physical Exam  Constitutional: She is oriented to person, place, and time. She appears well-developed and well-nourished.  HENT:  Head: Normocephalic and atraumatic.  Nose: Nose normal.  Mouth/Throat: Oropharynx is clear and moist.  Eyes: Conjunctivae and EOM are normal. Pupils are equal, round, and reactive to light.  Neck: Normal range of motion. Neck supple. No JVD present. No tracheal deviation present. No thyromegaly present.  Cardiovascular: Normal rate, regular rhythm, normal heart sounds and intact distal pulses.  Exam reveals no gallop and no friction rub.   No murmur heard. Pulmonary/Chest: Effort normal and breath sounds normal. No stridor. No respiratory distress. She has no wheezes. She has no rales. She exhibits no tenderness.  Abdominal: Soft. She exhibits no distension and no mass. There is tenderness (patient has moderate diffuse abdominal tenderness.  Decreased bowel sounds.). There is no rebound and no guarding.  Musculoskeletal: Normal range of motion. She exhibits no edema or tenderness.  Lymphadenopathy:    She has no cervical adenopathy.  Neurological: She is alert and oriented to person, place, and time. She displays normal reflexes. She exhibits normal muscle tone. Coordination normal.  Skin: Skin is warm and dry. No rash noted. No erythema. No pallor.  Psychiatric: She has a normal mood and affect. Her behavior is normal. Judgment and thought content normal.  Nursing note and vitals reviewed.   ED Course  Procedures (including critical care time) Labs Review Labs Reviewed  CBC WITH DIFFERENTIAL/PLATELET  COMPREHENSIVE METABOLIC PANEL  URINALYSIS, ROUTINE W REFLEX MICROSCOPIC (NOT AT [facility])    Imaging Review No results found.   EKG Interpretation None      MDM   Final diagnoses:  None    81 year old female who returns with abdominal pain after recent disimpaction and constipation.  She reports that she has had a bowel movement today.  Plan for  labs and CT abdomen pelvis.  Care passed to Dr Regenia Skeeter at change of shift awaiting CT scan  Linton Flemings, MD 11/10/14 1843

## 2014-11-10 NOTE — Discharge Instructions (Signed)
Constipation °Constipation is when a person has fewer than three bowel movements a week, has difficulty having a bowel movement, or has stools that are dry, hard, or larger than normal. As people grow older, constipation is more common. If you try to fix constipation with medicines that make you have a bowel movement (laxatives), the problem may get worse. Long-term laxative use may cause the muscles of the colon to become weak. A low-fiber diet, not taking in enough fluids, and taking certain medicines may make constipation worse.  °CAUSES  °· Certain medicines, such as antidepressants, pain medicine, iron supplements, antacids, and water pills.   °· Certain diseases, such as diabetes, irritable bowel syndrome (IBS), thyroid disease, or depression.   °· Not drinking enough water.   °· Not eating enough fiber-rich foods.   °· Stress or travel.   °· Lack of physical activity or exercise.   °· Ignoring the urge to have a bowel movement.   °· Using laxatives too much.   °SIGNS AND SYMPTOMS  °· Having fewer than three bowel movements a week.   °· Straining to have a bowel movement.   °· Having stools that are hard, dry, or larger than normal.   °· Feeling full or bloated.   °· Pain in the lower abdomen.   °· Not feeling relief after having a bowel movement.   °DIAGNOSIS  °Your health care provider will take a medical history and perform a physical exam. Further testing may be done for severe constipation. Some tests may include: °· A barium enema X-ray to examine your rectum, colon, and, sometimes, your small intestine.   °· A sigmoidoscopy to examine your lower colon.   °· A colonoscopy to examine your entire colon. °TREATMENT  °Treatment will depend on the severity of your constipation and what is causing it. Some dietary treatments include drinking more fluids and eating more fiber-rich foods. Lifestyle treatments may include regular exercise. If these diet and lifestyle recommendations do not help, your health care  provider may recommend taking over-the-counter laxative medicines to help you have bowel movements. Prescription medicines may be prescribed if over-the-counter medicines do not work.  °HOME CARE INSTRUCTIONS  °· Eat foods that have a lot of fiber, such as fruits, vegetables, whole grains, and beans. °· Limit foods high in fat and processed sugars, such as french fries, hamburgers, cookies, candies, and soda.   °· A fiber supplement may be added to your diet if you cannot get enough fiber from foods.   °· Drink enough fluids to keep your urine clear or pale yellow.   °· Exercise regularly or as directed by your health care provider.   °· Go to the restroom when you have the urge to go. Do not hold it.   °· Only take over-the-counter or prescription medicines as directed by your health care provider. Do not take other medicines for constipation without talking to your health care provider first.   °SEEK IMMEDIATE MEDICAL CARE IF:  °· You have bright red blood in your stool.   °· Your constipation lasts for more than 4 days or gets worse.   °· You have abdominal or rectal pain.   °· You have thin, pencil-like stools.   °· You have unexplained weight loss. °MAKE SURE YOU:  °· Understand these instructions. °· Will watch your condition. °· Will get help right away if you are not doing well or get worse. °Document Released: 02/10/2004 Document Revised: 05/19/2013 Document Reviewed: 02/23/2013 °[employer]® Patient Information ©2015 [employer]. This information is not intended to replace advice given to you by your health care provider. Make sure you discuss any questions   you have with your health care provider. ° °Fecal Impaction °A fecal impaction happens when there is a large, firm amount of stool (or feces) that cannot be passed. The impacted stool is usually in the rectum, which is the lowest part of the large bowel. The impacted stool can block the colon and cause significant problems. °CAUSES  °The longer stool  stays in the rectum, the harder it gets. Anything that slows down your bowel movements can lead to fecal impaction, such as: °· Constipation. This can be a long-standing (chronic) problem or can happen suddenly (acute). °· Painful conditions of the rectum, such as hemorrhoids or anal fissures. The pain of these conditions can make you try to avoid having bowel movements. °· Narcotic pain-relieving medicines, such as methadone, morphine, or codeine. °· Not drinking enough fluids. °· Inactivity and bed rest over long periods of time. °· Diseases of the brain or nervous system that damage the nerves controlling the muscles of the intestines. °SIGNS AND SYMPTOMS  °· Lack of normal bowel movements or changes in bowel patterns. °· Sense of fullness in the rectum but unable to pass stool. °· Pain or cramps in the abdominal area (often after meals). °· Thin, watery discharge from the rectum. °DIAGNOSIS  °Your health care provider may suspect that you have a fecal impaction based on your symptoms and a physical exam. This will include an exam of your rectum. Sometimes X-rays or lab testing may be needed to confirm the diagnosis and to be sure there are no other problems.  °TREATMENT  °· Initially an impaction can be removed manually. Using a gloved finger, your health care provider can remove hard stool from your rectum. °· Medicine is sometimes needed. A suppository or enema can be given in the rectum to soften the stool, which can stimulate a bowel movement. Medicines can also be given by mouth (orally). °· Though rare, surgery may be needed if the colon has torn (perforated) due to blockage. °HOME CARE INSTRUCTIONS  °· Develop regular bowel habits. This could include getting in the habit of having a bowel movement after your morning cup of coffee or after eating. Be sure to allow yourself enough time on the toilet. °· Maintain a high-fiber diet. °· Drink enough fluids to keep your urine clear or pale yellow as directed by  your health care provider. °· Exercise regularly. °· If you begin to get constipated, increase the amount of fiber in your diet. Eat plenty of fruits, vegetables, whole wheat breads, bran, oatmeal, and similar products. °· Take natural fiber laxatives or other laxatives only as directed by your health care provider. °SEEK MEDICAL CARE IF:  °· You have ongoing rectal pain. °· You require enemas or suppositories more than twice a week. °· You have rectal bleeding. °· You have continued problems, or you develop abdominal pain. °· You have thin, pencil-like stools. °SEEK IMMEDIATE MEDICAL CARE IF:  °You have black or tarry stools. °MAKE SURE YOU:  °· Understand these instructions. °· Will watch your condition. °· Will get help right away if you are not doing well or get worse. °Document Released: 02/04/2004 Document Revised: 03/04/2013 Document Reviewed: 11/18/2012 °[employer]® Patient Information ©2015 [employer]. This information is not intended to replace advice given to you by your health care provider. Make sure you discuss any questions you have with your health care provider. ° °

## 2014-11-10 NOTE — Telephone Encounter (Signed)
Joyce Gross with [facility] called and stated that they need an written order for patient to receive a wheelchair and 3n1 commode. Rx written and given to Dr. Montez Morita to review and sign

## 2014-11-10 NOTE — ED Provider Notes (Signed)
Patient CT scan is concerning for developing fecal impaction. I discussed need for repeat rectal exam and possible disimpaction but patient refuses. She does not want to go through disimpaction again. She is willing to try an enema.  Minimal to no output after the enema. Patient is still refusing repeat rectal exam and disimpaction. She would like to try laxatives and stool softeners at home. I discussed this probably will not work and the consequences of worsening fecal impaction. She understands this wants to still go home. Discussed strict return precautions.   Results for orders placed or performed during the hospital encounter of 11/10/14  CBC with Differential  Result Value Ref Range   WBC 8.7 4.0 - 10.5 K/uL   RBC 4.18 3.87 - 5.11 MIL/uL   Hemoglobin 10.7 (L) 12.0 - 15.0 g/dL   HCT 16.1 (L) 09.6 - 04.5 %   MCV 80.9 78.0 - 100.0 fL   MCH 25.6 (L) 26.0 - 34.0 pg   MCHC 31.7 30.0 - 36.0 g/dL   RDW 40.9 81.1 - 91.4 %   Platelets 409 (H) 150 - 400 K/uL   Neutrophils Relative % 55 43 - 77 %   Neutro Abs 4.8 1.7 - 7.7 K/uL   Lymphocytes Relative 36 12 - 46 %   Lymphs Abs 3.1 0.7 - 4.0 K/uL   Monocytes Relative 7 3 - 12 %   Monocytes Absolute 0.6 0.1 - 1.0 K/uL   Eosinophils Relative 2 0 - 5 %   Eosinophils Absolute 0.1 0.0 - 0.7 K/uL   Basophils Relative 0 0 - 1 %   Basophils Absolute 0.0 0.0 - 0.1 K/uL  Comprehensive metabolic panel  Result Value Ref Range   Sodium 139 135 - 145 mmol/L   Potassium 3.4 (L) 3.5 - 5.1 mmol/L   Chloride 103 101 - 111 mmol/L   CO2 28 22 - 32 mmol/L   Glucose, Bld 75 65 - 99 mg/dL   BUN 8 6 - 20 mg/dL   Creatinine, Ser 7.82 0.44 - 1.00 mg/dL   Calcium 9.0 8.9 - 95.6 mg/dL   Total Protein 6.6 6.5 - 8.1 g/dL   Albumin 2.9 (L) 3.5 - 5.0 g/dL   AST 16 15 - 41 U/L   ALT 11 (L) 14 - 54 U/L   Alkaline Phosphatase 73 38 - 126 U/L   Total Bilirubin 0.7 0.3 - 1.2 mg/dL   GFR calc non Af Amer >60 >60 mL/min   GFR calc Af Amer >60 >60 mL/min   Anion gap 8  5 - 15  Urinalysis, Routine w reflex microscopic (not at [facility])  Result Value Ref Range   Color, Urine AMBER (A) YELLOW   APPearance CLOUDY (A) CLEAR   Specific Gravity, Urine 1.021 1.005 - 1.030   pH 5.0 5.0 - 8.0   Glucose, UA NEGATIVE NEGATIVE mg/dL   Hgb urine dipstick NEGATIVE NEGATIVE   Bilirubin Urine SMALL (A) NEGATIVE   Ketones, ur NEGATIVE NEGATIVE mg/dL   Protein, ur 30 (A) NEGATIVE mg/dL   Urobilinogen, UA 1.0 0.0 - 1.0 mg/dL   Nitrite NEGATIVE NEGATIVE   Leukocytes, UA SMALL (A) NEGATIVE  Urine microscopic-add on  Result Value Ref Range   Squamous Epithelial / LPF MANY (A) RARE   WBC, UA 11-20 <3 WBC/hpf   RBC / HPF 0-2 <3 RBC/hpf   Bacteria, UA MANY (A) RARE   Ct Abdomen Pelvis W Contrast  11/10/2014   CLINICAL DATA:  81 year old female with abdominal pain.  EXAM:  CT ABDOMEN AND PELVIS WITH CONTRAST  TECHNIQUE: Multidetector CT imaging of the abdomen and pelvis was performed using the standard protocol following bolus administration of intravenous contrast.  CONTRAST:  OMNIPAQUE IOHEXOL 300 MG/ML  SOLN  COMPARISON:  04/07/2014  FINDINGS: BODY WALL: Small fatty umbilical hernia.  LOWER CHEST: Mild atelectasis or scarring in the left lower lobe. No acute findings  ABDOMEN/PELVIS:  Liver: No focal abnormality.  Biliary: Cholecystectomy.  No bile duct enlargement.  Pancreas: Unremarkable.  Spleen: Unremarkable.  Adrenals: Unremarkable.  Kidneys and ureters: Stable pelviectasis bilaterally. No obstructive process or stone. Small caliceal diverticulum in the upper right kidney.  Bladder: Unremarkable.  Reproductive: No pathologic findings.  Bowel: Prominent stool volume, especially in the rectum where there is mild mesial rectal fat inflammation, likely pressure related. Diffuse noninflamed colonic diverticulosis. No bowel obstruction or appendicitis.  Retroperitoneum: No mass or adenopathy.  Peritoneum: No ascites or pneumoperitoneum.  Vascular: No acute abnormality.  OSSEOUS:  Advanced and diffuse degenerative disc narrowing and endplate irregularity. No acute osseous findings.  IMPRESSION: 1. Large stool volume with developing rectal impaction. 2. Multiple chronic/ incidental findings are stable from 2015 and noted above.   Electronically Signed   By: Marnee Spring M.D.   On: 11/10/2014 08:32      Pricilla Loveless, MD 11/10/14 1118

## 2014-11-10 NOTE — ED Notes (Signed)
Pt to ED via [facility] c/o abdominal pain; was discharged on 6/13 for similar complaints and was disimpacted. Pt denies nausea at this time

## 2014-11-11 LAB — URINE CULTURE

## 2014-11-15 ENCOUNTER — Telehealth: Payer: Self-pay | Admitting: *Deleted

## 2014-11-15 NOTE — Telephone Encounter (Signed)
Late Documentation- Mckenzie Williamson received a verbal from [place] for OT for this patient(assessement)

## 2014-11-16 DIAGNOSIS — M6281 Muscle weakness (generalized): Secondary | ICD-10-CM | POA: Diagnosis not present

## 2014-11-16 DIAGNOSIS — E119 Type 2 diabetes mellitus without complications: Secondary | ICD-10-CM | POA: Diagnosis not present

## 2014-11-16 DIAGNOSIS — K5669 Other intestinal obstruction: Secondary | ICD-10-CM | POA: Diagnosis not present

## 2014-11-16 DIAGNOSIS — I10 Essential (primary) hypertension: Secondary | ICD-10-CM | POA: Diagnosis not present

## 2014-11-16 DIAGNOSIS — I509 Heart failure, unspecified: Secondary | ICD-10-CM | POA: Diagnosis not present

## 2014-11-17 ENCOUNTER — Telehealth: Payer: Self-pay | Admitting: *Deleted

## 2014-11-17 NOTE — Telephone Encounter (Signed)
Received a phone call regarding the need for a order for a light weight wheel-chair with feet rest. I told her that I would fax the order once received.

## 2014-11-25 ENCOUNTER — Telehealth: Payer: Self-pay | Admitting: *Deleted

## 2014-11-25 NOTE — Telephone Encounter (Signed)
Rosanne AshingJim with [facility] called and patient Has a Product managerrollater and the wheel is coming off. He is requesting a wheelchair and a new Rollator. He will fax us an order for this for us to write a Rx and fax to [facility].

## 2014-12-01 ENCOUNTER — Ambulatory Visit: Payer: Medicare Other | Admitting: Internal Medicine

## 2014-12-08 ENCOUNTER — Ambulatory Visit: Payer: Medicare Other | Admitting: Cardiovascular Disease

## 2014-12-08 ENCOUNTER — Ambulatory Visit: Payer: Medicare Other | Admitting: Internal Medicine

## 2014-12-08 ENCOUNTER — Encounter: Payer: Self-pay | Admitting: Nurse Practitioner

## 2014-12-14 ENCOUNTER — Ambulatory Visit (INDEPENDENT_AMBULATORY_CARE_PROVIDER_SITE_OTHER): Payer: Medicare Other | Admitting: Nurse Practitioner

## 2014-12-14 ENCOUNTER — Encounter: Payer: Self-pay | Admitting: Nurse Practitioner

## 2014-12-14 VITALS — BP 150/80 | HR 97 | Temp 98.2°F | Resp 20 | Ht 60.0 in | Wt 130.0 lb

## 2014-12-14 DIAGNOSIS — I10 Essential (primary) hypertension: Secondary | ICD-10-CM | POA: Diagnosis not present

## 2014-12-14 DIAGNOSIS — I509 Heart failure, unspecified: Secondary | ICD-10-CM

## 2014-12-14 DIAGNOSIS — E119 Type 2 diabetes mellitus without complications: Secondary | ICD-10-CM

## 2014-12-14 DIAGNOSIS — R5381 Other malaise: Secondary | ICD-10-CM

## 2014-12-14 DIAGNOSIS — K5901 Slow transit constipation: Secondary | ICD-10-CM | POA: Diagnosis not present

## 2014-12-14 MED ORDER — FUROSEMIDE 20 MG PO TABS: 20.0000 mg | ORAL_TABLET | Freq: Every day | ORAL | Status: DC

## 2014-12-14 NOTE — Progress Notes (Signed)
Patient ID: Mckenzie Williamson, female   DOB: [birth year], 82 y.o.   MRN: [id number]    PCP: Lauree Chandler, NP  No Known Allergies  Chief Complaint  Patient presents with  . Medical Management of Chronic Issues    6 week follow-up, would like a stool softner, because she  was seen on 11/08/2014 for impaction     HPI: Patient is a 81 y.o. female seen in the office today for routine follow up. Pt with a pmh of DM, hypertension, hyperlipidemia, CHF. Pt ordinally scheduled with Dr Eulas Post but was a no show, since last visit has been seen twice in the ED due to constipation and impaction.  Has increased fluid intake. Unsure of what medication she is supposed to be taking. Neighbor and son here with her.  Had been taking colace twice daily and miralax twice daily   Eating better at home. Good appetite.   Eating a lot of canned food and freezer meals.  Had follow up with cardiology-had to cancel appt due to transportation issue Increased edema to LE. No shortness of breath or cough.   Reports blood sugars are fair- does not have blood glucose meter. Does not know any readings, reports they are "fair"  Pt has been followed by home health and received multiple DME.   Review of Systems:  Review of Systems  Constitutional: Negative for appetite change, fatigue and unexpected weight change.  Respiratory: Negative for shortness of breath.   Cardiovascular: Positive for leg swelling. Negative for chest pain and palpitations.  Gastrointestinal: Positive for constipation. Negative for diarrhea.  Genitourinary: Negative for dysuria, frequency and difficulty urinating.       Incontinence of urine   Musculoskeletal: Negative for myalgias and arthralgias.  Neurological: Positive for weakness.  Psychiatric/Behavioral: Positive for confusion. The patient is nervous/anxious.     Past Medical History  Diagnosis Date  . Coronary artery disease     s/p CABG in 2005; last cath in 2008 showing  occlusion of all SVG's;   . Diabetes mellitus   . Hyperlipidemia   . Hypertension   . LV dysfunction     EF 30 to 35% per echo 3/13  . Dizzy   . Hypoglycemia   . Cataract   . CHF (congestive heart failure)   . Non compliance w medication regimen   . Ischemic cardiomyopathy   . Memory difficulty    Past Surgical History  Procedure Laterality Date  . Coronary artery bypass graft  2005    THIS INCLUDED SAPHENOUS VEIN GRAFT TO THE PDA, SAPHENOUS VEIN GRAFT TO OBTUSE MARGINAL VESSEL, AS LIMA GRAFT TO THE LAD.  Marland Kitchen Cardiac catheterization  2008    EF 50-55%. SHOWED OCCLUSION OF VEIN GRAFT  . Cholecystectomy    . Transthoracic echocardiogram  05/04/2004    EF 50-55%  . Cardiovascular stress test  12/24/2006    EF 47%   Social History:   reports that she has never smoked. She has never used smokeless tobacco. She reports that she does not drink alcohol or use illicit drugs.  Family History  Problem Relation Age of Onset  . Tuberculosis Mother   . Lung cancer Father   . Diabetes Sister   . Heart attack Brother   . Hypertension Brother   . Diabetes Brother     Medications: Patient's Medications  New Prescriptions   No medications on file  Previous Medications   AMBULATORY NON FORMULARY MEDICATION    1.Wheelchair with feet rest 2.3N1  commode Dx:R41.3,I50,E11.9   ASPIRIN (EQ ASPIRIN) 325 MG EC TABLET    Take 1 tablet (325 mg total) by mouth daily.   BLOOD GLUCOSE MONITORING SUPPL (ONE TOUCH ULTRA SYSTEM KIT) W/DEVICE KIT    1 kit by Does not apply route once. Check blood sugar once daily as directed DX E10.69   DOCUSATE SODIUM (COLACE) 100 MG CAPSULE    Take 1 capsule (100 mg total) by mouth every 12 (twelve) hours.   GLUCOSE BLOOD (ONE TOUCH ULTRA TEST) TEST STRIP    Check blood sugar once daily as directed DX 250.00   INSULIN GLARGINE (LANTUS SOLOSTAR) 100 UNIT/ML SOLOSTAR PEN    Inject 10 Units into the skin daily at 10 pm.   INSULIN PEN NEEDLE 31G X 5 MM MISC    Use daily as  directed with insulin. DX 250.00   LISINOPRIL (PRINIVIL,ZESTRIL) 10 MG TABLET    Take 1 tablet (10 mg total) by mouth daily.   METFORMIN (GLUCOPHAGE) 500 MG TABLET    Take 1 tablet (500 mg total) by mouth daily with breakfast.   MULTIPLE VITAMIN (MULTIVITAMIN) TABLET    Take 1 tablet by mouth daily.     ONETOUCH DELICA LANCETS FINE MISC    Check blood sugar once daily as directed Dx E10.69   POLYETHYLENE GLYCOL (MIRALAX) PACKET    Take 17 g by mouth 2 (two) times daily.   PRAVASTATIN (PRAVACHOL) 40 MG TABLET    Take 1 tablet (40 mg total) by mouth daily.  Modified Medications   No medications on file  Discontinued Medications   No medications on file     Physical Exam:  Filed Vitals:   12/14/14 1532  BP: 150/80  Pulse: 97  Temp: 98.2 F (36.8 C)  TempSrc: Oral  Resp: 20  Height: 5' (1.524 m)  SpO2: 97%    Physical Exam  Constitutional: She appears well-developed and well-nourished.  HENT:  Mouth/Throat: Oropharynx is clear and moist. No oropharyngeal exudate.  Eyes: Conjunctivae and EOM are normal. Pupils are equal, round, and reactive to light.  Neck: Normal range of motion. Neck supple.  Cardiovascular: Normal rate, regular rhythm and normal heart sounds.   Pulmonary/Chest: Effort normal and breath sounds normal.  Abdominal: Soft. Bowel sounds are normal.  Musculoskeletal: She exhibits edema (2+ bilaterally).  Neurological: She is alert.  Skin: Skin is warm and dry.  Psychiatric: Her affect is blunt. Cognition and memory are impaired.    Labs reviewed: Basic Metabolic Panel:  Recent Labs  04/07/14 1145 10/05/14 1131 11/10/14 0615  NA 136* 139 139  K 5.3 4.3 3.4*  CL 99 102 103  CO2 '23 19 28  ' GLUCOSE 346* 306* 75  BUN '6 20 8  ' CREATININE 0.59 0.92 0.77  CALCIUM 9.4 9.4 9.0  TSH  --  1.500  --    Liver Function Tests:  Recent Labs  04/06/14 0830 04/07/14 1145 10/05/14 1131 11/10/14 0615  AST '6 22 6 16  ' ALT '7 10 8 ' 11*  ALKPHOS 124* 108 104 73    BILITOT 0.5 0.5 0.2 0.7  PROT 6.6 6.9 7.1 6.6  ALBUMIN 3.0* 3.2*  --  2.9*    Recent Labs  04/06/14 0830 04/07/14 1145  LIPASE 59 35   No results for input(s): AMMONIA in the last 8760 hours. CBC:  Recent Labs  04/06/14 0830 04/07/14 1348 10/05/14 1131 10/12/14 1053 11/10/14 0615  WBC 4.1 5.0 11.4* 8.6 8.7  NEUTROABS 2.0 2.9 9.5* 6.3 4.8  HGB  12.4 12.4  --   --  10.7*  HCT 36.9 38.1 33.6* 35.1 33.8*  MCV 83.7 84.9  --   --  80.9  PLT PLATELET CLUMPS NOTED ON SMEAR, UNABLE TO ESTIMATE 250  --   --  409*   Lipid Panel: No results for input(s): CHOL, HDL, LDLCALC, TRIG, CHOLHDL, LDLDIRECT in the last 8760 hours. TSH:  Recent Labs  10/05/14 1131  TSH 1.500   A1C: Lab Results  Component Value Date   HGBA1C 8.2* 10/05/2014     Assessment/Plan 1. Slow transit constipation Having loose stool with miralax and colace Take colace 100 mg twice daily Use miralax daily as needed only for constipation Increase fluid intake   2. Essential hypertension -not at goal, discussed dietary modifications -will add lasix 20 mg daily to help with blood pressure and edema  - furosemide (LASIX) 20 MG tablet; Take 1 tablet (20 mg total) by mouth daily.  Dispense: 30 tablet; Refill: 3 - Basic metabolic panel  3. Chronic congestive heart failure, unspecified congestive heart failure type -stable at this time, will add lasix due to increased edema -to reschedule follow up with cardiology  - furosemide (LASIX) 20 MG tablet; Take 1 tablet (20 mg total) by mouth daily.  Dispense: 30 tablet; Refill: 3  4. Debility -unchanged, however working with home health therapy.   5. Type 2 diabetes mellitus not at goal -does not have blood sugars today, education on the need to bring blood sugars reading to next visit  To follow up in 2 weeks with Dr Thayer Headings K. Harle Battiest  [facility] [phone] 8 am - 5 pm) [phone] (after  hours)

## 2014-12-14 NOTE — Patient Instructions (Addendum)
Blood pressure elevated- will start lasix 20 mg daily for swelling and blood pressure  Make follow up appt with Cardiology Make appt with eye doctor [facility] ? Address: [address], [place], [place] [address] Phone: [phone]  For constipation Take colace 100 mg twice daily Use miralax daily as needed for constipation Increase fluid intake   Follow up in 4 weeks with Dr Montez Moritaarter in regards to constipation, diabetes, blood pressure

## 2014-12-15 ENCOUNTER — Other Ambulatory Visit: Payer: Self-pay | Admitting: Nurse Practitioner

## 2014-12-15 LAB — BASIC METABOLIC PANEL
BUN/Creatinine Ratio: 18 (ref 11–26)
BUN: 13 mg/dL (ref 8–27)
CALCIUM: 9.4 mg/dL (ref 8.7–10.3)
CHLORIDE: 101 mmol/L (ref 97–108)
CO2: 26 mmol/L (ref 18–29)
CREATININE: 0.71 mg/dL (ref 0.57–1.00)
GFR calc non Af Amer: 81 mL/min/{1.73_m2} (ref >59)
GFR, EST AFRICAN AMERICAN: 93 mL/min/{1.73_m2} (ref >59)
Glucose: 152 mg/dL — ABNORMAL HIGH (ref 65–99)
Potassium: 2.8 mmol/L — ABNORMAL LOW (ref 3.5–5.2)
Sodium: 146 mmol/L — ABNORMAL HIGH (ref 134–144)

## 2014-12-15 MED ORDER — POTASSIUM CHLORIDE CRYS ER 20 MEQ PO TBCR: EXTENDED_RELEASE_TABLET | ORAL | Status: DC

## 2014-12-18 ENCOUNTER — Inpatient Hospital Stay (HOSPITAL_COMMUNITY)
Admission: EM | Admit: 2014-12-18 | Discharge: 2014-12-24 | DRG: 391 | Disposition: A | Discharge status: Skilled Nursing Facility | Payer: Medicare Other | Attending: Internal Medicine | Admitting: Internal Medicine

## 2014-12-18 DIAGNOSIS — Z8249 Family history of ischemic heart disease and other diseases of the circulatory system: Secondary | ICD-10-CM

## 2014-12-18 DIAGNOSIS — E119 Type 2 diabetes mellitus without complications: Secondary | ICD-10-CM | POA: Diagnosis not present

## 2014-12-18 DIAGNOSIS — I2510 Atherosclerotic heart disease of native coronary artery without angina pectoris: Secondary | ICD-10-CM | POA: Diagnosis present

## 2014-12-18 DIAGNOSIS — R0789 Other chest pain: Secondary | ICD-10-CM | POA: Diagnosis not present

## 2014-12-18 DIAGNOSIS — I5023 Acute on chronic systolic (congestive) heart failure: Secondary | ICD-10-CM | POA: Diagnosis present

## 2014-12-18 DIAGNOSIS — Z833 Family history of diabetes mellitus: Secondary | ICD-10-CM

## 2014-12-18 DIAGNOSIS — I5021 Acute systolic (congestive) heart failure: Secondary | ICD-10-CM

## 2014-12-18 DIAGNOSIS — E785 Hyperlipidemia, unspecified: Secondary | ICD-10-CM | POA: Diagnosis present

## 2014-12-18 DIAGNOSIS — I5022 Chronic systolic (congestive) heart failure: Secondary | ICD-10-CM | POA: Diagnosis not present

## 2014-12-18 DIAGNOSIS — I10 Essential (primary) hypertension: Secondary | ICD-10-CM | POA: Diagnosis present

## 2014-12-18 DIAGNOSIS — K219 Gastro-esophageal reflux disease without esophagitis: Secondary | ICD-10-CM | POA: Diagnosis not present

## 2014-12-18 DIAGNOSIS — I255 Ischemic cardiomyopathy: Secondary | ICD-10-CM | POA: Diagnosis present

## 2014-12-18 DIAGNOSIS — I509 Heart failure, unspecified: Secondary | ICD-10-CM

## 2014-12-18 DIAGNOSIS — Z794 Long term (current) use of insulin: Secondary | ICD-10-CM

## 2014-12-18 DIAGNOSIS — R079 Chest pain, unspecified: Secondary | ICD-10-CM | POA: Diagnosis not present

## 2014-12-18 DIAGNOSIS — Z7982 Long term (current) use of aspirin: Secondary | ICD-10-CM

## 2014-12-18 DIAGNOSIS — T501X5A Adverse effect of loop [high-ceiling] diuretics, initial encounter: Secondary | ICD-10-CM | POA: Diagnosis not present

## 2014-12-18 DIAGNOSIS — Z951 Presence of aortocoronary bypass graft: Secondary | ICD-10-CM

## 2014-12-18 DIAGNOSIS — E876 Hypokalemia: Secondary | ICD-10-CM

## 2014-12-18 DIAGNOSIS — Z9114 Patient's other noncompliance with medication regimen: Secondary | ICD-10-CM | POA: Diagnosis present

## 2014-12-18 DIAGNOSIS — M25559 Pain in unspecified hip: Secondary | ICD-10-CM

## 2014-12-18 DIAGNOSIS — M25569 Pain in unspecified knee: Secondary | ICD-10-CM

## 2014-12-18 DIAGNOSIS — M25551 Pain in right hip: Secondary | ICD-10-CM | POA: Diagnosis present

## 2014-12-18 NOTE — ED Notes (Signed)
Per EMS, pt was laying on couch when she had a sudden onset of chest pain, which is worse with movement and palpation. Pt has left arm and bilateral lower leg edema, which the pt has recently started lasix to treat. CBG- 155

## 2014-12-19 ENCOUNTER — Emergency Department (HOSPITAL_COMMUNITY): Payer: Medicare Other

## 2014-12-19 ENCOUNTER — Encounter (HOSPITAL_COMMUNITY): Payer: Self-pay | Admitting: Emergency Medicine

## 2014-12-19 DIAGNOSIS — I5021 Acute systolic (congestive) heart failure: Secondary | ICD-10-CM | POA: Diagnosis not present

## 2014-12-19 DIAGNOSIS — I5022 Chronic systolic (congestive) heart failure: Secondary | ICD-10-CM

## 2014-12-19 DIAGNOSIS — E876 Hypokalemia: Secondary | ICD-10-CM

## 2014-12-19 DIAGNOSIS — K219 Gastro-esophageal reflux disease without esophagitis: Secondary | ICD-10-CM | POA: Diagnosis not present

## 2014-12-19 DIAGNOSIS — E119 Type 2 diabetes mellitus without complications: Secondary | ICD-10-CM

## 2014-12-19 DIAGNOSIS — R079 Chest pain, unspecified: Secondary | ICD-10-CM | POA: Diagnosis present

## 2014-12-19 DIAGNOSIS — R0789 Other chest pain: Secondary | ICD-10-CM | POA: Diagnosis not present

## 2014-12-19 DIAGNOSIS — I10 Essential (primary) hypertension: Secondary | ICD-10-CM | POA: Diagnosis not present

## 2014-12-19 DIAGNOSIS — I5023 Acute on chronic systolic (congestive) heart failure: Secondary | ICD-10-CM | POA: Diagnosis not present

## 2014-12-19 LAB — BASIC METABOLIC PANEL
Anion gap: 11 (ref 5–15)
Anion gap: 8 (ref 5–15)
BUN: 11 mg/dL (ref 6–20)
BUN: 9 mg/dL (ref 6–20)
CALCIUM: 8.7 mg/dL — AB (ref 8.9–10.3)
CHLORIDE: 103 mmol/L (ref 101–111)
CO2: 29 mmol/L (ref 22–32)
CO2: 30 mmol/L (ref 22–32)
CREATININE: 0.7 mg/dL (ref 0.44–1.00)
CREATININE: 0.73 mg/dL (ref 0.44–1.00)
Calcium: 8.5 mg/dL — ABNORMAL LOW (ref 8.9–10.3)
Chloride: 102 mmol/L (ref 101–111)
GFR calc Af Amer: >60 mL/min (ref >60)
GFR calc Af Amer: >60 mL/min (ref >60)
GFR calc non Af Amer: >60 mL/min (ref >60)
GLUCOSE: 134 mg/dL — AB (ref 65–99)
Glucose, Bld: 244 mg/dL — ABNORMAL HIGH (ref 65–99)
Potassium: 2.6 mmol/L — CL (ref 3.5–5.1)
Potassium: 3.5 mmol/L (ref 3.5–5.1)
SODIUM: 142 mmol/L (ref 135–145)
Sodium: 141 mmol/L (ref 135–145)

## 2014-12-19 LAB — CBC WITH DIFFERENTIAL/PLATELET
Basophils Absolute: 0 10*3/uL (ref 0.0–0.1)
Basophils Relative: 0 % (ref 0–1)
Eosinophils Absolute: 0.1 10*3/uL (ref 0.0–0.7)
Eosinophils Relative: 2 % (ref 0–5)
HCT: 32.7 % — ABNORMAL LOW (ref 36.0–46.0)
HEMOGLOBIN: 10.5 g/dL — AB (ref 12.0–15.0)
LYMPHS ABS: 3.3 10*3/uL (ref 0.7–4.0)
LYMPHS PCT: 37 % (ref 12–46)
MCH: 26.1 pg (ref 26.0–34.0)
MCHC: 32.1 g/dL (ref 30.0–36.0)
MCV: 81.1 fL (ref 78.0–100.0)
MONO ABS: 0.5 10*3/uL (ref 0.1–1.0)
Monocytes Relative: 5 % (ref 3–12)
Neutro Abs: 4.9 10*3/uL (ref 1.7–7.7)
Neutrophils Relative %: 56 % (ref 43–77)
Platelets: 266 10*3/uL (ref 150–400)
RBC: 4.03 MIL/uL (ref 3.87–5.11)
RDW: 15.4 % (ref 11.5–15.5)
WBC: 8.8 10*3/uL (ref 4.0–10.5)

## 2014-12-19 LAB — GLUCOSE, CAPILLARY
GLUCOSE-CAPILLARY: 219 mg/dL — AB (ref 65–99)
GLUCOSE-CAPILLARY: 138 mg/dL — AB (ref 65–99)
Glucose-Capillary: 136 mg/dL — ABNORMAL HIGH (ref 65–99)
Glucose-Capillary: 141 mg/dL — ABNORMAL HIGH (ref 65–99)

## 2014-12-19 LAB — I-STAT TROPONIN, ED: TROPONIN I, POC: 0.04 ng/mL (ref 0.00–0.08)

## 2014-12-19 LAB — TROPONIN I
Troponin I: 0.06 ng/mL — ABNORMAL HIGH (ref <0.031)
Troponin I: 0.06 ng/mL — ABNORMAL HIGH (ref <0.031)

## 2014-12-19 LAB — BRAIN NATRIURETIC PEPTIDE: B NATRIURETIC PEPTIDE 5: 210.2 pg/mL — AB (ref 0.0–100.0)

## 2014-12-19 MED ORDER — FUROSEMIDE 10 MG/ML IJ SOLN
40.0000 mg | Freq: Two times a day (BID) | INTRAMUSCULAR | Status: DC
  Administered 2014-12-19 – 2014-12-20 (×4): 40 mg via INTRAVENOUS
  Filled 2014-12-19 (×5): qty 4

## 2014-12-19 MED ORDER — ASPIRIN EC 325 MG PO TBEC
325.0000 mg | DELAYED_RELEASE_TABLET | Freq: Every day | ORAL | Status: DC
  Administered 2014-12-19 – 2014-12-24 (×6): 325 mg via ORAL
  Filled 2014-12-19 (×6): qty 1

## 2014-12-19 MED ORDER — INSULIN ASPART 100 UNIT/ML ~~LOC~~ SOLN
0.0000 [IU] | Freq: Three times a day (TID) | SUBCUTANEOUS | Status: DC
  Administered 2014-12-19 (×2): 2 [IU] via SUBCUTANEOUS
  Administered 2014-12-20 – 2014-12-21 (×2): 3 [IU] via SUBCUTANEOUS
  Administered 2014-12-21 – 2014-12-22 (×2): 2 [IU] via SUBCUTANEOUS
  Administered 2014-12-22 – 2014-12-23 (×4): 3 [IU] via SUBCUTANEOUS
  Administered 2014-12-23: 4 [IU] via SUBCUTANEOUS
  Administered 2014-12-24: 3 [IU] via SUBCUTANEOUS
  Administered 2014-12-24: 5 [IU] via SUBCUTANEOUS

## 2014-12-19 MED ORDER — POTASSIUM CHLORIDE 10 MEQ/100ML IV SOLN
10.0000 meq | INTRAVENOUS | Status: DC
  Filled 2014-12-19: qty 100

## 2014-12-19 MED ORDER — LISINOPRIL 10 MG PO TABS
10.0000 mg | ORAL_TABLET | Freq: Every day | ORAL | Status: DC
  Administered 2014-12-19 – 2014-12-20 (×2): 10 mg via ORAL
  Filled 2014-12-19 (×3): qty 1

## 2014-12-19 MED ORDER — POTASSIUM CHLORIDE 20 MEQ/15ML (10%) PO SOLN
40.0000 meq | Freq: Once | ORAL | Status: AC
  Administered 2014-12-19: 40 meq via ORAL
  Filled 2014-12-19: qty 30

## 2014-12-19 MED ORDER — INSULIN ASPART 100 UNIT/ML ~~LOC~~ SOLN
0.0000 [IU] | Freq: Three times a day (TID) | SUBCUTANEOUS | Status: DC
  Administered 2014-12-19: 3 [IU] via SUBCUTANEOUS

## 2014-12-19 MED ORDER — INSULIN GLARGINE 100 UNIT/ML ~~LOC~~ SOLN
10.0000 [IU] | Freq: Every day | SUBCUTANEOUS | Status: DC
  Administered 2014-12-19 – 2014-12-23 (×4): 10 [IU] via SUBCUTANEOUS
  Filled 2014-12-19 (×6): qty 0.1

## 2014-12-19 MED ORDER — PANTOPRAZOLE SODIUM 40 MG PO TBEC
40.0000 mg | DELAYED_RELEASE_TABLET | Freq: Two times a day (BID) | ORAL | Status: DC
  Administered 2014-12-19 – 2014-12-20 (×4): 40 mg via ORAL
  Filled 2014-12-19 (×3): qty 1

## 2014-12-19 MED ORDER — INSULIN ASPART 100 UNIT/ML ~~LOC~~ SOLN: 0.0000 [IU] | Freq: Every day | SUBCUTANEOUS | Status: DC

## 2014-12-19 MED ORDER — POTASSIUM CHLORIDE CRYS ER 20 MEQ PO TBCR
40.0000 meq | EXTENDED_RELEASE_TABLET | Freq: Two times a day (BID) | ORAL | Status: AC
  Administered 2014-12-19 (×2): 40 meq via ORAL
  Filled 2014-12-19 (×2): qty 2

## 2014-12-19 MED ORDER — ENOXAPARIN SODIUM 30 MG/0.3ML ~~LOC~~ SOLN
30.0000 mg | SUBCUTANEOUS | Status: DC
  Administered 2014-12-19 – 2014-12-20 (×2): 30 mg via SUBCUTANEOUS
  Filled 2014-12-19 (×2): qty 0.3

## 2014-12-19 MED ORDER — PRAVASTATIN SODIUM 40 MG PO TABS
40.0000 mg | ORAL_TABLET | Freq: Every day | ORAL | Status: DC
  Administered 2014-12-19 – 2014-12-24 (×6): 40 mg via ORAL
  Filled 2014-12-19 (×6): qty 1

## 2014-12-19 MED ORDER — ACETAMINOPHEN 325 MG PO TABS
650.0000 mg | ORAL_TABLET | ORAL | Status: DC | PRN
  Administered 2014-12-23: 650 mg via ORAL
  Filled 2014-12-19: qty 2

## 2014-12-19 MED ORDER — INSULIN ASPART 100 UNIT/ML ~~LOC~~ SOLN
3.0000 [IU] | Freq: Three times a day (TID) | SUBCUTANEOUS | Status: DC
  Administered 2014-12-19 – 2014-12-23 (×8): 3 [IU] via SUBCUTANEOUS

## 2014-12-19 MED ORDER — DOCUSATE SODIUM 100 MG PO CAPS
100.0000 mg | ORAL_CAPSULE | Freq: Two times a day (BID) | ORAL | Status: DC
  Administered 2014-12-19 – 2014-12-24 (×11): 100 mg via ORAL
  Filled 2014-12-19 (×13): qty 1

## 2014-12-19 MED ORDER — POTASSIUM CHLORIDE CRYS ER 20 MEQ PO TBCR
40.0000 meq | EXTENDED_RELEASE_TABLET | Freq: Once | ORAL | Status: AC
  Administered 2014-12-19: 40 meq via ORAL
  Filled 2014-12-19: qty 2

## 2014-12-19 NOTE — H&P (Signed)
History and Physical  Jeff Mccallum YQI:[id number] DOB: 11-Feb-1933 DOA: 12/18/2014  PCP: Lauree Chandler, NP   Chief Complaint: chest pain  HPI: Shere Eisenhart is a 81 y.o. female with history of CAD s/p CABG in 2005 with LHC in 2008, DM, HLD, HTN who was brought to the ED with cc of chest pain. She said it started last night while she was watching TV laying on her couch. It's a left side chest pain, spasms like and already resolved, associated with dyspnea . She said she has had that pain for the past few years and she has had cough for the past few weeks that is not productive. No fever or chills.No N/V/D/C but she has mild abdominal discomfort. She claims to be complaint with her meds. She is a very poor historian and son at bedside could not add details to her history. She uses a wheelchair to ambulate.     Review of Systems:  A 12 point review of systems were negative except as per HPI.    Past Medical History  Diagnosis Date  . Coronary artery disease     s/p CABG in 2005; last cath in 2008 showing occlusion of all SVG's;   . Diabetes mellitus   . Hyperlipidemia   . Hypertension   . LV dysfunction     EF 30 to 35% per echo 3/13  . Dizzy   . Hypoglycemia   . Cataract   . CHF (congestive heart failure)   . Non compliance w medication regimen   . Ischemic cardiomyopathy   . Memory difficulty    Past Surgical History  Procedure Laterality Date  . Coronary artery bypass graft  2005    THIS INCLUDED SAPHENOUS VEIN GRAFT TO THE PDA, SAPHENOUS VEIN GRAFT TO OBTUSE MARGINAL VESSEL, AS LIMA GRAFT TO THE LAD.  Marland Kitchen Cardiac catheterization  2008    EF 50-55%. SHOWED OCCLUSION OF VEIN GRAFT  . Cholecystectomy    . Transthoracic echocardiogram  05/04/2004    EF 50-55%  . Cardiovascular stress test  12/24/2006    EF 47%   Social History:  reports that she has never smoked. She has never used smokeless tobacco. She reports that she does not drink alcohol or use  illicit drugs.  No Known Allergies  Family History  Problem Relation Age of Onset  . Tuberculosis Mother   . Lung cancer Father   . Diabetes Sister   . Heart attack Brother   . Hypertension Brother   . Diabetes Brother     Prior to Admission medications   Medication Sig Start Date End Date Taking? Authorizing Provider  AMBULATORY NON FORMULARY MEDICATION 1.Wheelchair with feet rest 2.3N1 commode Dx:R41.3,I50,E11.9 11/10/14   Gildardo Cranker, DO  aspirin (EQ ASPIRIN) 325 MG EC tablet Take 1 tablet (325 mg total) by mouth daily. 10/05/14   Lauree Chandler, NP  Blood Glucose Monitoring Suppl (ONE TOUCH ULTRA SYSTEM KIT) W/DEVICE KIT 1 kit by Does not apply route once. Check blood sugar once daily as directed DX E10.69 10/12/14   Lauree Chandler, NP  docusate sodium (COLACE) 100 MG capsule Take 1 capsule (100 mg total) by mouth every 12 (twelve) hours. 11/10/14   Sherwood Gambler, MD  furosemide (LASIX) 20 MG tablet Take 1 tablet (20 mg total) by mouth daily. 12/14/14   Lauree Chandler, NP  glucose blood (ONE TOUCH ULTRA TEST) test strip Check blood sugar once daily as directed DX 250.00 10/12/14   Janett Billow  Beaulah Corin, NP  Insulin Glargine (LANTUS SOLOSTAR) 100 UNIT/ML Solostar Pen Inject 10 Units into the skin daily at 10 pm. 04/06/14   Rolland Porter, MD  Insulin Pen Needle 31G X 5 MM MISC Use daily as directed with insulin. DX 250.00 04/06/14   Rolland Porter, MD  lisinopril (PRINIVIL,ZESTRIL) 10 MG tablet Take 1 tablet (10 mg total) by mouth daily. 10/19/14 10/19/15  Lauree Chandler, NP  metFORMIN (GLUCOPHAGE) 500 MG tablet Take 1 tablet (500 mg total) by mouth daily with breakfast. 10/19/14   Lauree Chandler, NP  Multiple Vitamin (MULTIVITAMIN) tablet Take 1 tablet by mouth daily.      Historical Provider, MD  [facility] DELICA LANCETS FINE MISC Check blood sugar once daily as directed Dx E10.69 10/19/14   Lauree Chandler, NP  polyethylene glycol [facility]) packet Take 17 g by mouth 2 (two) times  daily. 11/10/14   Sherwood Gambler, MD  potassium chloride SA (K-DUR,KLOR-CON) 20 MEQ tablet To take 40 meq 2 tablets twice daily for today and tomorrow then to start 40 meq daily 12/15/14   Lauree Chandler, NP  pravastatin (PRAVACHOL) 40 MG tablet Take 1 tablet (40 mg total) by mouth daily. 10/19/14   Lauree Chandler, NP    Physical Exam: BP 190/82 mmHg  Pulse 85  Resp 15  SpO2 98%  General:  In NAD Eyes: Non icteric  ENT: normal mucous membranes.  Neck: supple  Cardiovascular: RRR. No murmurs.  Respiratory: No wheezing.  Abdomen: soft, mildly tender to deep palpation.  Skin: no rash or bruises.  Musculoskeletal: no deformity.  Neurologic: alert, O#3, no focal deficits.           Labs on Admission:  Basic Metabolic Panel:  Recent Labs Lab 12/14/14 1609 12/19/14 0102  NA 146* 142  K 2.8* 2.6*  CL 101 102  CO2 26 29  GLUCOSE 152* 134*  BUN 13 11  CREATININE 0.71 0.70  CALCIUM 9.4 8.7*   Liver Function Tests: No results for input(s): AST, ALT, ALKPHOS, BILITOT, PROT, ALBUMIN in the last 168 hours. No results for input(s): LIPASE, AMYLASE in the last 168 hours. No results for input(s): AMMONIA in the last 168 hours. CBC:  Recent Labs Lab 12/19/14 0102  WBC 8.8  NEUTROABS 4.9  HGB 10.5*  HCT 32.7*  MCV 81.1  PLT 266   Cardiac Enzymes: No results for input(s): CKTOTAL, CKMB, CKMBINDEX, TROPONINI in the last 168 hours.  BNP (last 3 results) No results for input(s): BNP in the last 8760 hours.  ProBNP (last 3 results) No results for input(s): PROBNP in the last 8760 hours.  CBG: No results for input(s): GLUCAP in the last 168 hours.  Radiological Exams on Admission: Dg Chest 2 View  12/19/2014   CLINICAL DATA:  81 year old female with chest pain  EXAM: CHEST  2 VIEW  COMPARISON:  Chest radiograph dated 10/05/2011  FINDINGS: Two views of the chest demonstrate clear lungs. There is no focal consolidation, pleural effusion, or pneumothorax. Stable  cardiomegaly. Median sternotomy wires and CABG clips noted. Degenerate changes of the spine.  IMPRESSION: No active cardiopulmonary disease.  Stable cardiomegaly.   Electronically Signed   By: Anner Crete M.D.   On: 12/19/2014 01:04    EKG: Independently reviewed. No ischemic changes in ST/T.   Assessment/Plan  Chest pain:  MS vs. Cardiac vs. GI Will r/o ACS. Initial trop neg. Initial EKG w/o ischemic changes. Will check repeat trop  CAD s/p CABG: Continue asp  and statin. Echo from 2013 showing severe systolic dysfunction with EF of 30%  DM:  Continue home meds with low dose correction insulin  Hypokalemia:  Likely due to lasix; will hold it Pt refuses IV Will replace with 80 meq oral and recheck potassium  HTN:  Continue home meds  Consultants: none   Code Status: full   Family Communication: son at bedside   Disposition Plan: home once ACS r/o.    Gennaro Africa MD [facility]

## 2014-12-19 NOTE — Progress Notes (Signed)
[facility] PROGRESS NOTE  Assessment/Plan: Chest pain - Like hemoglobin is 10.5 unchanged compared to previous. - She will O2 was placed first with fluids and potassium pills. - Likely GERD, will start on Protonix twice a day for BT her stools. - Positive cardiac markers negative, 2-D echo continue cycle troponins.  Hypokalemia: - now resolved. - cont oral K.  Chronic systolic congestive heart failure, NYHA class 2 - Apply TED hose continue Lasix IV. - Restrict her fluids, daily weights monitor electrolytes.  Controlled diabetes mellitus type II without complication - Start long-acting insulin, check CBGs before meals and at bedtime to 20 kg insulin.  Essential hypertension - Elevated, continue titrating antihypertensive medications.    Code Status: full Family Communication: none  Disposition Plan: home in 2-3 days   Consultants:  none  Procedures:  ECHO  CXR  Antibiotics:  None  HPI/Subjective: Positive for PND, now chest pain free.  Objective: Filed Vitals:   12/19/14 0401 12/19/14 0445 12/19/14 0530 12/19/14 0621  BP: 190/82 173/78 152/64 151/67  Pulse: 85 69 89 96  Temp:    98.1 F (36.7 C)  TempSrc:    Oral  Resp: Height:     (1.575 m)  Weight:    65.2 kg (143 lb 11.8 oz)  SpO2: 98% 92% 100% 98%    Intake/Output Summary (Last 24 hours) at 12/19/14 1130 Last data filed at 12/19/14 1014  Gross per 24 hour  Intake    240 ml  Output    400 ml  Net   -160 ml   Filed Weights   12/19/14 0621  Weight: 65.2 kg (143 lb 11.8 oz)    Exam:  General: Alert, awake, oriented x3, in no acute distress.  HEENT: No bruits, no goiter. +JVD Heart: Regular rate and rhythm. Lungs: Good air movement, clear Abdomen: Soft, nontender, nondistended, positive bowel sounds.  Neuro: Grossly intact, nonfocal.   Data Reviewed: Basic Metabolic Panel:  Recent Labs Lab 12/14/14 1609 12/19/14 0102 12/19/14 0840  NA 146* 142 141    K 2.8* 2.6* 3.5  CL 101 102 103  CO2 GLUCOSE 152* 134* 244*  BUN CREATININE 0.71 0.70 0.73  CALCIUM 9.4 8.7* 8.5*   Liver Function Tests: No results for input(s): AST, ALT, ALKPHOS, BILITOT, PROT, ALBUMIN in the last 168 hours. No results for input(s): LIPASE, AMYLASE in the last 168 hours. No results for input(s): AMMONIA in the last 168 hours. CBC:  Recent Labs Lab 12/19/14 0102  WBC 8.8  NEUTROABS 4.9  HGB 10.5*  HCT 32.7*  MCV 81.1  PLT 266   Cardiac Enzymes: No results for input(s): CKTOTAL, CKMB, CKMBINDEX, TROPONINI in the last 168 hours. BNP (last 3 results) No results for input(s): BNP in the last 8760 hours.  ProBNP (last 3 results) No results for input(s): PROBNP in the last 8760 hours.  CBG:  Recent Labs Lab 12/19/14 0653  GLUCAP 219*    No results found for this or any previous visit (from the past 240 hour(s)).   Studies: Dg Chest 2 View  12/19/2014   CLINICAL DATA:  81 year old female with chest pain  EXAM: CHEST  2 VIEW  COMPARISON:  Chest radiograph dated 10/05/2011  FINDINGS: Two views of the chest demonstrate clear lungs. There is no focal consolidation, pleural effusion, or pneumothorax. Stable cardiomegaly. Median sternotomy wires and CABG clips noted. Degenerate changes of the spine.  IMPRESSION: No  active cardiopulmonary disease.  Stable cardiomegaly.   Electronically Signed   By: Elgie Collard M.D.   On: 12/19/2014 01:04    Scheduled Meds: . aspirin  325 mg Oral Daily  . docusate sodium  100 mg Oral Q12H  . enoxaparin (LOVENOX) injection  30 mg Subcutaneous Q24H  . furosemide  40 mg Intravenous BID  . insulin aspart  0-15 Units Subcutaneous TID WC  . insulin aspart  0-5 Units Subcutaneous QHS  . insulin aspart  3 Units Subcutaneous TID WC  . insulin glargine  10 Units Subcutaneous Q2200  . lisinopril  10 mg Oral Daily  . pantoprazole  40 mg Oral BID  . potassium chloride  40 mEq Oral BID  . pravastatin  40 mg  Oral Daily   Continuous Infusions:   Time Spent: 25 min   Marinda Elk  [facility] Pager [phone]. If 7PM-7AM, please contact night-coverage at [email or web address], password [facility] 12/19/2014, 11:30 AM

## 2014-12-19 NOTE — ED Notes (Signed)
This RN unable to gain IV access.  

## 2014-12-19 NOTE — ED Notes (Signed)
Ranae Palms, MD notified of pt's critical potassium level.

## 2014-12-19 NOTE — ED Notes (Signed)
Attempted report 

## 2014-12-19 NOTE — ED Notes (Addendum)
Hamad, MD notified that the pt refused to have an IV.

## 2014-12-19 NOTE — ED Notes (Signed)
Admitting MD at bedside.

## 2014-12-19 NOTE — ED Notes (Signed)
IV team at bedside. Pt is refusing an IV. This RN explained to the pt that she needed it for the potassium, and because she needed it for admission, but the pt refused. Pt also refused to have the potassium infused.

## 2014-12-19 NOTE — ED Provider Notes (Signed)
CSN: [id number]     Arrival date & time 12/18/14  2357 History   This chart was scribed for Loren Racer, MD by Arlan Organ, ED Scribe. This patient was seen in room D31C/D31C and the patient's care was started 12:21 AM.   Chief Complaint  Patient presents with  . Chest Pain   HPI  HPI Comments: Mckenzie Williamson brought in by EMS is a 81 y.o. female with a PMHx of CAD, DM, hyperlipidemia, HTN and CHF who presents to the Emergency Department complaining of sudden onset, constant chest pain onset yesterday afternoon while laying on the couch. Current pain is described as spasms. However, pt reports pressure like chest discomfort all week.  Pain is made worse with movement and palpitations. No alleviating factors at this time. Mckenzie Williamson feels discomfort feels similar to previous pain prior to CABG. She also reports mild shortness of breath she associated with chest pain along with L arm pain and bilateral lower leg swelling. No OTC medications or home remedies attempted prior to arrival. No recent fever or chills. Mckenzie Williamson recently started on Lasix to treat bilateral edema. PSHx includes CABG-2005 and cardiac catheterization-2008. No known allergies to medications.  Past Medical History  Diagnosis Date  . Coronary artery disease     s/p CABG in 2005; last cath in 2008 showing occlusion of all SVG's;   . Diabetes mellitus   . Hyperlipidemia   . Hypertension   . LV dysfunction     EF 30 to 35% per echo 3/13  . Dizzy   . Hypoglycemia   . Cataract   . CHF (congestive heart failure)   . Non compliance w medication regimen   . Ischemic cardiomyopathy   . Memory difficulty    Past Surgical History  Procedure Laterality Date  . Coronary artery bypass graft  2005    THIS INCLUDED SAPHENOUS VEIN GRAFT TO THE PDA, SAPHENOUS VEIN GRAFT TO OBTUSE MARGINAL VESSEL, AS LIMA GRAFT TO THE LAD.  Marland Kitchen Cardiac catheterization  2008    EF 50-55%. SHOWED OCCLUSION OF VEIN GRAFT  . Cholecystectomy     . Transthoracic echocardiogram  05/04/2004    EF 50-55%  . Cardiovascular stress test  12/24/2006    EF 47%   Family History  Problem Relation Age of Onset  . Tuberculosis Mother   . Lung cancer Father   . Diabetes Sister   . Heart attack Brother   . Hypertension Brother   . Diabetes Brother    Social History  Substance Use Topics  . Smoking status: Never Smoker   . Smokeless tobacco: Never Used  . Alcohol Use: No   OB History    No data available     Review of Systems  Constitutional: Negative for fever and chills.  Respiratory: Positive for shortness of breath.   Cardiovascular: Positive for chest pain and leg swelling.  Gastrointestinal: Negative for nausea, vomiting and abdominal pain.  Neurological: Negative for dizziness, weakness and numbness.  Psychiatric/Behavioral: Negative for confusion.  All other systems reviewed and are negative.     Allergies  Review of patient's allergies indicates no known allergies.  Home Medications   Prior to Admission medications   Medication Sig Start Date End Date Taking? Authorizing Provider  aspirin (EQ ASPIRIN) 325 MG EC tablet Take 1 tablet (325 mg total) by mouth daily. 10/05/14  Yes Sharon Seller, NP  docusate sodium (COLACE) 100 MG capsule Take 1 capsule (100 mg total) by mouth every 12 (  twelve) hours. 11/10/14  Yes Pricilla Loveless, MD  Insulin Glargine (LANTUS SOLOSTAR) 100 UNIT/ML Solostar Pen Inject 10 Units into the skin daily at 10 pm. 04/06/14  Yes Devoria Albe, MD  lisinopril (PRINIVIL,ZESTRIL) 10 MG tablet Take 1 tablet (10 mg total) by mouth daily. 10/19/14 10/19/15 Yes Sharon Seller, NP  metFORMIN (GLUCOPHAGE) 500 MG tablet Take 1 tablet (500 mg total) by mouth daily with breakfast. 10/19/14  Yes Sharon Seller, NP  Multiple Vitamin (MULTIVITAMIN) tablet Take 1 tablet by mouth daily.     Yes Historical Provider, MD  pravastatin (PRAVACHOL) 40 MG tablet Take 1 tablet (40 mg total) by mouth daily. 10/19/14  Yes  Sharon Seller, NP  acetaminophen (TYLENOL) 325 MG tablet Take 2 tablets (650 mg total) by mouth every 4 (four) hours as needed for headache or mild pain. 12/24/14   Shanker Levora Dredge, MD  carvedilol (COREG) 6.25 MG tablet Take 1 tablet (6.25 mg total) by mouth 2 (two) times daily with a meal. 12/24/14   Shanker Levora Dredge, MD  furosemide (LASIX) 20 MG tablet Take 2 tablets (40 mg total) by mouth daily. 12/24/14   Shanker Levora Dredge, MD  insulin aspart (NOVOLOG) 100 UNIT/ML injection 0-15 Units, Subcutaneous, 3 times daily with meals CBG < 70: implement hypoglycemia protocol CBG 70 - 120: 0 units CBG 121 - 150: 2 units CBG 151 - 200: 3 units CBG 201 - 250: 5 units CBG 251 - 300: 8 units CBG 301 - 350: 11 units CBG 351 - 400: 15 units CBG > 400: call MD 12/24/14   Maretta Bees, MD  pantoprazole (PROTONIX) 40 MG tablet Take 1 tablet (40 mg total) by mouth daily before breakfast. 12/24/14   Maretta Bees, MD  polyethylene glycol (MIRALAX) packet Take 17 g by mouth daily as needed for mild constipation. 12/24/14   Shanker Levora Dredge, MD  potassium chloride SA (K-DUR,KLOR-CON) 20 MEQ tablet Take 1 tablet (20 mEq total) by mouth daily. 12/24/14   Shanker Levora Dredge, MD  traMADol (ULTRAM) 50 MG tablet Take 1 tablet (50 mg total) by mouth every 6 (six) hours as needed. 12/24/14   Shanker Levora Dredge, MD   Triage Vitals: BP 126/53 mmHg  Pulse 80  Temp(Src) 98.4 F (36.9 C) (Oral)  Resp 18  Ht 5\' 2"  (1.575 m)  Wt 139 lb 1.8 oz (63.1 kg)  BMI 25.44 kg/m2  SpO2 97%   Physical Exam  Constitutional: She is oriented to person, place, and time. She appears well-developed and well-nourished. No distress.  HENT:  Head: Normocephalic and atraumatic.  Mouth/Throat: Oropharynx is clear and moist.  Eyes: EOM are normal. Pupils are equal, round, and reactive to light.  Neck: Normal range of motion. Neck supple.  Cardiovascular: Normal rate and regular rhythm.   Pulmonary/Chest: Effort normal and breath  sounds normal. No respiratory distress. She has no wheezes. She has no rales. She exhibits tenderness (Left sided chest tenderness to palpation).  Abdominal: Soft. Bowel sounds are normal. She exhibits no distension and no mass. There is tenderness (Mild epigastric tenderness). There is no rebound and no guarding.  Musculoskeletal: Normal range of motion. She exhibits edema. She exhibits no tenderness.  1+ bl lower ext edema. No calf swelling or tenderness  Neurological: She is alert and oriented to person, place, and time.  Skin: Skin is warm and dry. No rash noted. No erythema.  Psychiatric: She has a normal mood and affect. Her behavior is normal.  Nursing  note and vitals reviewed.   ED Course  Procedures (including critical care time)  DIAGNOSTIC STUDIES: Oxygen Saturation is 98% on room air, normal by my interpretation.    COORDINATION OF CARE: 12:21 AM- Will order CXR, BMP, CBC, i-stat troponin , EKG. IDiscussed treatment plan with pt at bedside and pt agreed to plan.    Labs Review Labs Reviewed  BASIC METABOLIC PANEL - Abnormal; Notable for the following:    Potassium 2.6 (*)    Glucose, Bld 134 (*)    Calcium 8.7 (*)    All other components within normal limits  CBC WITH DIFFERENTIAL/PLATELET - Abnormal; Notable for the following:    Hemoglobin 10.5 (*)    HCT 32.7 (*)    All other components within normal limits  BASIC METABOLIC PANEL - Abnormal; Notable for the following:    Glucose, Bld 244 (*)    Calcium 8.5 (*)    All other components within normal limits  GLUCOSE, CAPILLARY - Abnormal; Notable for the following:    Glucose-Capillary 219 (*)    All other components within normal limits  TROPONIN I - Abnormal; Notable for the following:    Troponin I 0.06 (*)    All other components within normal limits  TROPONIN I - Abnormal; Notable for the following:    Troponin I 0.06 (*)    All other components within normal limits  TROPONIN I - Abnormal; Notable for the  following:    Troponin I 0.07 (*)    All other components within normal limits  BRAIN NATRIURETIC PEPTIDE - Abnormal; Notable for the following:    B Natriuretic Peptide 210.2 (*)    All other components within normal limits  HEMOGLOBIN A1C - Abnormal; Notable for the following:    Hgb A1c MFr Bld 7.4 (*)    All other components within normal limits  GLUCOSE, CAPILLARY - Abnormal; Notable for the following:    Glucose-Capillary 136 (*)    All other components within normal limits  GLUCOSE, CAPILLARY - Abnormal; Notable for the following:    Glucose-Capillary 141 (*)    All other components within normal limits  GLUCOSE, CAPILLARY - Abnormal; Notable for the following:    Glucose-Capillary 138 (*)    All other components within normal limits  GLUCOSE, CAPILLARY - Abnormal; Notable for the following:    Glucose-Capillary 120 (*)    All other components within normal limits  GLUCOSE, CAPILLARY - Abnormal; Notable for the following:    Glucose-Capillary 174 (*)    All other components within normal limits  GLUCOSE, CAPILLARY - Abnormal; Notable for the following:    Glucose-Capillary 120 (*)    All other components within normal limits  BASIC METABOLIC PANEL - Abnormal; Notable for the following:    Chloride 100 (*)    Glucose, Bld 215 (*)    Calcium 8.8 (*)    GFR calc non Af Amer 60 (*)    All other components within normal limits  GLUCOSE, CAPILLARY - Abnormal; Notable for the following:    Glucose-Capillary 145 (*)    All other components within normal limits  GLUCOSE, CAPILLARY - Abnormal; Notable for the following:    Glucose-Capillary 155 (*)    All other components within normal limits  GLUCOSE, CAPILLARY - Abnormal; Notable for the following:    Glucose-Capillary 131 (*)    All other components within normal limits  BASIC METABOLIC PANEL - Abnormal; Notable for the following:    Glucose, Bld 179 (*)  GFR calc non Af Amer 54 (*)    All other components within normal  limits  GLUCOSE, CAPILLARY - Abnormal; Notable for the following:    Glucose-Capillary 109 (*)    All other components within normal limits  GLUCOSE, CAPILLARY - Abnormal; Notable for the following:    Glucose-Capillary 199 (*)    All other components within normal limits  GLUCOSE, CAPILLARY - Abnormal; Notable for the following:    Glucose-Capillary 168 (*)    All other components within normal limits  GLUCOSE, CAPILLARY - Abnormal; Notable for the following:    Glucose-Capillary 169 (*)    All other components within normal limits  GLUCOSE, CAPILLARY - Abnormal; Notable for the following:    Glucose-Capillary 207 (*)    All other components within normal limits  GLUCOSE, CAPILLARY - Abnormal; Notable for the following:    Glucose-Capillary 168 (*)    All other components within normal limits  GLUCOSE, CAPILLARY - Abnormal; Notable for the following:    Glucose-Capillary 212 (*)    All other components within normal limits  GLUCOSE, CAPILLARY - Abnormal; Notable for the following:    Glucose-Capillary 180 (*)    All other components within normal limits  GLUCOSE, CAPILLARY - Abnormal; Notable for the following:    Glucose-Capillary 119 (*)    All other components within normal limits  GLUCOSE, CAPILLARY - Abnormal; Notable for the following:    Glucose-Capillary 200 (*)    All other components within normal limits  GLUCOSE, CAPILLARY - Abnormal; Notable for the following:    Glucose-Capillary 215 (*)    All other components within normal limits  PROTEIN ELECTROPHORESIS, SERUM - Abnormal; Notable for the following:    Albumin ELP 2.7 (*)    All other components within normal limits  IMMUNOFIXATION ELECTROPHORESIS, URINE (WITH TOT PROT) - Abnormal; Notable for the following:    Free Lt Chn Excr Rate 27.50 (*)    All other components within normal limits  GLUCOSE, CAPILLARY  I-STAT TROPOININ, ED    Imaging Review No results found.   EKG Interpretation   Date/Time:   Sunday December 19 2014 00:06:49 EDT Ventricular Rate:  87 PR Interval:  177 QRS Duration: 107 QT Interval:  401 QTC Calculation: 482 R Axis:   -2 Text Interpretation:  Sinus rhythm Nonspecific T abnormalities, lateral  leads Confirmed by Ranae Palms  MD, Ein Rijo ([id number]) on 12/19/2014 12:36:34 AM      MDM   Final diagnoses:  Atypical chest pain  Hypokalemia    Pt refuses IV. Potassium replaced orally. Discussed with [facility] and will admit for R/o. Pain is atypical for CAD but has risk factors.   I personally performed the services described in this documentation, which was scribed in my presence. The recorded information has been reviewed and is accurate.    Loren Racer, MD 01/11/15 [phone]

## 2014-12-20 DIAGNOSIS — I5022 Chronic systolic (congestive) heart failure: Secondary | ICD-10-CM | POA: Diagnosis not present

## 2014-12-20 DIAGNOSIS — I10 Essential (primary) hypertension: Secondary | ICD-10-CM | POA: Diagnosis not present

## 2014-12-20 DIAGNOSIS — E119 Type 2 diabetes mellitus without complications: Secondary | ICD-10-CM | POA: Diagnosis not present

## 2014-12-20 LAB — GLUCOSE, CAPILLARY
GLUCOSE-CAPILLARY: 145 mg/dL — AB (ref 65–99)
Glucose-Capillary: 120 mg/dL — ABNORMAL HIGH (ref 65–99)
Glucose-Capillary: 174 mg/dL — ABNORMAL HIGH (ref 65–99)
Glucose-Capillary: 120 mg/dL — ABNORMAL HIGH (ref 65–99)

## 2014-12-20 LAB — HEMOGLOBIN A1C
Hgb A1c MFr Bld: 7.4 % — ABNORMAL HIGH (ref 4.8–5.6)
Mean Plasma Glucose: 166 mg/dL

## 2014-12-20 LAB — TROPONIN I: Troponin I: 0.07 ng/mL — ABNORMAL HIGH (ref <0.031)

## 2014-12-20 MED ORDER — POTASSIUM CHLORIDE CRYS ER 20 MEQ PO TBCR
40.0000 meq | EXTENDED_RELEASE_TABLET | Freq: Two times a day (BID) | ORAL | Status: AC
Start: 2014-12-20 — End: 2014-12-20
  Administered 2014-12-20 (×2): 40 meq via ORAL
  Filled 2014-12-20 (×2): qty 2

## 2014-12-20 MED ORDER — ENOXAPARIN SODIUM 40 MG/0.4ML ~~LOC~~ SOLN
40.0000 mg | SUBCUTANEOUS | Status: DC
  Administered 2014-12-21 – 2014-12-24 (×4): 40 mg via SUBCUTANEOUS
  Filled 2014-12-20 (×4): qty 0.4

## 2014-12-20 NOTE — Progress Notes (Signed)
Pt daughter [name] called floor to ask of pt condition, Pt gave RN permission to speak to daughter about health information. Pt daughter's number placed in chart, requesting to hear information about patients condition tomorrow.

## 2014-12-20 NOTE — Progress Notes (Signed)
[facility] PROGRESS NOTE  Assessment/Plan: Chest pain - Hemoglobin is 10.5 unchanged compared to previous. - Likely GERD, will start on Protonix on 7.24.2016 with improvemnt - Cardiac markers minimally elevated. 2-D echo pending.  Hypokalemia: - now resolved. - cont oral K.   Chronic systolic congestive heart failure, NYHA class 2 - Apply TED.Good UOP, cont IV lasix. - Restrict her fluids, daily weights monitor electrolytes. - b-met peding.  Controlled diabetes mellitus type II without complication: - Cont.long-acting insulin, check CBGs before meals and at bedtime to 20 kg insulin.  Essential hypertension - Bp improving with diuresis.    Code Status: full Family Communication: none  Disposition Plan: home in 2 days   Consultants:  none  Procedures:  ECHO  CXR  Antibiotics:  None  HPI/Subjective: No chest pain overnight.  Objective: Filed Vitals:   12/19/14 1300 12/19/14 2135 12/20/14 0201 12/20/14 0542  BP: 152/61 128/69 124/64 122/60  Pulse: 94 93 88 80  Temp: 98.1 F (36.7 C) 98 F (36.7 C) 97.2 F (36.2 C) 97.5 F (36.4 C)  TempSrc: Oral Oral Oral Oral  Resp: '18 18 18 18  ' Height:      Weight:    64.898 kg (143 lb 1.2 oz)  SpO2: 93% 97% 98% 100%    Intake/Output Summary (Last 24 hours) at 12/20/14 0842 Last data filed at 12/20/14 0838  Gross per 24 hour  Intake   1200 ml  Output   2350 ml  Net  -1150 ml   Filed Weights   12/19/14 0621 12/20/14 0542  Weight: 65.2 kg (143 lb 11.8 oz) 64.898 kg (143 lb 1.2 oz)    Exam:  General: Alert, awake, oriented x3, in no acute distress.  HEENT: No bruits, no goiter. - JVD Heart: Regular rate and rhythm. Lungs: Good air movement, clear Abdomen: Soft, nontender, nondistended, positive bowel sounds.  Neuro: Grossly intact, nonfocal.   Data Reviewed: Basic Metabolic Panel:  Recent Labs Lab 12/14/14 1609 12/19/14 0102 12/19/14 0840  NA 146* 142 141  K 2.8* 2.6* 3.5  CL 101  102 103  CO2 '26 29 30  ' GLUCOSE 152* 134* 244*  BUN '13 11 9  ' CREATININE 0.71 0.70 0.73  CALCIUM 9.4 8.7* 8.5*   Liver Function Tests: No results for input(s): AST, ALT, ALKPHOS, BILITOT, PROT, ALBUMIN in the last 168 hours. No results for input(s): LIPASE, AMYLASE in the last 168 hours. No results for input(s): AMMONIA in the last 168 hours. CBC:  Recent Labs Lab 12/19/14 0102  WBC 8.8  NEUTROABS 4.9  HGB 10.5*  HCT 32.7*  MCV 81.1  PLT 266   Cardiac Enzymes:  Recent Labs Lab 12/19/14 1250 12/19/14 1808 12/19/14 2334  TROPONINI 0.06* 0.06* 0.07*   BNP (last 3 results)  Recent Labs  12/19/14 1250  BNP 210.2*    ProBNP (last 3 results) No results for input(s): PROBNP in the last 8760 hours.  CBG:  Recent Labs Lab 12/19/14 0653 12/19/14 1118 12/19/14 1607 12/19/14 2136 12/20/14 0546  GLUCAP 219* 136* 141* 138* 120*    No results found for this or any previous visit (from the past 240 hour(s)).   Studies: Dg Chest 2 View  12/19/2014   CLINICAL DATA:  81 year old female with chest pain  EXAM: CHEST  2 VIEW  COMPARISON:  Chest radiograph dated 10/05/2011  FINDINGS: Two views of the chest demonstrate clear lungs. There is no focal consolidation, pleural effusion, or pneumothorax. Stable cardiomegaly. Median sternotomy wires and CABG clips  noted. Degenerate changes of the spine.  IMPRESSION: No active cardiopulmonary disease.  Stable cardiomegaly.   Electronically Signed   By: Anner Crete M.D.   On: 12/19/2014 01:04    Scheduled Meds: . aspirin  325 mg Oral Daily  . docusate sodium  100 mg Oral Q12H  . enoxaparin (LOVENOX) injection  30 mg Subcutaneous Q24H  . furosemide  40 mg Intravenous BID  . insulin aspart  0-15 Units Subcutaneous TID WC  . insulin aspart  0-5 Units Subcutaneous QHS  . insulin aspart  3 Units Subcutaneous TID WC  . insulin glargine  10 Units Subcutaneous Q2200  . lisinopril  10 mg Oral Daily  . pantoprazole  40 mg Oral BID  .  pravastatin  40 mg Oral Daily   Continuous Infusions:   Time Spent: 25 min   Mckenzie Williamson  [facility] Pager [phone]. If 7PM-7AM, please contact night-coverage at [email or web address], password [facility] 12/20/2014, 8:42 AM

## 2014-12-21 ENCOUNTER — Observation Stay (HOSPITAL_COMMUNITY): Payer: Medicare Other

## 2014-12-21 DIAGNOSIS — I10 Essential (primary) hypertension: Secondary | ICD-10-CM | POA: Diagnosis not present

## 2014-12-21 DIAGNOSIS — M25551 Pain in right hip: Secondary | ICD-10-CM | POA: Diagnosis present

## 2014-12-21 DIAGNOSIS — E785 Hyperlipidemia, unspecified: Secondary | ICD-10-CM | POA: Diagnosis present

## 2014-12-21 DIAGNOSIS — I5023 Acute on chronic systolic (congestive) heart failure: Secondary | ICD-10-CM | POA: Diagnosis present

## 2014-12-21 DIAGNOSIS — K219 Gastro-esophageal reflux disease without esophagitis: Secondary | ICD-10-CM | POA: Diagnosis not present

## 2014-12-21 DIAGNOSIS — E119 Type 2 diabetes mellitus without complications: Secondary | ICD-10-CM | POA: Diagnosis not present

## 2014-12-21 DIAGNOSIS — Z951 Presence of aortocoronary bypass graft: Secondary | ICD-10-CM | POA: Diagnosis not present

## 2014-12-21 DIAGNOSIS — Z833 Family history of diabetes mellitus: Secondary | ICD-10-CM | POA: Diagnosis not present

## 2014-12-21 DIAGNOSIS — R0789 Other chest pain: Secondary | ICD-10-CM | POA: Diagnosis not present

## 2014-12-21 DIAGNOSIS — I2510 Atherosclerotic heart disease of native coronary artery without angina pectoris: Secondary | ICD-10-CM | POA: Diagnosis present

## 2014-12-21 DIAGNOSIS — I5022 Chronic systolic (congestive) heart failure: Secondary | ICD-10-CM | POA: Diagnosis not present

## 2014-12-21 DIAGNOSIS — R079 Chest pain, unspecified: Secondary | ICD-10-CM

## 2014-12-21 DIAGNOSIS — I255 Ischemic cardiomyopathy: Secondary | ICD-10-CM | POA: Diagnosis present

## 2014-12-21 DIAGNOSIS — I5021 Acute systolic (congestive) heart failure: Secondary | ICD-10-CM

## 2014-12-21 DIAGNOSIS — Z9114 Patient's other noncompliance with medication regimen: Secondary | ICD-10-CM | POA: Diagnosis present

## 2014-12-21 DIAGNOSIS — Z7982 Long term (current) use of aspirin: Secondary | ICD-10-CM | POA: Diagnosis not present

## 2014-12-21 DIAGNOSIS — Z8249 Family history of ischemic heart disease and other diseases of the circulatory system: Secondary | ICD-10-CM | POA: Diagnosis not present

## 2014-12-21 DIAGNOSIS — E876 Hypokalemia: Secondary | ICD-10-CM | POA: Diagnosis not present

## 2014-12-21 DIAGNOSIS — T501X5A Adverse effect of loop [high-ceiling] diuretics, initial encounter: Secondary | ICD-10-CM | POA: Diagnosis not present

## 2014-12-21 DIAGNOSIS — Z794 Long term (current) use of insulin: Secondary | ICD-10-CM | POA: Diagnosis not present

## 2014-12-21 LAB — BASIC METABOLIC PANEL
Anion gap: 8 (ref 5–15)
BUN: 10 mg/dL (ref 6–20)
CHLORIDE: 100 mmol/L — AB (ref 101–111)
CO2: 31 mmol/L (ref 22–32)
Calcium: 8.8 mg/dL — ABNORMAL LOW (ref 8.9–10.3)
Creatinine, Ser: 0.89 mg/dL (ref 0.44–1.00)
GFR calc Af Amer: >60 mL/min (ref >60)
GFR, EST NON AFRICAN AMERICAN: 60 mL/min — AB (ref >60)
GLUCOSE: 215 mg/dL — AB (ref 65–99)
Potassium: 4.4 mmol/L (ref 3.5–5.1)
Sodium: 139 mmol/L (ref 135–145)

## 2014-12-21 LAB — GLUCOSE, CAPILLARY
GLUCOSE-CAPILLARY: 155 mg/dL — AB (ref 65–99)
GLUCOSE-CAPILLARY: 131 mg/dL — AB (ref 65–99)
Glucose-Capillary: 109 mg/dL — ABNORMAL HIGH (ref 65–99)
Glucose-Capillary: 199 mg/dL — ABNORMAL HIGH (ref 65–99)

## 2014-12-21 MED ORDER — FUROSEMIDE 10 MG/ML IJ SOLN
20.0000 mg | Freq: Two times a day (BID) | INTRAMUSCULAR | Status: DC
  Administered 2014-12-21 (×2): 20 mg via INTRAVENOUS
  Filled 2014-12-21 (×5): qty 2

## 2014-12-21 MED ORDER — LISINOPRIL 20 MG PO TABS
20.0000 mg | ORAL_TABLET | Freq: Every day | ORAL | Status: DC
  Administered 2014-12-21 – 2014-12-24 (×4): 20 mg via ORAL
  Filled 2014-12-21 (×4): qty 1

## 2014-12-21 MED ORDER — PANTOPRAZOLE SODIUM 40 MG PO TBEC
40.0000 mg | DELAYED_RELEASE_TABLET | Freq: Every day | ORAL | Status: DC
  Administered 2014-12-21 – 2014-12-24 (×4): 40 mg via ORAL
  Filled 2014-12-21 (×3): qty 1

## 2014-12-21 MED ORDER — CARVEDILOL 3.125 MG PO TABS
3.1250 mg | ORAL_TABLET | Freq: Two times a day (BID) | ORAL | Status: DC
  Administered 2014-12-21 – 2014-12-22 (×3): 3.125 mg via ORAL
  Filled 2014-12-21 (×6): qty 1

## 2014-12-21 NOTE — Care Management Note (Addendum)
Case Management Note  Patient Details  Name: Mckenzie Williamson MRN: [id number] Date of Birth: 09/30/1932  Subjective/Objective:      CHF              Action/Plan: Home Health  Expected Discharge Date:  12/21/2014              Expected Discharge Plan:  Home w Home Health Services  In-House Referral:  Clinical Social Work  Discharge planning Services  CM Consult  Post Acute Care Choice:  Home Health, Resumption of Svcs/PTA Provider Choice offered to:  Adult Children  DME Arranged:    DME Agency:     HH Arranged:  PT HH Agency:  [facility]  Status of Service:     Medicare Important Message Given:    Date Medicare IM Given:    Medicare IM give by:    Date Additional Medicare IM Given:    Additional Medicare Important Message give by:     If discussed at Long Length of Stay Meetings, dates discussed:    Additional Comments: 12/21/2014 1500 NCM spoke to [place] and pt is active with [facility] RN, PT, OT and aide. Isidoro Donning RN CCM Case Mgmt phone [phone]  Consulted for Home Health. NCM spoke to pt and gave permission to speak to dtr, [employer] # [phone] or son, Braxton Feathers. Pt states she lives at home and her sons are there but they work full-time. She has RW and 3n1 at home. Was active with [facility] at home for HHPT. NCM contacted dtr and goal is for pt to move to [place]. States she did check and believe [place] services in her area. Dtr states she does not have a definite date when she will come to [facility]. Waiting final recommendations for home.  Elliot Cousin, RN 12/21/2014, 2:50 PM

## 2014-12-21 NOTE — Progress Notes (Addendum)
[facility] PROGRESS NOTE  Assessment/Plan: Chest pain: - Hemoglobin is 10.5 unchanged compared to previous. - Likely GERD, will start on Protonix on 7.24.2016 with improvemnt - Cardiac markers minimally elevated. Unlikely ACS pattern.2-D echo pending.  Hypokalemia: - now resolved. - cont oral K.  Acute on Chronic systolic congestive heart failure, NYHA class 2 - Apply TED.improved UOP, cont IV lasix at a lower dose. - start low dose coreg and increase ACE-I. - weight is improving 62.4 kg - Restrict her fluids, daily weights, monitor lytes.  Controlled diabetes mellitus type II without complication: - Cont.long-acting insulin, check CBGs before meals and at bedtime to 20 kg insulin.  Essential hypertension - Bp improving with diuresis.   Code Status: full Family Communication: none  Disposition Plan: home in 2 days   Consultants:  none  Procedures:  ECHO: pending  CXR  Antibiotics:  None  HPI/Subjective: Feels better.  Objective: Filed Vitals:   12/20/14 1800 12/20/14 1955 12/21/14 0538 12/21/14 0615  BP: 133/70 151/65 152/75   Pulse: 88 88 84   Temp:  98.3 F (36.8 C) 98.4 F (36.9 C)   TempSrc:  Oral Oral   Resp: Height:      Weight:    62.4 kg (137 lb 9.1 oz)  SpO2: 100% 98% 98%     Intake/Output Summary (Last 24 hours) at 12/21/14 0736 Last data filed at 12/21/14 0655  Gross per 24 hour  Intake   1215 ml  Output   3225 ml  Net  -2010 ml   Filed Weights   12/19/14 0621 12/20/14 0542 12/21/14 0615  Weight: 65.2 kg (143 lb 11.8 oz) 64.898 kg (143 lb 1.2 oz) 62.4 kg (137 lb 9.1 oz)    Exam:  General: Alert, awake, oriented x3, in no acute distress.  HEENT: No bruits, no goiter. - JVD Heart: Regular rate and rhythm. Lungs: Good air movement, clear Abdomen: Soft, nontender, nondistended, positive bowel sounds.  Neuro: Grossly intact, nonfocal.   Data Reviewed: Basic Metabolic Panel:  Recent Labs Lab  12/14/14 1609 12/19/14 0102 12/19/14 0840 12/21/14 0323  NA 146* 142 141 139  K 2.8* 2.6* 3.5 4.4  CL 101 102 103 100*  CO2 GLUCOSE 152* 134* 244* 215*  BUN CREATININE 0.71 0.70 0.73 0.89  CALCIUM 9.4 8.7* 8.5* 8.8*   Liver Function Tests: No results for input(s): AST, ALT, ALKPHOS, BILITOT, PROT, ALBUMIN in the last 168 hours. No results for input(s): LIPASE, AMYLASE in the last 168 hours. No results for input(s): AMMONIA in the last 168 hours. CBC:  Recent Labs Lab 12/19/14 0102  WBC 8.8  NEUTROABS 4.9  HGB 10.5*  HCT 32.7*  MCV 81.1  PLT 266   Cardiac Enzymes:  Recent Labs Lab 12/19/14 1250 12/19/14 1808 12/19/14 2334  TROPONINI 0.06* 0.06* 0.07*   BNP (last 3 results)  Recent Labs  12/19/14 1250  BNP 210.2*    ProBNP (last 3 results) No results for input(s): PROBNP in the last 8760 hours.  CBG:  Recent Labs Lab 12/20/14 0546 12/20/14 1100 12/20/14 1625 12/20/14 2133 12/21/14 0556  GLUCAP 120* 174* 120* 145* 155*    No results found for this or any previous visit (from the past 240 hour(s)).   Studies: No results found.  Scheduled Meds: . aspirin  325 mg Oral Daily  . docusate sodium  100 mg Oral Q12H  . enoxaparin (LOVENOX) injection  40 mg Subcutaneous Q24H  . furosemide  40 mg Intravenous BID  . insulin aspart  0-15 Units Subcutaneous TID WC  . insulin aspart  0-5 Units Subcutaneous QHS  . insulin aspart  3 Units Subcutaneous TID WC  . insulin glargine  10 Units Subcutaneous Q2200  . lisinopril  10 mg Oral Daily  . pantoprazole  40 mg Oral BID  . pravastatin  40 mg Oral Daily   Continuous Infusions:   Time Spent: 25 min   Marinda Elk  [facility] Pager [phone]. If 7PM-7AM, please contact night-coverage at [email or web address], password [facility] 12/21/2014, 7:36 AM

## 2014-12-21 NOTE — Progress Notes (Signed)
  Echocardiogram 2D Echocardiogram has been performed.  Cathie Beams 12/21/2014, 10:19 AM

## 2014-12-21 NOTE — Progress Notes (Signed)
PT Cancellation Note  Patient Details Name: Mareesa Gathright MRN: [id number] DOB: 02/11/1933   Cancelled Treatment:    Reason Eval/Treat Not Completed: Medical issues which prohibited therapy Holding PT evaluation as pt with upward trending troponins. Will await clearance from Cardiology prior to initiation of PT eval.   Eura Radabaugh A Mikena Masoner 12/21/2014, 1:32 PM  Mylo Red, PT, DPT [phone]

## 2014-12-22 LAB — BASIC METABOLIC PANEL
Anion gap: 7 (ref 5–15)
BUN: 14 mg/dL (ref 6–20)
CHLORIDE: 101 mmol/L (ref 101–111)
CO2: 32 mmol/L (ref 22–32)
CREATININE: 0.97 mg/dL (ref 0.44–1.00)
Calcium: 9.1 mg/dL (ref 8.9–10.3)
GFR, EST NON AFRICAN AMERICAN: 54 mL/min — AB (ref >60)
Glucose, Bld: 179 mg/dL — ABNORMAL HIGH (ref 65–99)
Potassium: 4 mmol/L (ref 3.5–5.1)
SODIUM: 140 mmol/L (ref 135–145)

## 2014-12-22 LAB — GLUCOSE, CAPILLARY
GLUCOSE-CAPILLARY: 168 mg/dL — AB (ref 65–99)
GLUCOSE-CAPILLARY: 207 mg/dL — AB (ref 65–99)
GLUCOSE-CAPILLARY: 89 mg/dL (ref 65–99)
Glucose-Capillary: 169 mg/dL — ABNORMAL HIGH (ref 65–99)

## 2014-12-22 MED ORDER — FUROSEMIDE 40 MG PO TABS
40.0000 mg | ORAL_TABLET | Freq: Every day | ORAL | Status: DC
  Administered 2014-12-22 – 2014-12-24 (×3): 40 mg via ORAL
  Filled 2014-12-22 (×3): qty 1

## 2014-12-22 MED ORDER — CARVEDILOL 3.125 MG PO TABS
3.1250 mg | ORAL_TABLET | Freq: Once | ORAL | Status: AC
  Administered 2014-12-22: 3.125 mg via ORAL
  Filled 2014-12-22: qty 1

## 2014-12-22 MED ORDER — CARVEDILOL 6.25 MG PO TABS
6.2500 mg | ORAL_TABLET | Freq: Two times a day (BID) | ORAL | Status: DC
  Administered 2014-12-22 – 2014-12-24 (×4): 6.25 mg via ORAL
  Filled 2014-12-22 (×6): qty 1

## 2014-12-22 MED ORDER — METFORMIN HCL 500 MG PO TABS
500.0000 mg | ORAL_TABLET | Freq: Every day | ORAL | Status: DC
  Administered 2014-12-23 – 2014-12-24 (×2): 500 mg via ORAL
  Filled 2014-12-22 (×3): qty 1

## 2014-12-22 NOTE — Progress Notes (Signed)
[facility] PROGRESS NOTE  Assessment/Plan: Chest pain: - Hemoglobin is 10.5 unchanged compared to previous. - Likely GERD, will start on Protonix on 7.24.2016 with improvemnt - 2-D echo as below. - physical therapy evaluation is pending.  Hypokalemia: - now resolved. - cont oral K.   Acute on Chronic systolic congestive heart failure, NYHA class 2 - will change Lasix to orals. - continue coreg and  ACE-I. - weight is improving 61.8 kg - Restrict her fluids, daily weights, monitor lytes.  Controlled diabetes mellitus type II without complication: - Cont.long-acting insulin, continue to titrate long-acting insulin.  Essential hypertension - BP not at goal continue to titrate antihypertensive medication.    Code Status: full Family Communication: none  Disposition Plan: home in 1 days   Consultants:  none  Procedures:  ECHO: 12/21/2014 that showed: ejection fraction was in the range of 35% to 40%. Moderate hypokinesis of the entire myocardium. Doppler parameters are consistent with abnormal left ventricular relaxation (grade 1 diastolic dysfunction).  CXR  Antibiotics:  None  HPI/Subjective: Feels better, was to go home.  Objective: Filed Vitals:   12/21/14 1045 12/21/14 1330 12/21/14 2118 12/22/14 0606  BP: 148/65 137/41 132/61 138/67  Pulse: 89 88 82 88  Temp:  98.4 F (36.9 C) 98 F (36.7 C) 98.3 F (36.8 C)  TempSrc:  Oral Oral Oral  Resp:  18 18 18   Height:      Weight:    61.866 kg (136 lb 6.2 oz)  SpO2:  100% 100% 100%    Intake/Output Summary (Last 24 hours) at 12/22/14 0811 Last data filed at 12/22/14 1610  Gross per 24 hour  Intake    480 ml  Output   1600 ml  Net  -1120 ml   Filed Weights   12/20/14 0542 12/21/14 0615 12/22/14 0606  Weight: 64.898 kg (143 lb 1.2 oz) 62.4 kg (137 lb 9.1 oz) 61.866 kg (136 lb 6.2 oz)    Exam:  General: Alert, awake, oriented x3, in no acute distress.  HEENT: No bruits, no goiter.  - JVD Heart: Regular rate and rhythm. Lungs: Good air movement, clear Abdomen: Soft, nontender, nondistended, positive bowel sounds.  Neuro: Grossly intact, nonfocal.   Data Reviewed: Basic Metabolic Panel:  Recent Labs Lab 12/19/14 0102 12/19/14 0840 12/21/14 0323 12/22/14 0330  NA 142 141 139 140  K 2.6* 3.5 4.4 4.0  CL 102 103 100* 101  CO2 29 30 31  32  GLUCOSE 134* 244* 215* 179*  BUN 11 9 10 14   CREATININE 0.70 0.73 0.89 0.97  CALCIUM 8.7* 8.5* 8.8* 9.1   Liver Function Tests: No results for input(s): AST, ALT, ALKPHOS, BILITOT, PROT, ALBUMIN in the last 168 hours. No results for input(s): LIPASE, AMYLASE in the last 168 hours. No results for input(s): AMMONIA in the last 168 hours. CBC:  Recent Labs Lab 12/19/14 0102  WBC 8.8  NEUTROABS 4.9  HGB 10.5*  HCT 32.7*  MCV 81.1  PLT 266   Cardiac Enzymes:  Recent Labs Lab 12/19/14 1250 12/19/14 1808 12/19/14 2334  TROPONINI 0.06* 0.06* 0.07*   BNP (last 3 results)  Recent Labs  12/19/14 1250  BNP 210.2*    ProBNP (last 3 results) No results for input(s): PROBNP in the last 8760 hours.  CBG:  Recent Labs Lab 12/21/14 0556 12/21/14 1138 12/21/14 1638 12/21/14 2117 12/22/14 0628  GLUCAP 155* 109* 131* 199* 168*    No results found for this or any previous visit (from the  past 240 hour(s)).   Studies: No results found.  Scheduled Meds: . aspirin  325 mg Oral Daily  . carvedilol  6.25 mg Oral BID WC  . docusate sodium  100 mg Oral Q12H  . enoxaparin (LOVENOX) injection  40 mg Subcutaneous Q24H  . furosemide  40 mg Oral Daily  . insulin aspart  0-15 Units Subcutaneous TID WC  . insulin aspart  0-5 Units Subcutaneous QHS  . insulin aspart  3 Units Subcutaneous TID WC  . insulin glargine  10 Units Subcutaneous Q2200  . lisinopril  20 mg Oral Daily  . [START ON 12/23/2014] metFORMIN  500 mg Oral Q breakfast  . pantoprazole  40 mg Oral QAC breakfast  . pravastatin  40 mg Oral Daily    Continuous Infusions:   Time Spent: 25 min   Marinda Elk  [facility] Pager [phone]. If 7PM-7AM, please contact night-coverage at [email or web address], password [facility] 12/22/2014, 8:11 AM  LOS: 1 day

## 2014-12-22 NOTE — Progress Notes (Signed)
Heart Failure Navigator Consult Note  Presentation: Mckenzie Williamson is a 81 y.o. female with history of CAD s/p CABG in 2005 with LHC in 2008, DM, HLD, HTN who was brought to the ED with cc of chest pain. She said it started last night while she was watching TV laying on her couch. It's a left side chest pain, spasms like and already resolved, associated with dyspnea . She said she has had that pain for the past few years and she has had cough for the past few weeks that is not productive. No fever or chills.No N/V/D/C but she has mild abdominal discomfort. She claims to be complaint with her meds. She is a very poor historian and son at bedside could not add details to her history. She uses a wheelchair to ambulate.    Past Medical History  Diagnosis Date  . Coronary artery disease     s/p CABG in 2005; last cath in 2008 showing occlusion of all SVG's;   . Diabetes mellitus   . Hyperlipidemia   . Hypertension   . LV dysfunction     EF 30 to 35% per echo 3/13  . Dizzy   . Hypoglycemia   . Cataract   . CHF (congestive heart failure)   . Non compliance w medication regimen   . Ischemic cardiomyopathy   . Memory difficulty     History   Social History  . Marital Status: Married    Spouse Name: N/A  . Number of Children: 3  . Years of Education: N/A   Social History Main Topics  . Smoking status: Never Smoker   . Smokeless tobacco: Never Used  . Alcohol Use: No  . Drug Use: No  . Sexual Activity: Not Currently   Other Topics Concern  . None   Social History Narrative    ECHO:Study Conclusions--12/21/14  - Left ventricle: Wall thickness was increased in a pattern of moderate LVH. There was focal basal hypertrophy. Systolic function was moderately reduced. The estimated ejection fraction was in the range of 35% to 40%. Moderate hypokinesis of the entire myocardium. Doppler parameters are consistent with abnormal left ventricular relaxation (grade 1  diastolic dysfunction). - Mitral valve: There was moderate regurgitation. - Left atrium: The atrium was moderately dilated.  Transthoracic echocardiography. M-mode, complete 2D, spectral Doppler, and color Doppler. Birthdate: Patient birthdate: 08-10-1932. Age: Patient is 81 yr old. Sex: Gender: female. BMI: 23.8 kg/m^2. Blood pressure:   152/75 Patient status: Inpatient. Study date: Study date: 12/21/2014. Study time: 09:48 AM. Location: Echo laboratory.  BNP    Component Value Date/Time   BNP 210.2* 12/19/2014 1250    ProBNP    Component Value Date/Time   PROBNP 47.0 10/01/2011 1010     Education Assessment and Provision:  Detailed education and instructions provided on heart failure disease management including the following:  Signs and symptoms of Heart Failure When to call the physician Importance of daily weights Low sodium diet Fluid restriction Medication management Anticipated future follow-up appointments  Patient education given on each of the above topics.  Patient acknowledges understanding and acceptance of all instructions.  I spoke with Mckenzie Williamson briefly regarding her HF.  She tells me that she has never been informed that she has HF.  She also says that she has had a "heart transplant" in the past.  I do not see that listed in her medical history and therefore I believe she may be a bit confused this am.  She does  say that her son stays with her at home.  She tells me that she does not currently weigh daily.  I will attempt to return to speak with other family members in order to do some education regarding HF.  I do not see any recent (within 3 years)  outpatient follow-up with a cardiologist noted in her chart.  She would benefit from outpatient re-referral to cardiologist.  Education Materials:  "Living Better With Heart Failure" Booklet, Daily Weight Tracker Tool    High Risk Criteria for Readmission and/or Poor Patient Outcomes:   EF  <30%-  No 35-40% with grade 1 dias dys.  2 or more admissions in 6 months-  No  Difficult social situation- ? Lives with son--may move to live with daughter at some point  Demonstrates medication noncompliance- ? denies   Barriers of Care:  Knowledge and compliance ---knowledge of caregivers  Discharge Planning:   Plans to go home with family--son immediately after discharge then possibly her daughter.

## 2014-12-22 NOTE — Evaluation (Signed)
Physical Therapy Evaluation Patient Details Name: Mckenzie Williamson MRN: [id number] DOB: March 23, 1933 Today's Date: 12/22/2014   History of Present Illness  81 y.o. female with a PMHx of CAD, DM, hyperlipidemia, HTN and CHF who presents to the Emergency Department complaining of sudden onset, constant chest pain onset yesterday afternoon while laying on the couch. Current pain is described as spasms. However, pt reports pressure like chest discomfort all week. Pain is made worse with movement and palpitations. No alleviating factors at this time. Mckenzie Williamson feels discomfort feels similar to previous pain prior to CABG. She also reports mild shortness of breath she associated with chest pain along with L arm pain and bilateral lower leg swelling. No OTC medications or home remedies attempted prior to arrival. No recent fever or chills. [name] recently started on Lasix to treat bilateral edema. PSHx includes CABG-2005 and cardiac catheterization-2008.   Clinical Impression  Pt presents with the above.  Note pt poor historian and providing unreliable information.  Per RN and son's report was w/c level for past two months, unsure of how she was transferring.  Attempted to call daughter, however did not get an answer.  She was max A for attempted transfer, however she was unable to fully pivot and feel she would be max A for squat pivot transfer.  Unsure if family able to provide this level of care.  Spoke with CSW who states daughter plans to take pt to [place] soon after she gets home, however would like to ensure that daughter feels comfortable with level of transfer for safe transport.  Will continue to follow up as able and with CSW.     Follow Up Recommendations SNF;Supervision/Assistance - 24 hour    Equipment Recommendations  Other (comment) (may need hoyer lift if going home for increased safety)    Recommendations for Other Services OT consult     Precautions / Restrictions  Precautions Precautions: Fall Precaution Comments: w/c bound for last two months Restrictions Weight Bearing Restrictions: No      Mobility  Bed Mobility               General bed mobility comments: Pt in recliner when PT arrived.   Transfers Overall transfer level: Needs assistance Equipment used: Rolling walker (2 wheeled) Transfers: Sit to/from [employer] Sit to Stand: Mod assist;Max assist Stand pivot transfers: Max assist       General transfer comment: Pt agreeable to standing with use of RW, however requires mod/max A for sit<>stand with heavy assist for hip extension and to elevate buttocks.  Cues for safe hand placement and increased forward weight shift. Then attempted gait, however pt unwilling to attempt due to fear.  Therefore pt agreeable to attempt transfer to bed, however pt unable to take steps and requires max A to elevate and attempt to step, however she then returned to sitting in recliner.    Ambulation/Gait Ambulation/Gait assistance:  (not safe without +2)              Stairs            Wheelchair Mobility    Modified Rankin (Stroke Patients Only)       Balance Overall balance assessment: Needs assistance Sitting-balance support: Feet supported Sitting balance-Leahy Scale: Poor   Postural control: Posterior lean Standing balance support: During functional activity;Bilateral upper extremity supported Standing balance-Leahy Scale: Zero Standing balance comment: Pt requires max A to remain standing in very forward flexed position.  Pertinent Vitals/Pain Pain Assessment: Faces Faces Pain Scale: Hurts little more Pain Location: back Pain Descriptors / Indicators: Aching Pain Intervention(s): Limited activity within patient's tolerance;Monitored during session;Repositioned    Home Living Family/patient expects to be discharged to:: Private residence Living Arrangements:  Children Available Help at Discharge: Family;Available PRN/intermittently (son states they are there "most of the time") Type of Home: House Home Access: Level entry     Home Layout: One level Home Equipment: Shower seat;Wheelchair - [employer] - 2 wheels      Prior Function Level of Independence: Needs assistance   Gait / Transfers Assistance Needed: Pt non-ambulatory for 2 months per sons report, unsure of transfer level  ADL's / Homemaking Assistance Needed: Pt getting assist for bathing and dressing        Hand Dominance        Extremity/Trunk Assessment   Upper Extremity Assessment: Generalized weakness           Lower Extremity Assessment: Generalized weakness      Cervical / Trunk Assessment: Kyphotic;Other exceptions  Communication      Cognition Arousal/Alertness: Awake/alert Behavior During Therapy: WFL for tasks assessed/performed Overall Cognitive Status: No family/caregiver present to determine baseline cognitive functioning                      General Comments      Exercises        Assessment/Plan    PT Assessment Patient needs continued PT services  PT Diagnosis Difficulty walking;Generalized weakness;Acute pain   PT Problem List Decreased strength;Decreased range of motion;Decreased activity tolerance;Decreased balance;Decreased mobility;Decreased cognition;Decreased knowledge of use of DME;Decreased safety awareness;Decreased knowledge of precautions  PT Treatment Interventions DME instruction;Functional mobility training;Therapeutic activities;Therapeutic exercise;Wheelchair mobility training;Patient/family education   PT Goals (Current goals can be found in the Care Plan section) Acute Rehab PT Goals Patient Stated Goal: to return home PT Goal Formulation: Patient unable to participate in goal setting Time For Goal Achievement: 12/29/14 Potential to Achieve Goals: Poor    Frequency Min 3X/week   Barriers to discharge  Decreased caregiver support      Co-evaluation               End of Session Equipment Utilized During Treatment: Gait belt Activity Tolerance: Treatment limited secondary to agitation Patient left: in chair;with call bell/phone within reach Nurse Communication: Mobility status         Time: 1610-9604 PT Time Calculation (min) (ACUTE ONLY): 17 min   Charges:   PT Evaluation $Initial PT Evaluation Tier I: 1 Procedure     PT G CodesVista Deck 12/22/2014, 5:02 PM

## 2014-12-23 ENCOUNTER — Inpatient Hospital Stay (HOSPITAL_COMMUNITY): Payer: Medicare Other

## 2014-12-23 DIAGNOSIS — K219 Gastro-esophageal reflux disease without esophagitis: Principal | ICD-10-CM

## 2014-12-23 LAB — GLUCOSE, CAPILLARY
GLUCOSE-CAPILLARY: 168 mg/dL — AB (ref 65–99)
Glucose-Capillary: 212 mg/dL — ABNORMAL HIGH (ref 65–99)
Glucose-Capillary: 180 mg/dL — ABNORMAL HIGH (ref 65–99)
Glucose-Capillary: 119 mg/dL — ABNORMAL HIGH (ref 65–99)

## 2014-12-23 NOTE — Clinical Social Work Placement (Signed)
   CLINICAL SOCIAL WORK PLACEMENT  NOTE  Date:  12/23/2014  Patient Details  Name: Daneli Butkiewicz MRN: [id number] Date of Birth: 02-21-1933  Clinical Social Work is seeking post-discharge placement for this patient at the Skilled  Nursing Facility level of care (*CSW will initial, date and re-position this form in  chart as items are completed):  Yes   Patient/family provided with [facility] Clinical Social Work Department's list of facilities offering this level of care within the geographic area requested by the patient (or if unable, by the patient's family).  Yes   Patient/family informed of their freedom to choose among providers that offer the needed level of care, that participate in Medicare, Medicaid or managed care program needed by the patient, have an available bed and are willing to accept the patient.  Yes   Patient/family informed of [facility]'s ownership interest in [facility] and [facility], as well as of the fact that they are under no obligation to receive care at these facilities.  PASRR submitted to EDS on 12/23/14     PASRR number received on 12/23/14     Existing PASRR number confirmed on       FL2 transmitted to all facilities in geographic area requested by pt/family on 12/23/14     FL2 transmitted to all facilities within larger geographic area on       Patient informed that his/her managed care company has contracts with or will negotiate with certain facilities, including the following:            Patient/family informed of bed offers received.  Patient chooses bed at       Physician recommends and patient chooses bed at      Patient to be transferred to   on  .  Patient to be transferred to facility by       Patient family notified on   of transfer.  Name of family member notified:        PHYSICIAN Please sign FL2, Please prepare priority discharge summary, including medications, Please prepare prescriptions      Additional Comment:    _______________________________________________ Sharol Harness, Theresia Majors [phone]

## 2014-12-23 NOTE — Progress Notes (Signed)
Physical Therapy Treatment Patient Details Name: Mckenzie Williamson MRN: [id number] DOB: 1933-01-17 Today's Date: 12/23/2014    History of Present Illness 81 y.o. female with a PMHx of CAD, DM, hyperlipidemia, HTN and CHF who presents to the Emergency Department complaining of sudden onset, constant chest pain onset yesterday afternoon while laying on the couch. Current pain is described as spasms. However, pt reports pressure like chest discomfort all week. Pain is made worse with movement and palpitations. No alleviating factors at this time. [name] feels discomfort feels similar to previous pain prior to CABG. She also reports mild shortness of breath she associated with chest pain along with L arm pain and bilateral lower leg swelling. No OTC medications or home remedies attempted prior to arrival. No recent fever or chills. [name] recently started on Lasix to treat bilateral edema. PSHx includes CABG-2005 and cardiac catheterization-2008.     PT Comments    Patient seen for OOB mobility. Patient continues to require increased physical assist for bed mobility and transfers. Once OOB to chair, educated patient on BLE ROM and there ex as tolerated. Patient appears receptive. Will continue to see and progress as tolerated. Continue to recommend SNF upon acute discharge.   Follow Up Recommendations  SNF;Supervision/Assistance - 24 hour     Equipment Recommendations  Other (comment) (may need hoyer lift if going home for increased safety)    Recommendations for Other Services OT consult     Precautions / Restrictions Precautions Precautions: Fall Precaution Comments: w/c bound for last two months Restrictions Weight Bearing Restrictions: No    Mobility  Bed Mobility Overal bed mobility: Needs Assistance Bed Mobility: Rolling;Supine to Sit Rolling: Min assist   Supine to sit: Mod assist     General bed mobility comments: Assist to elevate trunk and rotate to  EOb  Transfers Overall transfer level: Needs assistance Equipment used: Rolling walker (2 wheeled) Transfers: Sit to/from [employer] Sit to Stand: Mod assist;Max assist Stand pivot transfers: Max assist       General transfer comment: Patient required increased physical assist to come to upright, assist to maintain stability during pivotal transfer. Patient unable to maintain extended position despite cues for faciliation. Patient with limited shuffling steps during pivot. Cues for hand placement and safety  Ambulation/Gait                 Stairs            Wheelchair Mobility    Modified Rankin (Stroke Patients Only)       Balance   Sitting-balance support: Feet supported Sitting balance-Leahy Scale: Poor   Postural control: Posterior lean Standing balance support: During functional activity;Bilateral upper extremity supported Standing balance-Leahy Scale: Zero                      Cognition Arousal/Alertness: Awake/alert Behavior During Therapy: WFL for tasks assessed/performed Overall Cognitive Status: No family/caregiver present to determine baseline cognitive functioning                      Exercises      General Comments General comments (skin integrity, edema, etc.): educated on ROM for BLEs while in chair, educated on importance of OOB to chair. Patient receptive.      Pertinent Vitals/Pain Pain Assessment: Faces Faces Pain Scale: Hurts little more Pain Location: right leg Pain Descriptors / Indicators: Aching Pain Intervention(s): Limited activity within patient's tolerance;Monitored during session;Repositioned    Home  Living                      Prior Function            PT Goals (current goals can now be found in the care plan section) Acute Rehab PT Goals Patient Stated Goal: to return home PT Goal Formulation: Patient unable to participate in goal setting Time For Goal Achievement:  12/29/14 Potential to Achieve Goals: Poor Progress towards PT goals: Progressing toward goals    Frequency  Min 2X/week    PT Plan Current plan remains appropriate    Co-evaluation             End of Session Equipment Utilized During Treatment: Gait belt Activity Tolerance: Patient limited by fatigue;Patient limited by pain Patient left: in chair;with call bell/phone within reach     Time: 1340-1356 PT Time Calculation (min) (ACUTE ONLY): 16 min  Charges:  $Therapeutic Activity: 8-22 mins                    G CodesFabio Asa Jan 06, 2015, 5:03 PM Charlotte Crumb, PT DPT  [phone]

## 2014-12-23 NOTE — Care Management Important Message (Signed)
Important Message  Patient Details  Name: Mckenzie Williamson MRN: [id number] Date of Birth: May 22, 1933   Medicare Important Message Given:  Yes-second notification given    Yvonna Alanis 12/23/2014, 12:30 PM

## 2014-12-23 NOTE — Progress Notes (Addendum)
[facility] PROGRESS NOTE  Assessment/Plan: Chest pain: - chest likely GERD, now with protonix improved. - Hemoglobin is stable - 2-D echo as below. - physical therapy rec SNF.  Hypokalemia: - now resolved. - cont oral K.   Acute on Chronic systolic congestive heart failure, NYHA class 2 - On oral lasix tolerating it well. - continue coreg and  ACE-I. - weight is improving 61.8 kg - Restrict her fluids, daily weights, monitor lytes.  Hip pain: Right hip pain check a hip x-ray. Patient and family are poor historian. Cannot externally rotate the hip and pt cannot bare weight on her right leg.  Controlled diabetes mellitus type II without complication: - Cont.long-acting insulin, continue to titrate long-acting insulin.  Essential hypertension - BP not at goal continue to titrate antihypertensive medication.    Code Status: full Family Communication: none  Disposition Plan: home in 1 days   Consultants:  none  Procedures:  ECHO: 12/21/2014 that showed: ejection fraction was in the range of 35% to 40%. Moderate hypokinesis of the entire myocardium. Doppler parameters are consistent with abnormal left ventricular relaxation (grade 1 diastolic dysfunction).  CXR  Antibiotics:  None  HPI/Subjective: Feels better, pain with ambulation.  Objective: Filed Vitals:   12/22/14 0606 12/22/14 1400 12/22/14 2130 12/23/14 0451  BP: 138/67 98/54 113/52 130/56  Pulse: 88 77 70 90  Temp: 98.3 F (36.8 C) 97.9 F (36.6 C) 97.8 F (36.6 C) 98 F (36.7 C)  TempSrc: Oral Oral Oral Oral  Resp: Height:      Weight: 61.866 kg (136 lb 6.2 oz)   62.3 kg (137 lb 5.6 oz)  SpO2: 100% 98% 97% 96%    Intake/Output Summary (Last 24 hours) at 12/23/14 4401 Last data filed at 12/22/14 1400  Gross per 24 hour  Intake    240 ml  Output    200 ml  Net     40 ml   Filed Weights   12/21/14 0615 12/22/14 0606 12/23/14 0451  Weight: 62.4 kg (137 lb  9.1 oz) 61.866 kg (136 lb 6.2 oz) 62.3 kg (137 lb 5.6 oz)    Exam:  General: Alert, awake, oriented x3, in no acute distress.  HEENT: No bruits, no goiter. - JVD Heart: Regular rate and rhythm. Lungs: Good air movement, clear Abdomen: Soft, nontender, nondistended, positive bowel sounds.  Ext: pain to external rotation of the right hip.   Data Reviewed: Basic Metabolic Panel:  Recent Labs Lab 12/19/14 0102 12/19/14 0840 12/21/14 0323 12/22/14 0330  NA 142 141 139 140  K 2.6* 3.5 4.4 4.0  CL 102 103 100* 101  CO2 32  GLUCOSE 134* 244* 215* 179*  BUN CREATININE 0.70 0.73 0.89 0.97  CALCIUM 8.7* 8.5* 8.8* 9.1   Liver Function Tests: No results for input(s): AST, ALT, ALKPHOS, BILITOT, PROT, ALBUMIN in the last 168 hours. No results for input(s): LIPASE, AMYLASE in the last 168 hours. No results for input(s): AMMONIA in the last 168 hours. CBC:  Recent Labs Lab 12/19/14 0102  WBC 8.8  NEUTROABS 4.9  HGB 10.5*  HCT 32.7*  MCV 81.1  PLT 266   Cardiac Enzymes:  Recent Labs Lab 12/19/14 1250 12/19/14 1808 12/19/14 2334  TROPONINI 0.06* 0.06* 0.07*   BNP (last 3 results)  Recent Labs  12/19/14 1250  BNP 210.2*    ProBNP (last 3 results) No results for input(s): PROBNP in the last  8760 hours.  CBG:  Recent Labs Lab 12/22/14 0628 12/22/14 1128 12/22/14 1707 12/22/14 2136 12/23/14 0646  GLUCAP 168* 169* 207* 89 168*    No results found for this or any previous visit (from the past 240 hour(s)).   Studies: No results found.  Scheduled Meds: . aspirin  325 mg Oral Daily  . carvedilol  6.25 mg Oral BID WC  . docusate sodium  100 mg Oral Q12H  . enoxaparin (LOVENOX) injection  40 mg Subcutaneous Q24H  . furosemide  40 mg Oral Daily  . insulin aspart  0-15 Units Subcutaneous TID WC  . insulin aspart  0-5 Units Subcutaneous QHS  . insulin aspart  3 Units Subcutaneous TID WC  . insulin glargine  10 Units Subcutaneous Q2200   . lisinopril  20 mg Oral Daily  . metFORMIN  500 mg Oral Q breakfast  . pantoprazole  40 mg Oral QAC breakfast  . pravastatin  40 mg Oral Daily   Continuous Infusions:   Time Spent: 25 min   Marinda Elk  [facility] Pager [phone]. If 7PM-7AM, please contact night-coverage at [email or web address], password [facility] 12/23/2014, 8:22 AM  LOS: 2 days

## 2014-12-23 NOTE — Clinical Social Work Note (Signed)
Clinical Social Work Assessment  Patient Details  Name: Mckenzie Williamson MRN: [id number] Date of Birth: 27-Aug-1932  Date of referral:  12/23/14               Reason for consult:  Facility Placement                Permission sought to share information with:  Facility Medical sales representative, Family Supports Permission granted to share information::  Yes, Verbal Permission Granted  Name::     Wynn Banker (daughter)  Agency::  [facility] SNFs for referral purposes  Relationship::     Contact Information:     Housing/Transportation Living arrangements for the past 2 months:  Single Family Home Source of Information:  Patient, Adult Children Patient Interpreter Needed:  None Criminal Activity/Legal Involvement Pertinent to Current Situation/Hospitalization:  No - Comment as needed Significant Relationships:  Adult Children Lives with:   Alone Do you feel safe going back to the place where you live?  Yes Need for family participation in patient care:  No (Coment) (Pt requests her daughter be involved)  Care giving concerns:  PT recommendation for SNF and pt family in agreement   Social Worker assessment / plan:  CSW visited pt room to discuss SNF recommendation. Pt aware of recommendation and agreeable. During conversation pt daughter from [place] arrived. Pt tearful and happy that her daughter is now here. CSW introduced self to pt daughter and explained what we were discussing. Pt and pt daughter made aware of SNF referral process. They are also aware that decision may need to be made quickly and while CSW cannot make recommendation CSW is available to assist as needed. They are agreeable to referral being sent to all [facility]. Pt daughter informed CSW that she is actually in the process of making arrangements for pt to move to [place] with her. She is pleased that pt will be in a safe location (SNF) while she finalizes details. Plan will be for pt to dc from SNF to  [place] with her daughter. Pt daughter requesting to speak with MD if possible. CSW paged MD to notify.  Employment status:  Retired Database administrator PT Recommendations:  Skilled Nursing Facility Information / Referral to community resources:  Skilled Nursing Facility  Patient/Family's Response to care:  Pt and pt daughter in agreement that SNF will be safest dc plan.  Patient/Family's Understanding of and Emotional Response to Diagnosis, Current Treatment, and Prognosis:  Pt daughter has a good but limited understanding of pt condition. Limited understanding only because she is just arriving from out of state.  Emotional Assessment Appearance:  Appears stated age, Well-Groomed Attitude/Demeanor/Rapport:  Other (Cooperative) Affect (typically observed):  Calm, Happy, Pleasant Orientation:  Oriented to Self, Oriented to Place, Oriented to  Time, Oriented to Situation Alcohol / Substance use:  Not Applicable Psych involvement (Current and /or in the community):  No (Comment)  Discharge Needs  Concerns to be addressed:  Discharge Planning Concerns Readmission within the last 30 days:  No Current discharge risk:  Dependent with Mobility Barriers to Discharge:  Continued Medical Work up   [employer] [phone]

## 2014-12-23 NOTE — Clinical Social Work Placement (Signed)
   CLINICAL SOCIAL WORK PLACEMENT  NOTE  Date:  12/23/2014  Patient Details  Name: Mckenzie Williamson MRN: [id number] Date of Birth: [birth year]  Clinical Social Work is seeking post-discharge placement for this patient at the Skilled  Nursing Facility level of care (*CSW will initial, date and re-position this form in  chart as items are completed):  Yes   Patient/family provided with [facility] [place] Clinical Social Work Department's list of facilities offering this level of care within the geographic area requested by the patient (or if unable, by the patient's family).  Yes   Patient/family informed of their freedom to choose among providers that offer the needed level of care, that participate in Medicare, Medicaid or managed care program needed by the patient, have an available bed and are willing to accept the patient.  Yes   Patient/family informed of [facility]'s ownership interest in [facility] and [facility], as well as of the fact that they are under no obligation to receive care at these facilities.  PASRR submitted to EDS on 12/23/14     PASRR number received on 12/23/14     Existing PASRR number confirmed on       FL2 transmitted to all facilities in geographic area requested by pt/family on 12/23/14     FL2 transmitted to all facilities within larger geographic area on       Patient informed that his/her managed care company has contracts with or will negotiate with certain facilities, including the following:        Yes   Patient/family informed of bed offers received.  Patient chooses bed at       Physician recommends and patient chooses bed at      Patient to be transferred to   on  .  Patient to be transferred to facility by       Patient family notified on   of transfer.  Name of family member notified:        PHYSICIAN Please sign FL2, Please prepare priority discharge summary, including medications, Please prepare prescriptions      Additional Comment:    _______________________________________________ Mckenzie Williamson, Mckenzie Williamson [phone]

## 2014-12-23 NOTE — Progress Notes (Signed)
PT Cancellation Note  Patient Details Name: Mckenzie Williamson MRN: [id number] DOB: 06/09/1932   Cancelled Treatment:    Reason Eval/Treat Not Completed: Patient at procedure or test/unavailable   Fabio Asa 12/23/2014, 9:52 AM Charlotte Crumb, PT DPT  [phone]

## 2014-12-24 ENCOUNTER — Telehealth: Payer: Self-pay | Admitting: Nurse Practitioner

## 2014-12-24 DIAGNOSIS — E876 Hypokalemia: Secondary | ICD-10-CM

## 2014-12-24 DIAGNOSIS — I10 Essential (primary) hypertension: Secondary | ICD-10-CM

## 2014-12-24 DIAGNOSIS — R0789 Other chest pain: Secondary | ICD-10-CM

## 2014-12-24 DIAGNOSIS — I5023 Acute on chronic systolic (congestive) heart failure: Secondary | ICD-10-CM

## 2014-12-24 DIAGNOSIS — R079 Chest pain, unspecified: Secondary | ICD-10-CM

## 2014-12-24 LAB — GLUCOSE, CAPILLARY
GLUCOSE-CAPILLARY: 200 mg/dL — AB (ref 65–99)
Glucose-Capillary: 215 mg/dL — ABNORMAL HIGH (ref 65–99)

## 2014-12-24 MED ORDER — ACETAMINOPHEN 325 MG PO TABS: 650.0000 mg | ORAL_TABLET | ORAL | Status: AC | PRN

## 2014-12-24 MED ORDER — POLYETHYLENE GLYCOL 3350 17 G PO PACK: 17.0000 g | PACK | Freq: Every day | ORAL | Status: AC | PRN

## 2014-12-24 MED ORDER — FUROSEMIDE 20 MG PO TABS: 40.0000 mg | ORAL_TABLET | Freq: Every day | ORAL | Status: DC

## 2014-12-24 MED ORDER — PANTOPRAZOLE SODIUM 40 MG PO TBEC: 40.0000 mg | DELAYED_RELEASE_TABLET | Freq: Every day | ORAL | Status: DC

## 2014-12-24 MED ORDER — POTASSIUM CHLORIDE CRYS ER 20 MEQ PO TBCR: 20.0000 meq | EXTENDED_RELEASE_TABLET | Freq: Every day | ORAL | Status: AC

## 2014-12-24 MED ORDER — TRAMADOL HCL 50 MG PO TABS
50.0000 mg | ORAL_TABLET | Freq: Four times a day (QID) | ORAL | Status: DC | PRN
Start: 2014-12-24 — End: 2015-05-31

## 2014-12-24 MED ORDER — INSULIN ASPART 100 UNIT/ML ~~LOC~~ SOLN: SUBCUTANEOUS | Status: DC

## 2014-12-24 MED ORDER — CARVEDILOL 6.25 MG PO TABS: 6.2500 mg | ORAL_TABLET | Freq: Two times a day (BID) | ORAL | Status: DC

## 2014-12-24 NOTE — Progress Notes (Signed)
Pt a/forgetful, vss, pt stable, pt being dc to [facility] via EMS, report called,

## 2014-12-24 NOTE — Telephone Encounter (Signed)
Mckenzie Williamson's daughter Wynn Banker dropped off a FMLA form to be filled out by Abbey Chatters. The form was placed in the rx tray for [facility].

## 2014-12-24 NOTE — Discharge Summary (Signed)
PATIENT DETAILS Name: Mckenzie Williamson Age: 81 y.o. Sex: female Date of Birth: 07-Jan-1933 MRN: [id number]. Admitting Physician: Gennaro Africa, MD JSE:[id number], Carlos American, NP  Admit Date: 12/18/2014 Discharge date: 12/24/2014  Recommendations for Outpatient Follow-up:  1. Has right hip pain and difficulty with ambulation for the past 3 months if not more-x-ray of the right hip shows questionable lytic lesions that have essentially been unchanged when compared to prior CT abdomen. SPEP/UPEP drawn and pending at the time of discharge. Will likely need a nuclear bone scan-deferred to the outpatient setting. Patient will likely need evaluation by orthopedics-deferred to the outpatient setting  2. Please follow SPEP/UPEP 3. Please check electrolytes in one week-on Lasix.   PRIMARY DISCHARGE DIAGNOSIS:  Active Problems:   Controlled diabetes mellitus type II without complication   Essential hypertension   Chronic systolic congestive heart failure, NYHA class 2   Chest pain   Hypokalemia   Acute systolic congestive heart failure, NYHA class 2   Acute on chronic systolic congestive heart failure      PAST MEDICAL HISTORY: Past Medical History  Diagnosis Date  . Coronary artery disease     s/p CABG in 2005; last cath in 2008 showing occlusion of all SVG's;   . Diabetes mellitus   . Hyperlipidemia   . Hypertension   . LV dysfunction     EF 30 to 35% per echo 3/13  . Dizzy   . Hypoglycemia   . Cataract   . CHF (congestive heart failure)   . Non compliance w medication regimen   . Ischemic cardiomyopathy   . Memory difficulty     DISCHARGE MEDICATIONS: Current Discharge Medication List    START taking these medications   Details  acetaminophen (TYLENOL) 325 MG tablet Take 2 tablets (650 mg total) by mouth every 4 (four) hours as needed for headache or mild pain.    carvedilol (COREG) 6.25 MG tablet Take 1 tablet (6.25 mg total) by mouth 2 (two) times daily with a meal.      insulin aspart (NOVOLOG) 100 UNIT/ML injection 0-15 Units, Subcutaneous, 3 times daily with meals CBG < 70: implement hypoglycemia protocol CBG 70 - 120: 0 units CBG 121 - 150: 2 units CBG 151 - 200: 3 units CBG 201 - 250: 5 units CBG 251 - 300: 8 units CBG 301 - 350: 11 units CBG 351 - 400: 15 units CBG > 400: call MD    pantoprazole (PROTONIX) 40 MG tablet Take 1 tablet (40 mg total) by mouth daily before breakfast.    traMADol (ULTRAM) 50 MG tablet Take 1 tablet (50 mg total) by mouth every 6 (six) hours as needed. Qty: 20 tablet, Refills: 0      CONTINUE these medications which have CHANGED   Details  furosemide (LASIX) 20 MG tablet Take 2 tablets (40 mg total) by mouth daily.   Associated Diagnoses: Essential hypertension; Chronic congestive heart failure, unspecified congestive heart failure type    polyethylene glycol (MIRALAX) packet Take 17 g by mouth daily as needed for mild constipation. Qty: 14 each, Refills: 0    potassium chloride SA (K-DUR,KLOR-CON) 20 MEQ tablet Take 1 tablet (20 mEq total) by mouth daily.      CONTINUE these medications which have NOT CHANGED   Details  aspirin (EQ ASPIRIN) 325 MG EC tablet Take 1 tablet (325 mg total) by mouth daily. Qty: 30 tablet, Refills: 2   Associated Diagnoses: Coronary artery disease with unspecified angina pectoris  docusate sodium (COLACE) 100 MG capsule Take 1 capsule (100 mg total) by mouth every 12 (twelve) hours. Qty: 60 capsule, Refills: 0    Insulin Glargine (LANTUS SOLOSTAR) 100 UNIT/ML Solostar Pen Inject 10 Units into the skin daily at 10 pm. Qty: 5 pen, Refills: 0    lisinopril (PRINIVIL,ZESTRIL) 10 MG tablet Take 1 tablet (10 mg total) by mouth daily. Qty: 30 tablet, Refills: 3   Associated Diagnoses: Essential hypertension    metFORMIN (GLUCOPHAGE) 500 MG tablet Take 1 tablet (500 mg total) by mouth daily with breakfast. Qty: 30 tablet, Refills: 3   Associated Diagnoses: Type 2 diabetes  mellitus not at goal    Multiple Vitamin (MULTIVITAMIN) tablet Take 1 tablet by mouth daily.      pravastatin (PRAVACHOL) 40 MG tablet Take 1 tablet (40 mg total) by mouth daily. Qty: 30 tablet, Refills: 3   Associated Diagnoses: Hyperlipidemia      STOP taking these medications     AMBULATORY NON FORMULARY MEDICATION      Blood Glucose Monitoring Suppl (ONE TOUCH ULTRA SYSTEM KIT) W/DEVICE KIT      glucose blood (ONE TOUCH ULTRA TEST) test strip      Insulin Pen Needle 31G X 5 MM MISC      ONETOUCH DELICA LANCETS FINE MISC         ALLERGIES:  No Known Allergies  BRIEF HPI:  See H&P, Labs, Consult and Test reports for all details in brief, patient  is a 81 y.o. female with history of CAD s/p CABG in 2005 with [place] in 2008, DM, HLD, HTN who was brought to the ED for evaluation of chest pain.   CONSULTATIONS:   None  PERTINENT RADIOLOGIC STUDIES: Dg Chest 2 View  12/19/2014   CLINICAL DATA:  81 year old female with chest pain  EXAM: CHEST  2 VIEW  COMPARISON:  Chest radiograph dated 10/05/2011  FINDINGS: Two views of the chest demonstrate clear lungs. There is no focal consolidation, pleural effusion, or pneumothorax. Stable cardiomegaly. Median sternotomy wires and CABG clips noted. Degenerate changes of the spine.  IMPRESSION: No active cardiopulmonary disease.  Stable cardiomegaly.   Electronically Signed   By: Anner Crete M.D.   On: 12/19/2014 01:04   Dg Knee 1-2 Views Right  12/23/2014   CLINICAL DATA:  Unable to straighten leg.  No injury.  EXAM: RIGHT KNEE - 1-2 VIEW  COMPARISON:  None.  FINDINGS: The knees slightly externally rotated. There is minimal decreased bone mineralization. Minimal degenerative change present. No evidence of fracture dislocation. No significant joint effusion. Atherosclerotic disease is present. Several surgical clips over the posterior soft tissues of the knee and upper leg.  IMPRESSION: No acute findings.   Electronically Signed   By: Marin Olp M.D.   On: 12/23/2014 10:12   Dg Hip Unilat With Pelvis 2-3 Views Right  12/23/2014   CLINICAL DATA:  Unable to straighten the leg after sleeping wrong last night, no known injury  EXAM: DG HIP (WITH OR WITHOUT PELVIS) 2-3V RIGHT  COMPARISON:  Abdominal and pelvic CT scan dated November 10, 2014 and April 07, 2014.  FINDINGS: The bones are osteopenic. There is no acute bony abnormality of the pelvis. There are degenerative changes of the lower lumbar spine. AP and lateral views of the right hip reveal mild narrowing of the medial aspect of the joint space. The femoral head remains smoothly rounded as does the acetabular surface. The femoral neck and intertrochanteric regions exhibit no acute  abnormalities. Lucencies were visible in the femoral heads on the recent CT scan but are not clearly evident on today's plain radiographic study.  IMPRESSION: There is mild degenerative narrowing of the right hip joint space. No acute fracture or dislocation is observed. There are lytic areas demonstrated on the previous CT scans involving in the femoral heads greater on the right than on the left.  Nuclear bone scanning is recommended.   Electronically Signed   By: David  Martinique M.D.   On: 12/23/2014 10:11     PERTINENT LAB RESULTS: CBC: No results for input(s): WBC, HGB, HCT, PLT in the last 72 hours. CMET CMP     Component Value Date/Time   NA 140 12/22/2014 0330   NA 146* 12/14/2014 1609   K 4.0 12/22/2014 0330   CL 101 12/22/2014 0330   CO2 32 12/22/2014 0330   GLUCOSE 179* 12/22/2014 0330   GLUCOSE 152* 12/14/2014 1609   BUN 14 12/22/2014 0330   BUN 13 12/14/2014 1609   CREATININE 0.97 12/22/2014 0330   CALCIUM 9.1 12/22/2014 0330   PROT 6.6 11/10/2014 0615   PROT 7.1 10/05/2014 1131   ALBUMIN 2.9* 11/10/2014 0615   AST 16 11/10/2014 0615   ALT 11* 11/10/2014 0615   ALKPHOS 73 11/10/2014 0615   BILITOT 0.7 11/10/2014 0615   BILITOT 0.2 10/05/2014 1131   GFRNONAA 54* 12/22/2014 0330    GFRAA >60 12/22/2014 0330    GFR Estimated Creatinine Clearance: 40.4 mL/min (by C-G formula based on Cr of 0.97). No results for input(s): LIPASE, AMYLASE in the last 72 hours. No results for input(s): CKTOTAL, CKMB, CKMBINDEX, TROPONINI in the last 72 hours. Invalid input(s): POCBNP No results for input(s): DDIMER in the last 72 hours. No results for input(s): HGBA1C in the last 72 hours. No results for input(s): CHOL, HDL, LDLCALC, TRIG, CHOLHDL, LDLDIRECT in the last 72 hours. No results for input(s): TSH, T4TOTAL, T3FREE, THYROIDAB in the last 72 hours.  Invalid input(s): FREET3 No results for input(s): VITAMINB12, FOLATE, FERRITIN, TIBC, IRON, RETICCTPCT in the last 72 hours. Coags: No results for input(s): INR in the last 72 hours.  Invalid input(s): PT Microbiology: No results found for this or any previous visit (from the past 240 hour(s)).   BRIEF HOSPITAL COURSE:   Active Problems: Chest pain: Resolved. Felt to be atypical and most likely GI in etiology. Started on PPI with no further recurrence of chest pain. 2-D echocardiogram shows EF around 35-40% with hypokinesis of the entire myocardium (essentially unchanged from prior)  Acute on chronic systolic heart failure: Managed with IV Lasix. Clinically compensated at the time of discharge. Continue oral Lasix on discharge. Weight down 239 pounds on discharge.  Hypokalemia: Likely secondary to Lasix. Repleted. Potassium normal at the time of discharge. Please check electrolytes in 1 week. On KCl supplementation  Right hip pain: This M.D. spoke with patient and daughter at bedside-although patient is a poor historian-she does acknowledge that this is ongoing for at least 3 months if not longer, this is confirmed by daughter at bedside. X-ray of the right hip are negative for fracture-however it does show degenerative narrowing of the right hip joint space, there is some lytic areas that was demonstrated in previous CAT scan in  June 2016. Since this is a chronic issue, suspect this can be further worked up in the outpatient setting. I have ordered a SPEP/UPEP will be pending at the time of discharge. Patient will need a nuclear medicine bone scan for  further workup. If SPEP/UPEP and new care medicine bone scan is negative, patient will need orthopedic evaluation. Obviously if SPEP/UPEP was positive then she will need referral to oncology. I have discussed this with patient's daughter at bedside who is agreeable with this plan and she will follow up with the physician at the skilled nursing facility. Patient's daughter eventually wants to take her to [place] with her.  Type 2 diabetes: Continue metformin and usual dosing of insulin. Further optimization deferred to the outpatient setting  Essential hypertension: Continue Coreg and this an appropriate. Further optimization deferred to the outpatient setting  TODAY-DAY OF DISCHARGE:  Subjective:   Mckenzie Williamson today has no headache,no chest abdominal pain,no new weakness tingling or numbness, feels much better. She denies any chest pain or shortness of breath this morning. Right hip pain has been essentially unchanged for the past few months  Objective:   Blood pressure 126/53, pulse 80, temperature 98.4 F (36.9 C), temperature source Oral, resp. rate 18, height _0  (1.575 m), weight 63.1 kg (139 lb 1.8 oz), SpO2 97 %.  Intake/Output Summary (Last 24 hours) at 12/24/14 1206 Last data filed at 12/24/14 0858  Gross per 24 hour  Intake    840 ml  Output   1150 ml  Net   -310 ml   Filed Weights   12/22/14 0606 12/23/14 0451 12/24/14 0458  Weight: 61.866 kg (136 lb 6.2 oz) 62.3 kg (137 lb 5.6 oz) 63.1 kg (139 lb 1.8 oz)    Exam Awake Alert, Oriented *3, No new F.N deficits, Normal affect [place].AT,PERRAL Supple Neck,No JVD, No cervical lymphadenopathy appriciated.  Symmetrical Chest wall movement, Good air movement bilaterally, CTAB RRR,No Gallops,Rubs or new  Murmurs, No Parasternal Heave +ve B.Sounds, Abd Soft, Non tender, No organomegaly appriciated, No rebound -guarding or rigidity. No Cyanosis, Clubbing or edema, No new Rash or bruise  DISCHARGE CONDITION: Stable  DISPOSITION: SNF  DISCHARGE INSTRUCTIONS:    Activity:  As tolerated with Full fall precautions use walker/cane & assistance as needed  Diet recommendation: Diabetic Diet Heart Healthy diet   Discharge Instructions    Call MD for:  severe uncontrolled pain    Complete by:  As directed      Diet - low sodium heart healthy    Complete by:  As directed      Diet Carb Modified    Complete by:  As directed      Increase activity slowly    Complete by:  As directed            Follow-up Information    Follow up with Dewaine Oats, JESSICA K, NP. Schedule an appointment as soon as possible for a visit in 1 week.   Specialty:  Nurse Practitioner   Contact information:   [address] [place] [address] [phone]      Total Time spent on discharge equals  45 minutes.  SignedOren Binet 12/24/2014 12:06 PM

## 2014-12-24 NOTE — Telephone Encounter (Signed)
Received FMLA paper's for daughter, Wynn Banker to take time off to get her mom, patient, moved and provide rehabilitation and health and hygiene assistance. #:[phone]. 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

## 2014-12-24 NOTE — Progress Notes (Signed)
Inpatient Diabetes Program Recommendations  AACE/ADA: New Consensus Statement on Inpatient Glycemic Control (2013)  Target Ranges:  Prepandial:   less than 140 mg/dL      Peak postprandial:   less than 180 mg/dL (1-2 hours)      Critically ill patients:  140 - 180 mg/dL   Reason for Visit: Hyperglycemia  Results for Mckenzie Williamson, Mckenzie Williamson (MRN [id number]) as of 12/24/2014 12:32  Ref. Range 12/23/2014 11:43 12/23/2014 16:47 12/23/2014 22:00 12/24/2014 06:36 12/24/2014 11:37  Glucose-Capillary Latest Ref Range: 65-99 mg/dL 409 (H) 811 (H) 914 (H) 200 (H) 215 (H)    Inpatient Diabetes Program Recommendations Insulin - Basal: Increase Lantus to 12 units QHS Insulin - Meal Coverage: Increase meal coverage to Novolog 4 units tidwc  Note: Will continue to follow. Thank you. Ailene Ards, RD, LDN, CDE Inpatient Diabetes Coordinator [phone]

## 2014-12-27 LAB — PROTEIN ELECTROPHORESIS, SERUM
A/G Ratio: 0.8 (ref 0.7–1.7)
ALBUMIN ELP: 2.7 g/dL — AB (ref 2.9–4.4)
ALPHA-1-GLOBULIN: 0.3 g/dL (ref 0.0–0.4)
Alpha-2-Globulin: 0.8 g/dL (ref 0.4–1.0)
Beta Globulin: 1.2 g/dL (ref 0.7–1.3)
GAMMA GLOBULIN: 1 g/dL (ref 0.4–1.8)
Globulin, Total: 3.4 g/dL (ref 2.2–3.9)
Total Protein ELP: 6.1 g/dL (ref 6.0–8.5)

## 2014-12-30 LAB — UIFE/LIGHT CHAINS/TP QN, 24-HR UR
% BETA, Urine: 31 %
ALPHA 1 URINE: 0.5 %
Albumin, U: 57.1 %
Alpha 2, Urine: 6.7 %
FREE KAPPA/LAMBDA RATIO: 7.29 (ref 2.04–10.37)
FREE LAMBDA LT CHAINS, UR: 3.77 mg/L (ref 0.24–6.66)
Free Lt Chn Excr Rate: 27.5 mg/L — ABNORMAL HIGH (ref 1.35–24.19)
GAMMA GLOBULIN URINE: 4.6 %
Total Protein, Urine: 12.3 mg/dL

## 2014-12-31 ENCOUNTER — Ambulatory Visit: Payer: Medicare Other | Admitting: Internal Medicine

## 2015-01-05 NOTE — Telephone Encounter (Signed)
Per Sandre Kitty no showed for appointment. Patient needs appointment before paperwork can be filled out.  Tried calling the daughter, Wynn Banker #[phone] [facility] for patient to schedule an appointment in order to fill out forms.

## 2015-01-06 NOTE — Telephone Encounter (Signed)
Mckenzie Williamson, daughter called [phone] and left message on voicemail that she needed paperwork filled out. She needed the forms completed for her.  I tried called her back and had to leave message and stated that patient needed an appointment for forms to be completed per Dr. Montez Morita and Shanda Bumps. And gave her the phone number to call back to schedule

## 2015-01-06 NOTE — Telephone Encounter (Signed)
I left message on voicemail informing patient's daughter that we need her to call and reschedule missed appointment to complete paperwork.  Form place in my basket Synetta Fail and Jasmine December are aware of form location)

## 2015-01-18 ENCOUNTER — Ambulatory Visit: Payer: Medicare Other | Admitting: Nurse Practitioner

## 2015-01-22 NOTE — Clinical Social Work Placement (Addendum)
   CLINICAL SOCIAL WORK PLACEMENT  NOTE  Date:  01/22/2015  Patient Details  Name: Mckenzie Williamson MRN: [id number] Date of Birth: [birth year]  Clinical Social Work is seeking post-discharge placement for this patient at the Skilled  Nursing Facility level of care (*CSW will initial, date and re-position this form in  chart as items are completed):  Yes   Patient/family provided with [facility] Clinical Social Work Department's list of facilities offering this level of care within the geographic area requested by the patient (or if unable, by the patient's family).  Yes   Patient/family informed of their freedom to choose among providers that offer the needed level of care, that participate in Medicare, Medicaid or managed care program needed by the patient, have an available bed and are willing to accept the patient.  Yes   Patient/family informed of [facility]'s ownership interest in [facility] and [facility], as well as of the fact that they are under no obligation to receive care at these facilities.  PASRR submitted to EDS on 12/23/14     PASRR number received on 12/23/14     Existing PASRR number confirmed on       FL2 transmitted to all facilities in geographic area requested by pt/family on 12/23/14     FL2 transmitted to all facilities within larger geographic area on       Patient informed that his/her managed care company has contracts with or will negotiate with certain facilities, including the following:        Yes   Patient/family informed of bed offers received.  Patient chooses bed at [facility]     Physician recommends and patient chooses bed at      Patient to be transferred to [facility] on 12/24/14.  Patient to be transferred to facility by Ambulance Sharin Mons)     Patient family notified on 12/24/14 of transfer.  Name of family member notified:  Daughter- [name], Son- [name]     PHYSICIAN Please sign FL2, Please  prepare priority discharge summary, including medications, Please prepare prescriptions     Additional Comment: Ok per MD for d/c today to SNF. Nursing notified to call report to SNF and packet prepared.  Patient and family are agreeable to d/c to available bed at SNF.  No further CSW needs identified.CSW signing off.  Lorri Frederick. Andria Rhein [phone]        _______________________________________________ Lovette Cliche T, LCSW 12/24/2014  4:00PM

## 2015-01-25 ENCOUNTER — Ambulatory Visit (INDEPENDENT_AMBULATORY_CARE_PROVIDER_SITE_OTHER): Payer: Medicare Other | Admitting: Nurse Practitioner

## 2015-01-25 ENCOUNTER — Encounter: Payer: Self-pay | Admitting: Nurse Practitioner

## 2015-01-25 VITALS — BP 108/56 | HR 59 | Temp 98.1°F | Resp 20 | Ht 62.0 in | Wt 140.2 lb

## 2015-01-25 DIAGNOSIS — I509 Heart failure, unspecified: Secondary | ICD-10-CM

## 2015-01-25 DIAGNOSIS — E119 Type 2 diabetes mellitus without complications: Secondary | ICD-10-CM | POA: Diagnosis not present

## 2015-01-25 DIAGNOSIS — K5901 Slow transit constipation: Secondary | ICD-10-CM

## 2015-01-25 DIAGNOSIS — I25119 Atherosclerotic heart disease of native coronary artery with unspecified angina pectoris: Secondary | ICD-10-CM | POA: Diagnosis not present

## 2015-01-25 DIAGNOSIS — I10 Essential (primary) hypertension: Secondary | ICD-10-CM | POA: Diagnosis not present

## 2015-01-25 DIAGNOSIS — R5381 Other malaise: Secondary | ICD-10-CM

## 2015-01-25 NOTE — Patient Instructions (Signed)
If you moving this weekend need to set up appt with primary care doctor once you move, otherwise follow up with our office in 6 weeks  FMLA paperwork complete.

## 2015-01-25 NOTE — Progress Notes (Signed)
Patient ID: Mckenzie Williamson, female   DOB: November 27, 1932, 81 y.o.   MRN: [id number]    PCP: Sharon Seller, NP  No Known Allergies  Chief Complaint  Patient presents with  . Medical Management of Chronic Issues    To have FLMA form filled     HPI: Patient is a 81 y.o. female seen in the office today to fill out FMLA paperwork.  Hospitalized in July and was at [facility] until today she was discharged.  Pts daughter from [place] plans to comes down this weekend. Plan is for her to move her back with her. Has looked into PCP in her area.  Currently sons are taking care of her.   Noted for right hip pain in hospital, reports this is better at this time.  No shortness of breath or chest pains. Swelling in lower legs improved.  Eating well. Son who is not here manages insulin.  Would like to request transfer from [facility] to [place]   Review of Systems:  Review of Systems  Constitutional: Negative for appetite change, fatigue and unexpected weight change.  Respiratory: Negative for shortness of breath.   Cardiovascular: Negative for chest pain, palpitations and leg swelling.  Gastrointestinal: Negative for diarrhea and constipation.  Genitourinary: Negative for dysuria, frequency and difficulty urinating.       Incontinence of urine   Musculoskeletal: Negative for myalgias and arthralgias.  Neurological: Positive for weakness.  Psychiatric/Behavioral: Positive for confusion.    Past Medical History  Diagnosis Date  . Coronary artery disease     s/p CABG in 2005; last cath in 2008 showing occlusion of all SVG's;   . Diabetes mellitus   . Hyperlipidemia   . Hypertension   . LV dysfunction     EF 30 to 35% per echo 3/13  . Dizzy   . Hypoglycemia   . Cataract   . CHF (congestive heart failure)   . Non compliance w medication regimen   . Ischemic cardiomyopathy   . Memory difficulty    Past Surgical History  Procedure Laterality Date  .  Coronary artery bypass graft  2005    THIS INCLUDED SAPHENOUS VEIN GRAFT TO THE PDA, SAPHENOUS VEIN GRAFT TO OBTUSE MARGINAL VESSEL, AS LIMA GRAFT TO THE LAD.  Marland Kitchen Cardiac catheterization  2008    EF 50-55%. SHOWED OCCLUSION OF VEIN GRAFT  . Cholecystectomy    . Transthoracic echocardiogram  05/04/2004    EF 50-55%  . Cardiovascular stress test  12/24/2006    EF 47%   Social History:   reports that she has never smoked. She has never used smokeless tobacco. She reports that she does not drink alcohol or use illicit drugs.  Family History  Problem Relation Age of Onset  . Tuberculosis Mother   . Lung cancer Father   . Diabetes Sister   . Heart attack Brother   . Hypertension Brother   . Diabetes Brother     Medications: Patient's Medications  New Prescriptions   No medications on file  Previous Medications   ACETAMINOPHEN (TYLENOL) 325 MG TABLET    Take 2 tablets (650 mg total) by mouth every 4 (four) hours as needed for headache or mild pain.   ASPIRIN (EQ ASPIRIN) 325 MG EC TABLET    Take 1 tablet (325 mg total) by mouth daily.   CARVEDILOL (COREG) 6.25 MG TABLET    Take 1 tablet (6.25 mg total) by mouth 2 (two) times daily with a  meal.   DOCUSATE SODIUM (COLACE) 100 MG CAPSULE    Take 1 capsule (100 mg total) by mouth every 12 (twelve) hours.   FUROSEMIDE (LASIX) 20 MG TABLET    Take 2 tablets (40 mg total) by mouth daily.   INSULIN ASPART (NOVOLOG) 100 UNIT/ML INJECTION    0-15 Units, Subcutaneous, 3 times daily with meals CBG < 70: implement hypoglycemia protocol CBG 70 - 120: 0 units CBG 121 - 150: 2 units CBG 151 - 200: 3 units CBG 201 - 250: 5 units CBG 251 - 300: 8 units CBG 301 - 350: 11 units CBG 351 - 400: 15 units CBG > 400: call MD   INSULIN GLARGINE (LANTUS SOLOSTAR) 100 UNIT/ML SOLOSTAR PEN    Inject 10 Units into the skin daily at 10 pm.   LISINOPRIL (PRINIVIL,ZESTRIL) 10 MG TABLET    Take 1 tablet (10 mg total) by mouth daily.   METFORMIN (GLUCOPHAGE) 500 MG  TABLET    Take 1 tablet (500 mg total) by mouth daily with breakfast.   MULTIPLE VITAMIN (MULTIVITAMIN) TABLET    Take 1 tablet by mouth daily.     PANTOPRAZOLE (PROTONIX) 40 MG TABLET    Take 1 tablet (40 mg total) by mouth daily before breakfast.   POLYETHYLENE GLYCOL (MIRALAX) PACKET    Take 17 g by mouth daily as needed for mild constipation.   POTASSIUM CHLORIDE SA (K-DUR,KLOR-CON) 20 MEQ TABLET    Take 1 tablet (20 mEq total) by mouth daily.   PRAVASTATIN (PRAVACHOL) 40 MG TABLET    Take 1 tablet (40 mg total) by mouth daily.   TRAMADOL (ULTRAM) 50 MG TABLET    Take 1 tablet (50 mg total) by mouth every 6 (six) hours as needed.  Modified Medications   No medications on file  Discontinued Medications   No medications on file     Physical Exam:  Filed Vitals:   01/25/15 0955  BP: 108/56  Pulse: 59  Temp: 98.1 F (36.7 C)  TempSrc: Oral  Resp: 20  Height: 5\' 2"  (1.575 m)  Weight: 140 lb 3.2 oz (63.594 kg)  SpO2: 97%    Physical Exam  Constitutional: She appears well-developed and well-nourished.  HENT:  Mouth/Throat: Oropharynx is clear and moist. No oropharyngeal exudate.  Eyes: Conjunctivae and EOM are normal. Pupils are equal, round, and reactive to light.  Neck: Normal range of motion. Neck supple.  Cardiovascular: Normal rate, regular rhythm and normal heart sounds.   Pulmonary/Chest: Effort normal and breath sounds normal.  Abdominal: Soft. Bowel sounds are normal.  Musculoskeletal: She exhibits edema (trace).  Neurological: She is alert.  Skin: Skin is warm and dry.  Psychiatric: Her affect is blunt. Cognition and memory are impaired.    Labs reviewed: Basic Metabolic Panel:  Recent Labs  81/19/14 1131  12/19/14 0840 12/21/14 0323 12/22/14 0330  NA 139  < > 141 139 140  K 4.3  < > 3.5 4.4 4.0  CL 102  < > 103 100* 101  CO2 19  < > 30 31 32  GLUCOSE 306*  < > 244* 215* 179*  BUN 20  < > 9 10 14   CREATININE 0.92  < > 0.73 0.89 0.97  CALCIUM 9.4  < >  8.5* 8.8* 9.1  TSH 1.500  --   --   --   --   < > = values in this interval not displayed. Liver Function Tests:  Recent Labs  04/06/14 0830 04/07/14 1145 10/05/14  1131 11/10/14 0615  AST ALT 11*  ALKPHOS 124* 108 104 73  BILITOT 0.5 0.5 0.2 0.7  PROT 6.6 6.9 7.1 6.6  ALBUMIN 3.0* 3.2*  --  2.9*    Recent Labs  04/06/14 0830 04/07/14 1145  LIPASE 59 35   No results for input(s): AMMONIA in the last 8760 hours. CBC:  Recent Labs  04/07/14 1348  10/12/14 1053 11/10/14 0615 12/19/14 0102  WBC 5.0  < > 8.6 8.7 8.8  NEUTROABS 2.9  < > 6.3 4.8 4.9  HGB 12.4  --   --  10.7* 10.5*  HCT 38.1  < > 35.1 33.8* 32.7*  MCV 84.9  --   --  80.9 81.1  PLT 250  --   --  409* 266  < > = values in this interval not displayed. Lipid Panel: No results for input(s): CHOL, HDL, LDLCALC, TRIG, CHOLHDL, LDLDIRECT in the last 8760 hours. TSH:  Recent Labs  10/05/14 1131  TSH 1.500   A1C: Lab Results  Component Value Date   HGBA1C 7.4* 12/19/2014     Assessment/Plan 1. Essential hypertension -blood pressure stable, conts on lisinopril 10 mg daily and coreg twice daily   2. Chronic congestive heart failure, unspecified congestive heart failure type euvolemic at this time, conts on lasix 20 mg daily with potassium supplements. conts on lisinopril and coreg  3. Type 2 diabetes mellitus not at goal A1c 7.4 in July, currently taking lantus 10 units with SSI novolog which was added in hospital. [facility] nursing to follow up with pt at home  4. Coronary artery disease with unspecified angina pectoris -without chest pains. conts on ASA 325 mg.   5. Slow transit constipation -improved, to cont on colace 100 mg daily and miralax daily as needed   6. Debility -discharged from rehab today, doing good. home health PT/OT has been ordered since discharge from facility  -FMLA paperwork filled out   [place] K. Biagio Borg  [facility] [phone] 8 am - 5 pm) [phone] (after hours)

## 2015-01-27 DIAGNOSIS — I10 Essential (primary) hypertension: Secondary | ICD-10-CM | POA: Diagnosis not present

## 2015-01-27 DIAGNOSIS — M6281 Muscle weakness (generalized): Secondary | ICD-10-CM | POA: Diagnosis not present

## 2015-01-27 DIAGNOSIS — I5022 Chronic systolic (congestive) heart failure: Secondary | ICD-10-CM | POA: Diagnosis not present

## 2015-01-27 DIAGNOSIS — E119 Type 2 diabetes mellitus without complications: Secondary | ICD-10-CM | POA: Diagnosis not present

## 2015-02-02 ENCOUNTER — Telehealth: Payer: Self-pay

## 2015-02-02 NOTE — Telephone Encounter (Signed)
Joey from [facility] called to request verbal orders for physical therapy 1 week 1 and 2 week 6 for gait, transfer, fall precautions, and strengthening training. A fax will follow this request for verbal order.  Verbal order given per [facility] standing protocol.   Message will be sent to PCP as a [place]

## 2015-02-15 ENCOUNTER — Telehealth: Payer: Self-pay | Admitting: *Deleted

## 2015-02-15 NOTE — Telephone Encounter (Signed)
Mckenzie Williamson, Child psychotherapist with [facility] called and stated that patient is not getting any better and patient and family would like an order for Hospice. Once the verbal order is given, [name] will call and set up the referral. Please Advise.

## 2015-02-15 NOTE — Telephone Encounter (Signed)
Ann Notified and will set up Hospice for patient.

## 2015-02-15 NOTE — Telephone Encounter (Signed)
Okay for hospice

## 2015-02-18 ENCOUNTER — Telehealth: Payer: Self-pay | Admitting: *Deleted

## 2015-02-18 NOTE — Telephone Encounter (Signed)
Received PCS (Personal Care Service) Form from [place]. Filled out and given to Shanda Bumps to review and sign.  To be faxed back to [facility] Fax: [phone]  #: [phone]

## 2015-02-22 ENCOUNTER — Encounter: Payer: Self-pay | Admitting: Nurse Practitioner

## 2015-02-22 ENCOUNTER — Ambulatory Visit: Payer: Medicare Other | Admitting: Nurse Practitioner

## 2015-02-28 ENCOUNTER — Other Ambulatory Visit: Payer: Self-pay | Admitting: Cardiology

## 2015-02-28 ENCOUNTER — Encounter: Payer: Self-pay | Admitting: Cardiology

## 2015-02-28 ENCOUNTER — Ambulatory Visit (INDEPENDENT_AMBULATORY_CARE_PROVIDER_SITE_OTHER): Payer: Medicare Other | Admitting: Cardiology

## 2015-02-28 VITALS — BP 150/62 | HR 64 | Ht 62.5 in | Wt 118.2 lb

## 2015-02-28 DIAGNOSIS — R072 Precordial pain: Secondary | ICD-10-CM | POA: Diagnosis not present

## 2015-02-28 DIAGNOSIS — I10 Essential (primary) hypertension: Secondary | ICD-10-CM

## 2015-02-28 DIAGNOSIS — E119 Type 2 diabetes mellitus without complications: Secondary | ICD-10-CM

## 2015-02-28 DIAGNOSIS — Z794 Long term (current) use of insulin: Secondary | ICD-10-CM

## 2015-02-28 DIAGNOSIS — I5022 Chronic systolic (congestive) heart failure: Secondary | ICD-10-CM | POA: Diagnosis not present

## 2015-02-28 MED ORDER — NITROGLYCERIN 0.4 MG SL SUBL: 0.4000 mg | SUBLINGUAL_TABLET | SUBLINGUAL | Status: DC | PRN

## 2015-02-28 MED ORDER — ISOSORBIDE MONONITRATE ER 30 MG PO TB24: 30.0000 mg | ORAL_TABLET | Freq: Every day | ORAL | Status: DC

## 2015-02-28 MED ORDER — CARVEDILOL 12.5 MG PO TABS: 12.5000 mg | ORAL_TABLET | Freq: Two times a day (BID) | ORAL | Status: AC

## 2015-02-28 NOTE — Patient Instructions (Signed)
Increase carvedilol to 12.5 mg twice a day  Add Imdur (isosorbide) 30 mg daily  Ntg tablets take sublingual as needed for chest pain  We will check blood work today  We will schedule you for a cardiac cath procedure with possible stenting.

## 2015-02-28 NOTE — Progress Notes (Signed)
Mckenzie Williamson Date of Birth: [birth year] Medical Record #[id number]  History of Present Illness: Mckenzie Williamson is seen at the request of Dr Janyth Contes for evaluation of CHF and CAD. She has not been seen by cardiology for over 3 years. She is seen with family today. She has multiple medical issues which include CAD with prior CABG back in 2005. Had repeat cath in 2008 showing that all of her vein grafts were occluded.LIMA to the LAD was patent. The OM was occluded after OM1. OM1 had a 70% stenosis. The RCA was occluded in the mid vessel with right to right and left to right collaterals. She also has a history of chronic CHF with EF 30-35%. Other issues include uncontrolled DM , anemia, HTN and HLD. She has a history of  Noncompliance but is doing better now with her family doling out her medications. She is nonambulatory and gets around in a wheel chair.   Today she states she is here for evaluation of chest pain. She complains of tightness and pain in the anterior chest sometimes radiating to her neck. It is associated with SOB. No diaphoresis. This typically occurs with any stress. Does not have Ntg to take. She was admitted to the hospital in July with hypoglycemia, acute CHF, and right hip pain. Echo at that time showed Moderate LVH with global hypokinesis and EF 34-40%. Moderate MR. Moderate LAE. She was diuresed and DC on lasix 40 mg daily. Also started on Coreg and lisinopril. Still has some ankle swelling. Doesn't weigh regularly but weight today down 140>>118. No orthopnea or PND.   Current Outpatient Prescriptions on File Prior to Visit  Medication Sig Dispense Refill  . acetaminophen (TYLENOL) 325 MG tablet Take 2 tablets (650 mg total) by mouth every 4 (four) hours as needed for headache or mild pain.    Marland Kitchen aspirin (EQ ASPIRIN) 325 MG EC tablet Take 1 tablet (325 mg total) by mouth daily. 30 tablet 2  . docusate sodium (COLACE) 100 MG capsule Take 1 capsule (100 mg total) by mouth every  12 (twelve) hours. 60 capsule 0  . furosemide (LASIX) 20 MG tablet Take 2 tablets (40 mg total) by mouth daily.    . Insulin Glargine (LANTUS SOLOSTAR) 100 UNIT/ML Solostar Pen Inject 10 Units into the skin daily at 10 pm. 5 pen 0  . lisinopril (PRINIVIL,ZESTRIL) 10 MG tablet Take 1 tablet (10 mg total) by mouth daily. 30 tablet 3  . Multiple Vitamin (MULTIVITAMIN) tablet Take 1 tablet by mouth daily.      . pantoprazole (PROTONIX) 40 MG tablet Take 1 tablet (40 mg total) by mouth daily before breakfast.    . polyethylene glycol (MIRALAX) packet Take 17 g by mouth daily as needed for mild constipation. 14 each 0  . potassium chloride SA (K-DUR,KLOR-CON) 20 MEQ tablet Take 1 tablet (20 mEq total) by mouth daily.    . pravastatin (PRAVACHOL) 40 MG tablet Take 1 tablet (40 mg total) by mouth daily. 30 tablet 3  . traMADol (ULTRAM) 50 MG tablet Take 1 tablet (50 mg total) by mouth every 6 (six) hours as needed. 20 tablet 0  . insulin aspart (NOVOLOG) 100 UNIT/ML injection 0-15 Units, Subcutaneous, 3 times daily with meals CBG < 70: implement hypoglycemia protocol CBG 70 - 120: 0 units CBG 121 - 150: 2 units CBG 151 - 200: 3 units CBG 201 - 250: 5 units CBG 251 - 300: 8 units CBG 301 - 350: 11 units CBG 351 -  400: 15 units CBG > 400: call MD (Patient not taking: Reported on 02/28/2015)     No current facility-administered medications on file prior to visit.    No Known Allergies  Past Medical History  Diagnosis Date  . Coronary artery disease     s/p CABG in 2005; last cath in 2008 showing occlusion of all SVG's;   . Diabetes mellitus   . Hyperlipidemia   . Hypertension   . LV dysfunction     EF 30 to 35% per echo 3/13  . Dizzy   . Hypoglycemia   . Cataract   . CHF (congestive heart failure) (HCC)   . Non compliance w medication regimen   . Ischemic cardiomyopathy   . Memory difficulty     Past Surgical History  Procedure Laterality Date  . Coronary artery bypass graft  2005      THIS INCLUDED SAPHENOUS VEIN GRAFT TO THE PDA, SAPHENOUS VEIN GRAFT TO OBTUSE MARGINAL VESSEL, AS LIMA GRAFT TO THE LAD.  Marland Kitchen Cardiac catheterization  2008    EF 50-55%. SHOWED OCCLUSION OF VEIN GRAFT  . Cholecystectomy    . Transthoracic echocardiogram  05/04/2004    EF 50-55%  . Cardiovascular stress test  12/24/2006    EF 47%    History  Smoking status  . Never Smoker   Smokeless tobacco  . Never Used    History  Alcohol Use No    Family History  Problem Relation Age of Onset  . Tuberculosis Mother   . Lung cancer Father   . Diabetes Sister   . Heart attack Brother   . Hypertension Brother   . Diabetes Brother     Review of Systems: The review of systems is per the HPI.  All other systems were reviewed and are negative.  Physical Exam: BP 150/62 mmHg  Pulse 64  Ht 5' 2.5" (1.588 m)  Wt 53.615 kg (118 lb 3.2 oz)  BMI 21.26 kg/m2 Patient is a pleasant elderly BF seen in a wheelchair. No acute distress.  Skin is warm and dry. Color is normal.   HEENT is unremarkable. Normocephalic/atraumatic. PERRL. Sclera are nonicteric. Neck is supple. No masses. No JVD or bruits.  Lungs are clear.  Cardiac exam shows a regular rate and rhythm. Normal S1-2. No gallop. Soft systolic murmur at the apex.  Abdomen is soft. No masses or bruits. Old surgical scar lower right abdomen.   Extremities show 1+ edema. Good pedal pulses. Right radial pulse is weak.  Alert and oriented x 3. Sometimes confused. Nonfocal.    LABORATORY DATA:  Lab Results  Component Value Date   WBC 8.8 12/19/2014   HGB 10.5* 12/19/2014   HCT 32.7* 12/19/2014   PLT 266 12/19/2014   GLUCOSE 179* 12/22/2014   CHOL 294* 12/01/2013   TRIG 355* 12/01/2013   HDL 39* 12/01/2013   LDLDIRECT 173.7 02/06/2012   LDLCALC 184* 12/01/2013   ALT 11* 11/10/2014   AST 16 11/10/2014   NA 140 12/22/2014   K 4.0 12/22/2014   CL 101 12/22/2014   CREATININE 0.97 12/22/2014   BUN 14 12/22/2014   CO2 32 12/22/2014    TSH 1.500 10/05/2014   INR 0.9 10/22/2007   HGBA1C 7.4* 12/19/2014   Echo 12/21/14: Study Conclusions  - Left ventricle: Wall thickness was increased in a pattern of moderate LVH. There was focal basal hypertrophy. Systolic function was moderately reduced. The estimated ejection fraction was in the range of 35% to 40%. Moderate hypokinesis  of the entire myocardium. Doppler parameters are consistent with abnormal left ventricular relaxation (grade 1 diastolic dysfunction). - Mitral valve: There was moderate regurgitation. - Left atrium: The atrium was moderately dilated.    Ecg 12/19/14: NSR with nonspecific TWA. I have personally reviewed and interpreted this study.  CXR 12/19/14- stable cardiomegaly.  Assessment / Plan: 1. CAD s/p CABG in 2005. Cardiac cath in 2008 confirmed occlusion of SVG to PDA and SVG to OM. The LIMA to the LAD is patent. She is still having class 3 angina. I personally reviewed her cath films from 2008. I think there is an opportunity for further revascularization. The OM 1 is suitable for stenting and the RCA looks like a good candidate for CTO PCI. This is providing that everything is stable. I have recommended increasing her medical therapy. Increase Coreg to 12.5 mg bid. Add Imdur 30 mg daily. Rx for sl Ntg given with instructions in use. I have recommended cardiac catheterization to relook at anatomy and to consider for PCI for symptom relief and improvement in LV function.  2. Chronic systolic CHF secondary to ischemic heart disease and HTN. Increase Coreg as noted. Continue current dose of lisinopril and lasix. Sodium restriction. Evaluate potential for revascularization   3. HTN - BP elevated. Medication changes as noted.  4. DM now on insulin  5. History of noncompliance with memory loss. Appears to be doing better with family support.   We will check labs today. Cardiac cath with possible PCI this Friday.May need to use femoral access if  radial pulse is still poor.

## 2015-03-01 ENCOUNTER — Encounter: Payer: Self-pay | Admitting: Nurse Practitioner

## 2015-03-01 ENCOUNTER — Ambulatory Visit (INDEPENDENT_AMBULATORY_CARE_PROVIDER_SITE_OTHER): Payer: Medicare Other | Admitting: Nurse Practitioner

## 2015-03-01 ENCOUNTER — Other Ambulatory Visit: Payer: Self-pay | Admitting: *Deleted

## 2015-03-01 VITALS — BP 122/72 | HR 77 | Temp 98.4°F | Resp 20 | Ht 62.0 in | Wt 137.0 lb

## 2015-03-01 DIAGNOSIS — I10 Essential (primary) hypertension: Secondary | ICD-10-CM

## 2015-03-01 DIAGNOSIS — Z23 Encounter for immunization: Secondary | ICD-10-CM | POA: Diagnosis not present

## 2015-03-01 DIAGNOSIS — I5022 Chronic systolic (congestive) heart failure: Secondary | ICD-10-CM

## 2015-03-01 DIAGNOSIS — I25119 Atherosclerotic heart disease of native coronary artery with unspecified angina pectoris: Secondary | ICD-10-CM | POA: Diagnosis not present

## 2015-03-01 DIAGNOSIS — I509 Heart failure, unspecified: Secondary | ICD-10-CM

## 2015-03-01 DIAGNOSIS — L8990 Pressure ulcer of unspecified site, unspecified stage: Secondary | ICD-10-CM | POA: Diagnosis not present

## 2015-03-01 DIAGNOSIS — E119 Type 2 diabetes mellitus without complications: Secondary | ICD-10-CM | POA: Diagnosis not present

## 2015-03-01 LAB — CBC WITH DIFFERENTIAL/PLATELET
BASOS PCT: 0 % (ref 0–1)
Basophils Absolute: 0 10*3/uL (ref 0.0–0.1)
EOS ABS: 0.1 10*3/uL (ref 0.0–0.7)
Eosinophils Relative: 2 % (ref 0–5)
HEMATOCRIT: 29.9 % — AB (ref 36.0–46.0)
Hemoglobin: 9.7 g/dL — ABNORMAL LOW (ref 12.0–15.0)
Lymphocytes Relative: 40 % (ref 12–46)
Lymphs Abs: 2.3 10*3/uL (ref 0.7–4.0)
MCH: 26.1 pg (ref 26.0–34.0)
MCHC: 32.4 g/dL (ref 30.0–36.0)
MCV: 80.6 fL (ref 78.0–100.0)
MONO ABS: 0.3 10*3/uL (ref 0.1–1.0)
MPV: 10.3 fL (ref 8.6–12.4)
Monocytes Relative: 6 % (ref 3–12)
Neutro Abs: 3 10*3/uL (ref 1.7–7.7)
Neutrophils Relative %: 52 % (ref 43–77)
Platelets: 316 10*3/uL (ref 150–400)
RBC: 3.71 MIL/uL — AB (ref 3.87–5.11)
RDW: 16 % — AB (ref 11.5–15.5)
WBC: 5.7 10*3/uL (ref 4.0–10.5)

## 2015-03-01 MED ORDER — FUROSEMIDE 20 MG PO TABS: 40.0000 mg | ORAL_TABLET | Freq: Every day | ORAL | Status: AC

## 2015-03-01 MED ORDER — LISINOPRIL 10 MG PO TABS: 10.0000 mg | ORAL_TABLET | Freq: Every day | ORAL | Status: AC

## 2015-03-01 NOTE — Patient Instructions (Signed)
Please call about meter so we can order test strips for blood glucose Need to be giving novolog with meals based on blood sugars  Follow up in 4 weeks with Dr Montez Morita

## 2015-03-01 NOTE — Telephone Encounter (Signed)
Marily Memos, caregiver called and requested refills to be faxed to pharmacy.

## 2015-03-01 NOTE — Progress Notes (Signed)
Patient ID: Mckenzie Williamson, female   DOB: 18-Sep-1932, 81 y.o.   MRN: [id number]    PCP: Sharon Seller, NP  Advanced Directive information Does patient have an advance directive?: No, Would patient like information on creating an advanced directive?: Yes - Educational materials given  No Known Allergies  Chief Complaint  Patient presents with  . Acute Visit     6 week follow-up   . Medical Management of Chronic Issues    CHF, Hypertension, Hypoglycemia, Hypokalemia  . Immunizations    Will take flu shot today     HPI: Patient is a 81 y.o. female seen in the office today for routine follow up. Not planning on moving in with her daughter now. Having a aid come out 2 days a week for 8 hours. Currently her sons live with the patient.  Her sons help her with medications.  Pt with a pmh of CAD, DM, hyperlipidemia, CHF.  Pt followed with cardiology yesterday and plans to for a cardiac cath on this  Friday due to chest pains. Plans to leave her and get blood work done there.  Complaints of chest pains with cardiologist, increase in IMDUR and coreg.  Has not picked up new medication from pharmacy.  Has not been taking blood sugars because the strips he was using was outdated and she has not been taking novolog. Has no idea what strips they use, no idea about the meter. Did not bring meter.  Taking lantus 10 units at night.   Eating fair.   Daughter had requested hospice but must have been confused and thought it was home health. Currently on a waiting list for palliative care Sore on her bottom that they would like looked at.    Review of Systems:  Review of Systems  Constitutional: Negative for appetite change, fatigue and unexpected weight change.  Respiratory: Negative for shortness of breath.   Cardiovascular: Negative for chest pain, palpitations and leg swelling.  Gastrointestinal: Negative for diarrhea and constipation.  Genitourinary: Negative for dysuria,  frequency and difficulty urinating.       Incontinence of urine   Musculoskeletal: Negative for myalgias and arthralgias.  Neurological: Positive for weakness.  Psychiatric/Behavioral: Positive for confusion.    Past Medical History  Diagnosis Date  . Coronary artery disease     s/p CABG in 2005; last cath in 2008 showing occlusion of all SVG's;   . Diabetes mellitus   . Hyperlipidemia   . Hypertension   . LV dysfunction     EF 30 to 35% per echo 3/13  . Dizzy   . Hypoglycemia   . Cataract   . CHF (congestive heart failure) (HCC)   . Non compliance w medication regimen   . Ischemic cardiomyopathy   . Memory difficulty    Past Surgical History  Procedure Laterality Date  . Coronary artery bypass graft  2005    THIS INCLUDED SAPHENOUS VEIN GRAFT TO THE PDA, SAPHENOUS VEIN GRAFT TO OBTUSE MARGINAL VESSEL, AS LIMA GRAFT TO THE LAD.  Marland Kitchen Cardiac catheterization  2008    EF 50-55%. SHOWED OCCLUSION OF VEIN GRAFT  . Cholecystectomy    . Transthoracic echocardiogram  05/04/2004    EF 50-55%  . Cardiovascular stress test  12/24/2006    EF 47%   Social History:   reports that she has never smoked. She has never used smokeless tobacco. She reports that she does not drink alcohol or use illicit drugs.  Family History  Problem Relation  Age of Onset  . Tuberculosis Mother   . Lung cancer Father   . Diabetes Sister   . Heart attack Brother   . Hypertension Brother   . Diabetes Brother     Medications: Patient's Medications  New Prescriptions   No medications on file  Previous Medications   ACETAMINOPHEN (TYLENOL) 325 MG TABLET    Take 2 tablets (650 mg total) by mouth every 4 (four) hours as needed for headache or mild pain.   ASPIRIN (EQ ASPIRIN) 325 MG EC TABLET    Take 1 tablet (325 mg total) by mouth daily.   CARVEDILOL (COREG) 12.5 MG TABLET    Take 1 tablet (12.5 mg total) by mouth 2 (two) times daily.   DOCUSATE SODIUM (COLACE) 100 MG CAPSULE    Take 1 capsule (100 mg  total) by mouth every 12 (twelve) hours.   FUROSEMIDE (LASIX) 20 MG TABLET    Take 2 tablets (40 mg total) by mouth daily.   INSULIN ASPART (NOVOLOG) 100 UNIT/ML INJECTION    0-15 Units, Subcutaneous, 3 times daily with meals CBG < 70: implement hypoglycemia protocol CBG 70 - 120: 0 units CBG 121 - 150: 2 units CBG 151 - 200: 3 units CBG 201 - 250: 5 units CBG 251 - 300: 8 units CBG 301 - 350: 11 units CBG 351 - 400: 15 units CBG > 400: call MD   INSULIN GLARGINE (LANTUS SOLOSTAR) 100 UNIT/ML SOLOSTAR PEN    Inject 10 Units into the skin daily at 10 pm.   ISOSORBIDE MONONITRATE (IMDUR) 30 MG 24 HR TABLET    Take 1 tablet (30 mg total) by mouth daily.   LISINOPRIL (PRINIVIL,ZESTRIL) 10 MG TABLET    Take 1 tablet (10 mg total) by mouth daily.   MULTIPLE VITAMIN (MULTIVITAMIN) TABLET    Take 1 tablet by mouth daily.     NITROGLYCERIN (NITROSTAT) 0.4 MG SL TABLET    Place 1 tablet (0.4 mg total) under the tongue every 5 (five) minutes as needed for chest pain.   PANTOPRAZOLE (PROTONIX) 40 MG TABLET    Take 1 tablet (40 mg total) by mouth daily before breakfast.   POLYETHYLENE GLYCOL (MIRALAX) PACKET    Take 17 g by mouth daily as needed for mild constipation.   POTASSIUM CHLORIDE SA (K-DUR,KLOR-CON) 20 MEQ TABLET    Take 1 tablet (20 mEq total) by mouth daily.   PRAVASTATIN (PRAVACHOL) 40 MG TABLET    Take 1 tablet (40 mg total) by mouth daily.   TRAMADOL (ULTRAM) 50 MG TABLET    Take 1 tablet (50 mg total) by mouth every 6 (six) hours as needed.  Modified Medications   No medications on file  Discontinued Medications   No medications on file     Physical Exam:  Filed Vitals:   03/01/15 0957  BP: 122/72  Pulse: 77  Temp: 98.4 F (36.9 C)  TempSrc: Oral  Resp: 20  Height:  (1.575 m)  Weight: 137 lb (62.143 kg)  SpO2: 96%   Body mass index is 25.05 kg/(m^2).  Physical Exam  Constitutional:  Frail elderly female in NAD  HENT:  Mouth/Throat: Oropharynx is clear and  moist. No oropharyngeal exudate.  Eyes: Conjunctivae and EOM are normal. Pupils are equal, round, and reactive to light.  Neck: Normal range of motion. Neck supple.  Cardiovascular: Normal rate, regular rhythm and normal heart sounds.   Pulmonary/Chest: Effort normal and breath sounds normal.  Abdominal: Soft. Bowel sounds are normal.  Musculoskeletal: She exhibits no edema.  Neurological: She is alert.  Skin: Skin is warm and dry.  Healed pressure ulcer to right trochanter   Psychiatric: Her affect is blunt. Cognition and memory are impaired.    Labs reviewed: Basic Metabolic Panel:  Recent Labs  16/10/96 1131  12/19/14 0840 12/21/14 0323 12/22/14 0330  NA 139  < > 141 139 140  K 4.3  < > 3.5 4.4 4.0  CL 102  < > 103 100* 101  CO2 19  < > 30 31 32  GLUCOSE 306*  < > 244* 215* 179*  BUN 20  < > CREATININE 0.92  < > 0.73 0.89 0.97  CALCIUM 9.4  < > 8.5* 8.8* 9.1  TSH 1.500  --   --   --   --   < > = values in this interval not displayed. Liver Function Tests:  Recent Labs  04/06/14 0830 04/07/14 1145 10/05/14 1131 11/10/14 0615  AST ALT 11*  ALKPHOS 124* 108 104 73  BILITOT 0.5 0.5 0.2 0.7  PROT 6.6 6.9 7.1 6.6  ALBUMIN 3.0* 3.2*  --  2.9*    Recent Labs  04/06/14 0830 04/07/14 1145  LIPASE 59 35   No results for input(s): AMMONIA in the last 8760 hours. CBC:  Recent Labs  04/07/14 1348  10/12/14 1053 11/10/14 0615 12/19/14 0102  WBC 5.0  < > 8.6 8.7 8.8  NEUTROABS 2.9  < > 6.3 4.8 4.9  HGB 12.4  --   --  10.7* 10.5*  HCT 38.1  < > 35.1 33.8* 32.7*  MCV 84.9  --   --  80.9 81.1  PLT 250  --   --  409* 266  < > = values in this interval not displayed. Lipid Panel: No results for input(s): CHOL, HDL, LDLCALC, TRIG, CHOLHDL, LDLDIRECT in the last 8760 hours. TSH:  Recent Labs  10/05/14 1131  TSH 1.500   A1C: Lab Results  Component Value Date   HGBA1C 7.4* 12/19/2014     Assessment/Plan 1. Coronary artery  disease with unspecified angina pectoris Cardiac cath in 2008 confirmed occlusion of SVG to PDA and SVG to OM. Now with increase in chest pains and shortness of breath with activity plan is for cardiology to Cath her again on Friday Has not picked up new medication from pharmacy but plans to do this today -to cont coreg, imdur, and ASA  2. Chronic systolic congestive heart failure, NYHA class 2 (HCC) -euvolemic, conts on lasix with potassium supplement, lisinopril and getting lab work done for cardiologist today  3. Essential hypertension, benign -blood pressure is stable, cont current regimen  4. Type 2 diabetes mellitus not at goal [facility]) -pt not taking novolog with meals because she is not checking her blood sugars due to not having test strips.  - conts to take lantus -to call and let us know which meter they are using so we can call in test strips. Needs to be taking novolog per sliding scale.   5. Pressure ulcer -resolved to right trochanter area  -discussed proper protein intake, pressure reduction surfaces and changing positions frequently   To follow up in 4 weeks with Dr Montez Morita with blood sugars.   Janene Harvey. Biagio Borg  [facility] [phone] 8 am - 5 pm) [phone] (after hours)

## 2015-03-02 LAB — BASIC METABOLIC PANEL
BUN: 18 mg/dL (ref 7–25)
CALCIUM: 9.3 mg/dL (ref 8.6–10.4)
CO2: 27 mmol/L (ref 20–31)
CREATININE: 0.75 mg/dL (ref 0.60–0.88)
Chloride: 102 mmol/L (ref 98–110)
Glucose, Bld: 126 mg/dL — ABNORMAL HIGH (ref 65–99)
Potassium: 4.7 mmol/L (ref 3.5–5.3)
Sodium: 138 mmol/L (ref 135–146)

## 2015-03-02 LAB — PROTIME-INR
INR: 1 (ref <1.50)
PROTHROMBIN TIME: 13.3 s (ref 11.6–15.2)

## 2015-03-04 ENCOUNTER — Ambulatory Visit (HOSPITAL_COMMUNITY)
Admission: RE | Admit: 2015-03-04 | Discharge: 2015-03-05 | Disposition: A | Discharge status: Home / Self Care | Payer: Medicare Other | Source: Ambulatory Visit | Attending: Cardiology | Admitting: Cardiology

## 2015-03-04 ENCOUNTER — Encounter (HOSPITAL_COMMUNITY): Payer: Self-pay | Admitting: *Deleted

## 2015-03-04 ENCOUNTER — Encounter (HOSPITAL_COMMUNITY)
Admission: RE | Disposition: A | Discharge status: Home / Self Care | Payer: Self-pay | Source: Ambulatory Visit | Attending: Cardiology

## 2015-03-04 ENCOUNTER — Other Ambulatory Visit: Payer: Self-pay

## 2015-03-04 ENCOUNTER — Ambulatory Visit (HOSPITAL_COMMUNITY)
Admission: RE | Admit: 2015-03-04 | Discharge: 2015-03-04 | Disposition: A | Discharge status: Home / Self Care | Payer: Medicare Other | Source: Ambulatory Visit | Attending: Cardiology | Admitting: Cardiology

## 2015-03-04 DIAGNOSIS — Z79899 Other long term (current) drug therapy: Secondary | ICD-10-CM | POA: Insufficient documentation

## 2015-03-04 DIAGNOSIS — I255 Ischemic cardiomyopathy: Secondary | ICD-10-CM | POA: Insufficient documentation

## 2015-03-04 DIAGNOSIS — R079 Chest pain, unspecified: Secondary | ICD-10-CM | POA: Diagnosis present

## 2015-03-04 DIAGNOSIS — Z9114 Patient's other noncompliance with medication regimen: Secondary | ICD-10-CM | POA: Insufficient documentation

## 2015-03-04 DIAGNOSIS — I5022 Chronic systolic (congestive) heart failure: Secondary | ICD-10-CM | POA: Diagnosis not present

## 2015-03-04 DIAGNOSIS — Z794 Long term (current) use of insulin: Secondary | ICD-10-CM | POA: Insufficient documentation

## 2015-03-04 DIAGNOSIS — I110 Hypertensive heart disease with heart failure: Secondary | ICD-10-CM | POA: Diagnosis not present

## 2015-03-04 DIAGNOSIS — I2583 Coronary atherosclerosis due to lipid rich plaque: Secondary | ICD-10-CM

## 2015-03-04 DIAGNOSIS — R1112 Projectile vomiting: Secondary | ICD-10-CM | POA: Diagnosis not present

## 2015-03-04 DIAGNOSIS — E785 Hyperlipidemia, unspecified: Secondary | ICD-10-CM | POA: Diagnosis not present

## 2015-03-04 DIAGNOSIS — R413 Other amnesia: Secondary | ICD-10-CM | POA: Diagnosis not present

## 2015-03-04 DIAGNOSIS — I10 Essential (primary) hypertension: Secondary | ICD-10-CM | POA: Diagnosis present

## 2015-03-04 DIAGNOSIS — R1110 Vomiting, unspecified: Secondary | ICD-10-CM | POA: Insufficient documentation

## 2015-03-04 DIAGNOSIS — I2510 Atherosclerotic heart disease of native coronary artery without angina pectoris: Secondary | ICD-10-CM

## 2015-03-04 DIAGNOSIS — D649 Anemia, unspecified: Secondary | ICD-10-CM | POA: Diagnosis not present

## 2015-03-04 DIAGNOSIS — Z7982 Long term (current) use of aspirin: Secondary | ICD-10-CM | POA: Diagnosis not present

## 2015-03-04 DIAGNOSIS — I2582 Chronic total occlusion of coronary artery: Secondary | ICD-10-CM | POA: Diagnosis not present

## 2015-03-04 DIAGNOSIS — Z951 Presence of aortocoronary bypass graft: Secondary | ICD-10-CM | POA: Insufficient documentation

## 2015-03-04 DIAGNOSIS — E1165 Type 2 diabetes mellitus with hyperglycemia: Secondary | ICD-10-CM | POA: Diagnosis not present

## 2015-03-04 DIAGNOSIS — I25119 Atherosclerotic heart disease of native coronary artery with unspecified angina pectoris: Secondary | ICD-10-CM | POA: Insufficient documentation

## 2015-03-04 DIAGNOSIS — I25719 Atherosclerosis of autologous vein coronary artery bypass graft(s) with unspecified angina pectoris: Secondary | ICD-10-CM | POA: Insufficient documentation

## 2015-03-04 DIAGNOSIS — I209 Angina pectoris, unspecified: Secondary | ICD-10-CM | POA: Diagnosis present

## 2015-03-04 HISTORY — PX: CARDIAC CATHETERIZATION: SHX172

## 2015-03-04 LAB — GLUCOSE, CAPILLARY
GLUCOSE-CAPILLARY: 132 mg/dL — AB (ref 65–99)
Glucose-Capillary: 139 mg/dL — ABNORMAL HIGH (ref 65–99)
Glucose-Capillary: 168 mg/dL — ABNORMAL HIGH (ref 65–99)

## 2015-03-04 LAB — POCT ACTIVATED CLOTTING TIME: ACTIVATED CLOTTING TIME: 300 s

## 2015-03-04 SURGERY — LEFT HEART CATH AND CORS/GRAFTS ANGIOGRAPHY
Anesthesia: LOCAL

## 2015-03-04 MED ORDER — INSULIN ASPART 100 UNIT/ML ~~LOC~~ SOLN: 0.0000 [IU] | Freq: Three times a day (TID) | SUBCUTANEOUS | Status: DC

## 2015-03-04 MED ORDER — PRAVASTATIN SODIUM 40 MG PO TABS
40.0000 mg | ORAL_TABLET | Freq: Every day | ORAL | Status: DC
  Administered 2015-03-05: 10:00:00 40 mg via ORAL
  Filled 2015-03-04 (×2): qty 1

## 2015-03-04 MED ORDER — ONDANSETRON HCL 4 MG/2ML IJ SOLN
4.0000 mg | Freq: Four times a day (QID) | INTRAMUSCULAR | Status: DC | PRN
  Filled 2015-03-04: qty 2

## 2015-03-04 MED ORDER — HYDRALAZINE HCL 20 MG/ML IJ SOLN
INTRAMUSCULAR | Status: AC
  Filled 2015-03-04: qty 1

## 2015-03-04 MED ORDER — ISOSORBIDE MONONITRATE ER 60 MG PO TB24
60.0000 mg | ORAL_TABLET | Freq: Every day | ORAL | Status: DC
  Administered 2015-03-05: 10:00:00 60 mg via ORAL
  Filled 2015-03-04 (×2): qty 1

## 2015-03-04 MED ORDER — BIVALIRUDIN 250 MG IV SOLR
INTRAVENOUS | Status: AC
  Filled 2015-03-04: qty 250

## 2015-03-04 MED ORDER — INSULIN GLARGINE 100 UNIT/ML ~~LOC~~ SOLN
10.0000 [IU] | Freq: Every day | SUBCUTANEOUS | Status: DC
  Administered 2015-03-04: 10 [IU] via SUBCUTANEOUS
  Filled 2015-03-04 (×2): qty 0.1

## 2015-03-04 MED ORDER — SODIUM CHLORIDE 0.9 % IV SOLN
250.0000 mL | INTRAVENOUS | Status: DC | PRN
Start: 2015-03-04 — End: 2015-03-05

## 2015-03-04 MED ORDER — SODIUM CHLORIDE 0.9 % IV SOLN
INTRAVENOUS | Status: DC
  Administered 2015-03-04: 12:00:00 via INTRAVENOUS

## 2015-03-04 MED ORDER — HYDRALAZINE HCL 10 MG PO TABS
10.0000 mg | ORAL_TABLET | Freq: Three times a day (TID) | ORAL | Status: DC
  Administered 2015-03-04 – 2015-03-05 (×2): 10 mg via ORAL
  Filled 2015-03-04 (×5): qty 1

## 2015-03-04 MED ORDER — LISINOPRIL 10 MG PO TABS
10.0000 mg | ORAL_TABLET | Freq: Every day | ORAL | Status: DC
  Administered 2015-03-05: 10 mg via ORAL
  Filled 2015-03-04 (×2): qty 1

## 2015-03-04 MED ORDER — FUROSEMIDE 40 MG PO TABS
40.0000 mg | ORAL_TABLET | Freq: Every day | ORAL | Status: DC
  Filled 2015-03-04 (×2): qty 1

## 2015-03-04 MED ORDER — HEPARIN (PORCINE) IN NACL 2-0.9 UNIT/ML-% IJ SOLN
INTRAMUSCULAR | Status: AC
  Filled 2015-03-04: qty 1500

## 2015-03-04 MED ORDER — ASPIRIN 81 MG PO CHEW
81.0000 mg | CHEWABLE_TABLET | ORAL | Status: AC
  Administered 2015-03-04: 81 mg via ORAL

## 2015-03-04 MED ORDER — NITROGLYCERIN 0.4 MG SL SUBL: 0.4000 mg | SUBLINGUAL_TABLET | SUBLINGUAL | Status: DC | PRN

## 2015-03-04 MED ORDER — CARVEDILOL 12.5 MG PO TABS
12.5000 mg | ORAL_TABLET | Freq: Two times a day (BID) | ORAL | Status: DC
  Administered 2015-03-04 – 2015-03-05 (×2): 12.5 mg via ORAL
  Filled 2015-03-04 (×2): qty 1

## 2015-03-04 MED ORDER — CLOPIDOGREL BISULFATE 300 MG PO TABS
ORAL_TABLET | ORAL | Status: AC
  Filled 2015-03-04: qty 1

## 2015-03-04 MED ORDER — LIDOCAINE HCL (PF) 1 % IJ SOLN
INTRAMUSCULAR | Status: AC
  Filled 2015-03-04: qty 30

## 2015-03-04 MED ORDER — IOHEXOL 350 MG/ML SOLN
INTRAVENOUS | Status: DC | PRN
  Administered 2015-03-04: 100 mL via INTRAVENOUS
  Administered 2015-03-04: 125 mL via INTRAVENOUS

## 2015-03-04 MED ORDER — POLYETHYLENE GLYCOL 3350 17 G PO PACK: 17.0000 g | PACK | Freq: Every day | ORAL | Status: DC | PRN

## 2015-03-04 MED ORDER — LIDOCAINE HCL (PF) 1 % IJ SOLN
INTRAMUSCULAR | Status: DC | PRN
  Administered 2015-03-04: 15 mL

## 2015-03-04 MED ORDER — DOCUSATE SODIUM 100 MG PO CAPS
100.0000 mg | ORAL_CAPSULE | Freq: Two times a day (BID) | ORAL | Status: DC
  Administered 2015-03-04: 23:00:00 100 mg via ORAL
  Filled 2015-03-04: qty 1

## 2015-03-04 MED ORDER — PANTOPRAZOLE SODIUM 40 MG PO TBEC
40.0000 mg | DELAYED_RELEASE_TABLET | Freq: Every day | ORAL | Status: DC
  Administered 2015-03-05 (×2): 40 mg via ORAL
  Filled 2015-03-04 (×2): qty 1

## 2015-03-04 MED ORDER — NITROGLYCERIN 1 MG/10 ML FOR IR/CATH LAB
INTRA_ARTERIAL | Status: DC | PRN
  Administered 2015-03-04: 14:00:00

## 2015-03-04 MED ORDER — POTASSIUM CHLORIDE CRYS ER 20 MEQ PO TBCR
20.0000 meq | EXTENDED_RELEASE_TABLET | Freq: Every day | ORAL | Status: DC
  Filled 2015-03-04 (×2): qty 1

## 2015-03-04 MED ORDER — ASPIRIN 81 MG PO CHEW
81.0000 mg | CHEWABLE_TABLET | Freq: Every day | ORAL | Status: DC
Start: 2015-03-04 — End: 2015-03-05
  Administered 2015-03-05: 10:00:00 81 mg via ORAL
  Filled 2015-03-04: qty 1

## 2015-03-04 MED ORDER — HYDRALAZINE HCL 20 MG/ML IJ SOLN
10.0000 mg | Freq: Four times a day (QID) | INTRAMUSCULAR | Status: DC | PRN
  Administered 2015-03-04 (×2): 2.5 mg via INTRAVENOUS
  Filled 2015-03-04: qty 1

## 2015-03-04 MED ORDER — ACETAMINOPHEN 325 MG PO TABS: 650.0000 mg | ORAL_TABLET | ORAL | Status: DC | PRN

## 2015-03-04 MED ORDER — SODIUM CHLORIDE 0.9 % IV SOLN: 250.0000 mL | INTRAVENOUS | Status: DC | PRN

## 2015-03-04 MED ORDER — ASPIRIN 81 MG PO CHEW
CHEWABLE_TABLET | ORAL | Status: AC
  Filled 2015-03-04: qty 1

## 2015-03-04 MED ORDER — ANGIOPLASTY BOOK
Freq: Once | Status: AC
  Administered 2015-03-04: 23:00:00
  Filled 2015-03-04: qty 1

## 2015-03-04 MED ORDER — BIVALIRUDIN BOLUS VIA INFUSION - CUPID
INTRAVENOUS | Status: DC | PRN
  Administered 2015-03-04: 46.575 mg via INTRAVENOUS

## 2015-03-04 MED ORDER — SODIUM CHLORIDE 0.9 % IJ SOLN: 3.0000 mL | Freq: Two times a day (BID) | INTRAMUSCULAR | Status: DC

## 2015-03-04 MED ORDER — CLOPIDOGREL BISULFATE 75 MG PO TABS
75.0000 mg | ORAL_TABLET | Freq: Every day | ORAL | Status: DC
  Administered 2015-03-05: 75 mg via ORAL
  Filled 2015-03-04: qty 1

## 2015-03-04 MED ORDER — LIVING WELL WITH DIABETES BOOK
Freq: Once | Status: AC
  Administered 2015-03-04: 23:00:00
  Filled 2015-03-04: qty 1

## 2015-03-04 MED ORDER — SODIUM CHLORIDE 0.9 % IJ SOLN: 3.0000 mL | INTRAMUSCULAR | Status: DC | PRN

## 2015-03-04 MED ORDER — ADULT MULTIVITAMIN W/MINERALS CH
1.0000 | ORAL_TABLET | Freq: Every day | ORAL | Status: DC
  Filled 2015-03-04 (×3): qty 1

## 2015-03-04 MED ORDER — SODIUM CHLORIDE 0.9 % IV SOLN
250.0000 mg | INTRAVENOUS | Status: DC | PRN
  Administered 2015-03-04: 1.75 mg/kg/h via INTRAVENOUS

## 2015-03-04 MED ORDER — LIDOCAINE HCL 1 % IJ SOLN
INTRAMUSCULAR | Status: AC
  Filled 2015-03-04: qty 20

## 2015-03-04 MED ORDER — NITROGLYCERIN 1 MG/10 ML FOR IR/CATH LAB
INTRA_ARTERIAL | Status: DC | PRN
  Administered 2015-03-04: 200 ug via INTRACORONARY

## 2015-03-04 MED ORDER — HYDRALAZINE HCL 20 MG/ML IJ SOLN
INTRAMUSCULAR | Status: DC | PRN
  Administered 2015-03-04 (×2): 10 mg via INTRAVENOUS

## 2015-03-04 MED ORDER — CLOPIDOGREL BISULFATE 75 MG PO TABS
ORAL_TABLET | ORAL | Status: DC | PRN
  Administered 2015-03-04: 600 mg via ORAL

## 2015-03-04 MED ORDER — SODIUM CHLORIDE 0.9 % WEIGHT BASED INFUSION: 1.0000 mL/kg/h | INTRAVENOUS | Status: AC

## 2015-03-04 MED ORDER — TRAMADOL HCL 50 MG PO TABS
50.0000 mg | ORAL_TABLET | Freq: Four times a day (QID) | ORAL | Status: DC | PRN
  Administered 2015-03-05: 50 mg via ORAL
  Filled 2015-03-04: qty 1

## 2015-03-04 SURGICAL SUPPLY — 18 items
BALLN EMERGE MR 3.0X15 (BALLOONS) ×2
BALLN ~~LOC~~ EMERGE MR 3.25X20 (BALLOONS) ×2
BALLOON EMERGE MR 3.0X15 (BALLOONS) IMPLANT
BALLOON ~~LOC~~ EMERGE MR 3.25X20 (BALLOONS) IMPLANT
CATH INFINITI 5 FR 3DRC (CATHETERS) ×1 IMPLANT
CATH INFINITI 5FR MULTPACK ANG (CATHETERS) ×1 IMPLANT
GUIDE CATH RUNWAY 6FR CLS3.5 (CATHETERS) ×1 IMPLANT
KIT ENCORE 26 ADVANTAGE (KITS) ×1 IMPLANT
KIT HEART LEFT (KITS) ×2 IMPLANT
PACK CARDIAC CATHETERIZATION (CUSTOM PROCEDURE TRAY) ×2 IMPLANT
SHEATH PINNACLE 5F 10CM (SHEATH) ×1 IMPLANT
SHEATH PINNACLE 6F 10CM (SHEATH) ×1 IMPLANT
STENT SYNERGY DES 3X32 (Permanent Stent) ×1 IMPLANT
SYR MEDRAD MARK V 150ML (SYRINGE) ×2 IMPLANT
TRANSDUCER W/STOPCOCK (MISCELLANEOUS) ×2 IMPLANT
TUBING CIL FLEX 10 FLL-RA (TUBING) ×2 IMPLANT
WIRE ASAHI PROWATER 180CM (WIRE) ×1 IMPLANT
WIRE EMERALD 3MM-J .035X150CM (WIRE) ×1 IMPLANT

## 2015-03-04 NOTE — Progress Notes (Signed)
Patient received from cath lab at 1445.  Patient appeared sedated, though no sedation given in cath lab.  Opened eyes only to strong verbal stimuli.  Unable to state correct age, location, or year.  Grossly incontinent of urine.  Patient's son arrived and stated that none of the above were patient's baseline; that he was conversing with her on the way to the hospital this morning, that she is continent, and cooks at home.  Requested rapid response to assist in assessing patient for possible CVA.  Azalee Course, [place], also notified.  Patient seen and examined by the above and Dr. Royann Shivers, with the consensus that this was most likely not a CVA.  Patient has remained stable since that time, with a gradual improvement in LOC/awareness.  Ward Givens, NP, stopped to see patient at 39.

## 2015-03-04 NOTE — Progress Notes (Signed)
Attempt PIV x 2 without success.  Both arms completely assessed with ultrasound.

## 2015-03-04 NOTE — Progress Notes (Signed)
Called to patient's bedside because she is drowsy and was incontinent of urine.  She is post cardiac cath.  NHISS 6 due to drowsiness and stating that her age is 77, she also has bilat leg weakness at baseline..  She states that she is "blind" but actually she can see, but has poor vision.  She had difficulty seeing the pictures on the screen, but was able to easily identify objects in the room.  SBP initially 80s, 250cc fluid bolus given.  Now BP 144/45. SR 77 MD at bedside to assess patient.  RN to call if assistance needed.

## 2015-03-04 NOTE — Progress Notes (Signed)
Iv Therapy unable to obtain IV assess; Richardean Canal, RN notified

## 2015-03-04 NOTE — Interval H&P Note (Signed)
History and Physical Interval Note:  03/04/2015 1:08 PM  Mckenzie Williamson  has presented today for surgery, with the diagnosis of chest pain  The various methods of treatment have been discussed with the patient and family. After consideration of risks, benefits and other options for treatment, the patient has consented to  Procedure(s): Left Heart Cath and Cors/Grafts Angiography (N/A) as a surgical intervention .  The patient's history has been reviewed, patient examined, no change in status, stable for surgery.  I have reviewed the patient's chart and labs.  Questions were answered to the patient's satisfaction.   Cath Lab Visit (complete for each Cath Lab visit)  Clinical Evaluation Leading to the Procedure:   ACS: No.  Non-ACS:    Anginal Classification: CCS III  Anti-ischemic medical therapy: Maximal Therapy (2 or more classes of medications)  Non-Invasive Test Results: No non-invasive testing performed  Prior CABG: Previous CABG        Theron [facility] 03/04/2015 1:10 PM

## 2015-03-04 NOTE — Progress Notes (Addendum)
Paged by nursing staff for AMS post cath and PCI, seen the patient with Dr. Croitoru, family at bedside states pt is largely sedantary at home. Physical exam showed equal weak strength bilaterally, tongue midline, does have generalized weakness but no focal deficit. Per nursing staff, her SBP was in 80s initially, improved to 150s after <[phone]-EvImagene GurClaiborne BilKentuc70kAndrey CampanilGerilyn PilVibra Long Term A726-Donzetta StEthelene Cascade Eye985-6AMRBarnesEula ListenAlveda Cleotis L(650[facility] CenterCornelius Mo<[phone]-EvImagene GurClaiborne BilKentuc32kAndrey CampanilGerilyn PilCommunity Surgery An364-Donzetta StEthelene [facility] S(480) 6AMRBarnesEula ListenAlveda Cleotis L(367[facility] ConnectCornelius Mo<MEASUREMENT662-322-EvImagene GurClaiborne BilKentuc79kAndrey CampanilGerilyn PilPhysicia(860)Donzetta StEthelene [facility]mb [place]-Da614-2AMRBarnesEula ListenAlveda Cleotis L(714[facility] InCornelius Mo<MEASUREMENT361-102-EvImagene GurClaiborne BilKentuc3kAndrey CampanilGerilyn PilCrisp803-Donzetta StEthelene Kindred H917 4AMRBarnesEula ListenAlveda Cleotis L(317[facility] Cornelius Mo<MEASUREMENT780-079-EvImagene GurClaiborne BilKentuc51kAndrey CampanilGerilyn PilColu(701)Donzetta StEthelene Noland (915)8AMRBarnesEula ListenAlveda Cleotis L438[facility] CenterCornelius Mo<MEASUREMENT313709EvImagene GurClaiborne BilKentuc64kAndrey CampanilGerilyn PilEye Surgice81Donzetta StEthelene [place] - Amg Sp941-AMRBarnesEula ListenAlveda Cleotis L716[facility] HospCornelius Mo<MEASUREMENT([phone]-EvImagene GurClaiborne BilKentuc71kAndrey CampanilGerilyn PilAu(762)Donzetta StEthelene Hudson Cro(671)0AMRBarnesEula ListenAlveda Cleotis L2[facility] CCornelius Mo<MEASUREMENT623-498-EvImagene GurClaiborne BilKentuc63kAndrey CampanilGerilyn PilPhysicians 463-Donzetta StEthelene Mid-[place] Ex208-4AMRBarnesEula ListenAlveda Cleotis L(803)[facility] Cornelius Mo<MEASUREMENT([phone] 382-EvImagene GurClaiborne BilKentuc26kAndrey CampanilGerilyn PilBethany747 Donzetta StEthelene Deer 404AMRBarnesEula ListenAlveda Cleotis L(479[facility] Surgery CCornelius Mo<MEASUREMENT928280EvImagene GurClaiborne BilKentuc74kAndrey CampanilGerilyn PilPerimeter Center For Out(229) Donzetta StEthelene [place] Ambul629-1AMRBarnesEula ListenAlveda Cleotis L9[facility] CCornelius Mo<MEASUREMENT([phone] 688-EvImagene GurClaiborne B25iAndrey Camp712-Donzetta SChi H5Eula LisCleotis 480[facility] Trinity MCornelius Mo<MEASUREMENT([phone]-EvImagene GurClaiborne BilKentuc67kAndrey CampanilGerilyn PilPaci701-Donzetta StEthelene [place] West913-AMRBarnesEula ListenAlveda Cleotis L646[facility] CCornelius Mo<MEASUREMENT470-633-EvImagene GurClaiborne BilKentuc72kAndrey CampanilGerilyn PilGarden Grove Hospital 281-Donzetta StEthelene Pr819-1AMRBarnesEula ListenAlveda Cleotis L714[facility] CenterCornelius Mo<MEASUREMENT614 528 EvImagene GurClaiborne BilKentuc72kAndrey CampanilGerilyn Pil(806)Donzetta StEthelene (250) AMRBarnesEula ListenAlveda Cleotis L6[facility] CCornelius Mo<MEASUREMENT435-255-EvImagene GurClaiborne BilKentuc65kAndrey CampanilGerilyn PilSolara Hospital 712 Donzetta StEthelene Swedish Medical Cen228-1AMRBarnesEula ListenAlveda Cleotis L8[facility] HospCornelius Mo<MEASUREMENT510-33Imagene GurClaiborne Billi209-Donzett620Eula Li(647[facility]<MEASUREMENT223-686-EvImagene GurClaiborne BilKentuc62kAndrey CampanilGerilyn PilPiedmont Mou214-Donzetta StEthelene St Mary'(519)AMRBarnesEula ListenAlveda Cleotis L925Banner Page HospCornelius Mo<MEASUREMENT917-500-EvImagene GurClaiborne BilKentuc110kAndrey CampanilGerilyn PilCoastal Digesti410-Donzetta StEthelene Upmc Horizo803 1AMRBarnesEula ListenAlveda Cleotis L340[facility] - JacCornelius Mo<MEASUREMENT385162EvImagene GurClaiborne BilKentuc94kAndrey CampanilGerilyn PilExcela Health Wes870-Donzetta StEthelene Surgery Ce902AMRBarnesEula ListenAlveda Cleotis L(417)[facility] Cornelius Mo<MEASUREMENT([phone])607-EvImagene GurClaiborne BilKentuc45kAndrey CampanilGerilyn PilManatee S(505)Donzetta StEthelene Madison V(218)5AMRBarnesEula ListenAlveda Cleotis L714[facility] Disease CenterCornelius Mo<MEASUREMENT216-795-EvImagene GurClaiborne BilKentuc55kAndrey CampanilGerilyn PilRock 623-Donzetta StEthelene Sterli6AMRBarnesEula ListenAlveda Cleotis L(762[facility]iatric CCornelius Mo<MEASUREMENT415-673-EvImagene GurClaiborne BilKentuc25kAndrey CampanilGerilyn PilNevada Regio321-Donzetta StEthelene Dreyer Medical Ambul539 AMRBarnesEula ListenAlveda Cleotis L281Central [place] HospCornelius Mo<MEASUREMENT332-619-EvImagene GurClaiborne BilKentuc21kAndrey CampanilGerilyn PilSoutheast Missouri Me(719) Donzetta StEthelene Providence Me234-1AMRBarnesEula ListenAlveda Cleotis L8[facility] Medical FoundaCornelius Mo<MEASUREMENT402-245-EvImagene GurClaiborne BilKentuc56kAndrey CampanilGerilyn PilBaylor Emerge24Donzetta StEthelene [place] Val905-1AMRBarnesEula ListenAlveda Cleotis L281[facility] HospCornelius Mo<MEASUREMENT([phone] 682-EvImagene GurClaiborne BilKentuc86kAndrey CampanilGerilyn PilSagecrest (915)Donzetta StEthelene [place] Reg801-0AMRBarnesEula ListenAlveda Cleotis L941[place] HospCornelius Mo<MEASUREMENT601-222-EvImagene GurClaiborne BilKentuc95kAndrey CampanilGerilyn PilSpartanburg Hospital Fo(475)Donzetta StEthel(520)4AMRBarnesEula ListenAlveda Cleotis L870[facility] And HospCornelius Mo<MEASUREMENT([phone] 626-EvImagene GurClaiborne B40iAndrey CampanSaint Lukes Surgery 254 Donzetta SThe862Eula LisCleotis 252Carls[place] Surgery CenteCornelius Mo<MEASUREMENT([phone]) 675-EvImagene GurClaiborne BilKentuc11kAndrey CampanilGerilyn PilEdgerton Hospital A435-Donzetta StEthelene Centr709-AMRBarnesEula ListenAlveda Cleotis L(671[facility] Eye CCornelius Mo<MEASUREMENT339-069-EvImagene GurClaiborne BilKentuc75kAndrey CampanilGerilyn PilFillmo(385)Donzetta StEthelene St Ma709-4AMRBarnesEula ListenAlveda Cleotis L(617)[facility] PinecCornelius Mo<MEASUREMENT(229) 844-EvImagene GurClaiborne BilKentuc72kAndrey CampanilGerilyn PilEvergreen Hospi713-Donzetta StEthelene Upmc Mon231-1AMRBarnesEula ListenAlveda Cleotis L763Florida Medical CliniCornelius Mo<MEASUREMENT616-075-EvImagene GurClaiborne Billin57gAndrey CampanLaser And Surgical Services At Ce(773)Donzett(231) 3947Eula LisCleotis 4[facility] ClearCornelius Mo<MEASUREMENT786-696-EvImagene GurClaiborne BilKentuc57kAndrey CampanilGerilyn PilOak Valley Distri854-Donzetta StE980AMRBarnesEula ListenAlveda Cleotis L385Tradition Surgery CCornelius Mo<MEASUREMENT440596EvImagene GurClaiborne BilKentuc71kAndrey CampanilGerilyn Pil236 Donzetta StEthelene [facility] Pa640 6AMRBarnesEula ListenAlveda Cleotis L320Va [facility] SyCornelius Mo<MEASUREMENT986-831-EvImagene GurClaiborne BilKentuc21kAndrey CampanilGerilyn PilHon(503)Donzetta StEthelene Rio(310) 7AMRBarnesEula ListenAlveda Cleotis L(854)Mcpherson HospitalCornelius Mo<MEASUREMENT701-741-EvImagene GurClaiborne BilKentuc25kAndrey CampanilGerilyn PilSt Louis Eye Sur571-Donzetta StEthelene [facility] Whi505 AMRBarnesEula ListenAlveda Cleotis L(503Inland Surgery CenteCornelius Mo<MEASUREMENT351670EvImagene GurClaiborne BilKentuc39kAndrey CampanilGerilyn PilCharleston705-Donzetta StEthelene Dige925AMRBarnesEula ListenAlveda Cleotis L(416)[facility] HospCornelius Mo<MEASUREMENT([phone])471-EvImagene GurClaiborne BilKentuc38kAndrey CampanilGerilyn PilTourney Pla23Donzetta StEthelene Holmes Reg570-6AMRBarnesEula ListenAlveda Cleotis L(437[facility] Surgery CenterCornelius Mo<MEASUREMENT(706) 034-EvImagene GurClaiborne BilKentuc63kAndrey CampanilGerilyn PilSierra Ambulat431-Donzetta StEthelene Cla573-2AMRBarnesEula ListenAlveda Cleotis L814Medical [facility] HCornelius Mo<MEASUREMENT581-595-EvImagene GurClaiborne BilKentuc59kAndrey CampanilGerilyn PilJohn Hopkins All C(657)Donzetta StEthelene Ashtabula C(218)0AMRBarnesEula ListenAlveda Cleotis L818[facility] SemiCornelius Mo<MEASUREMENT215-032-EvImagene GurClaiborne BilKentuc54kAndrey CampanilGerilyn PilRf Eye Pc Dba Coc860-Donzetta StEthelene Peak Behavi(929)6AMRBarnesEula ListenAlveda Cleotis L(703)[facility] Surgery CenterCornelius Mo<MEASUREMENT([phone])882-EvImagene GurClaiborne BilKentuc35kAndrey CampanilGerilyn PilEndoscopy Ce224-Donzetta StEthelene Sunco337AMRBarnesEula ListenAlveda Cleotis L(701[facility] Of DaCornelius Mo<MEASUREMENT239-042-EvImagene GurClaiborne BilKentuc74kAndrey CampanilGerilyn PilPhoenix Ambulat(226)Donzetta StEthelene Pione253AMRBarnesEula ListenAlveda Cleotis L6[facility] HospCornelius Mo<MEASUREMENT469-718-EvImagene GurClaiborne BilKentuc33kAndrey CampanilGerilyn PilThe Sur53Donzetta StEthelene Docto(605)2AMRBarnesEula ListenAlveda Cleotis L(262[facility] LemCornelius Mo<MEASUREMENT337-761-EvImagene GurClaiborne BilKentuc40kAndrey CampanilGerilyn PilGarra541-Donzetta StEthelene Mountain Laure805-7AMRBarnesEula ListenAlveda Cleotis L606[facility] CCornelius Mo<MEASUREMENT(331)765-EvImagene GurClaiborne BilKentuc58kAndrey CampanilGerilyn PilMemorial Hospit575-Donzetta StEthelene Gallup I(248)AMRBarnesEula ListenAlveda Cleotis L252C[place] Cod HospCornelius MorasCanary Brimknctersssed with MD, likely related to drop in BP, and less likely acute CVA. Will ask night coverage NP to check on her clinical condition around 5PM to 5:30PM to make sure no further decline  Signed, Hao Meng PA Pager: [phone]  _____________    Follow-up on Ms. Nygard - she is much more alert and responds appropriately. VS stable. No focal deficits on exam.  RRR, CTA.  Continue to follow.  Velera Lansdale, NP 03/04/2015, 8:11 PM

## 2015-03-04 NOTE — Progress Notes (Signed)
Site area: right groin  Site Prior to Removal:  Level 0  Pressure Applied For 20 MINUTES    Minutes Beginning at 19:30  Manual:   Yes.    Patient Status During Pull:  WNL  Post Pull Groin Site:  Level 0  Post Pull Instructions Given:  Yes.    Post Pull Pulses Present:  Yes.    Dressing Applied:  Yes.    Comments:

## 2015-03-04 NOTE — H&P (View-Only) (Signed)
Mckenzie Williamson Date of Birth: [birth year] Medical Record #[id number]  History of Present Illness: [name] is seen at the request of Dr Janyth Contes for evaluation of CHF and CAD. She has not been seen by cardiology for over 3 years. She is seen with family today. She has multiple medical issues which include CAD with prior CABG back in 2005. Had repeat cath in 2008 showing that all of her vein grafts were occluded.LIMA to the LAD was patent. The OM was occluded after OM1. OM1 had a 70% stenosis. The RCA was occluded in the mid vessel with right to right and left to right collaterals. She also has a history of chronic CHF with EF 30-35%. Other issues include uncontrolled DM , anemia, HTN and HLD. She has a history of  Noncompliance but is doing better now with her family doling out her medications. She is nonambulatory and gets around in a wheel chair.   Today she states she is here for evaluation of chest pain. She complains of tightness and pain in the anterior chest sometimes radiating to her neck. It is associated with SOB. No diaphoresis. This typically occurs with any stress. Does not have Ntg to take. She was admitted to the hospital in July with hypoglycemia, acute CHF, and right hip pain. Echo at that time showed Moderate LVH with global hypokinesis and EF 34-40%. Moderate MR. Moderate LAE. She was diuresed and DC on lasix 40 mg daily. Also started on Coreg and lisinopril. Still has some ankle swelling. Doesn't weigh regularly but weight today down 140>>118. No orthopnea or PND.   Current Outpatient Prescriptions on File Prior to Visit  Medication Sig Dispense Refill  . acetaminophen (TYLENOL) 325 MG tablet Take 2 tablets (650 mg total) by mouth every 4 (four) hours as needed for headache or mild pain.    Marland Kitchen aspirin (EQ ASPIRIN) 325 MG EC tablet Take 1 tablet (325 mg total) by mouth daily. 30 tablet 2  . docusate sodium (COLACE) 100 MG capsule Take 1 capsule (100 mg total) by mouth every  12 (twelve) hours. 60 capsule 0  . furosemide (LASIX) 20 MG tablet Take 2 tablets (40 mg total) by mouth daily.    . Insulin Glargine (LANTUS SOLOSTAR) 100 UNIT/ML Solostar Pen Inject 10 Units into the skin daily at 10 pm. 5 pen 0  . lisinopril (PRINIVIL,ZESTRIL) 10 MG tablet Take 1 tablet (10 mg total) by mouth daily. 30 tablet 3  . Multiple Vitamin (MULTIVITAMIN) tablet Take 1 tablet by mouth daily.      . pantoprazole (PROTONIX) 40 MG tablet Take 1 tablet (40 mg total) by mouth daily before breakfast.    . polyethylene glycol (MIRALAX) packet Take 17 g by mouth daily as needed for mild constipation. 14 each 0  . potassium chloride SA (K-DUR,KLOR-CON) 20 MEQ tablet Take 1 tablet (20 mEq total) by mouth daily.    . pravastatin (PRAVACHOL) 40 MG tablet Take 1 tablet (40 mg total) by mouth daily. 30 tablet 3  . traMADol (ULTRAM) 50 MG tablet Take 1 tablet (50 mg total) by mouth every 6 (six) hours as needed. 20 tablet 0  . insulin aspart (NOVOLOG) 100 UNIT/ML injection 0-15 Units, Subcutaneous, 3 times daily with meals CBG < 70: implement hypoglycemia protocol CBG 70 - 120: 0 units CBG 121 - 150: 2 units CBG 151 - 200: 3 units CBG 201 - 250: 5 units CBG 251 - 300: 8 units CBG 301 - 350: 11 units CBG 351 -  400: 15 units CBG > 400: call MD (Patient not taking: Reported on 02/28/2015)     No current facility-administered medications on file prior to visit.    No Known Allergies  Past Medical History  Diagnosis Date  . Coronary artery disease     s/p CABG in 2005; last cath in 2008 showing occlusion of all SVG's;   . Diabetes mellitus   . Hyperlipidemia   . Hypertension   . LV dysfunction     EF 30 to 35% per echo 3/13  . Dizzy   . Hypoglycemia   . Cataract   . CHF (congestive heart failure) (HCC)   . Non compliance w medication regimen   . Ischemic cardiomyopathy   . Memory difficulty     Past Surgical History  Procedure Laterality Date  . Coronary artery bypass graft  2005      THIS INCLUDED SAPHENOUS VEIN GRAFT TO THE PDA, SAPHENOUS VEIN GRAFT TO OBTUSE MARGINAL VESSEL, AS LIMA GRAFT TO THE LAD.  . Cardiac catheterization  2008    EF 50-55%. SHOWED OCCLUSION OF VEIN GRAFT  . Cholecystectomy    . Transthoracic echocardiogram  05/04/2004    EF 50-55%  . Cardiovascular stress test  12/24/2006    EF 47%    History  Smoking status  . Never Smoker   Smokeless tobacco  . Never Used    History  Alcohol Use No    Family History  Problem Relation Age of Onset  . Tuberculosis Mother   . Lung cancer Father   . Diabetes Sister   . Heart attack Brother   . Hypertension Brother   . Diabetes Brother     Review of Systems: The review of systems is per the HPI.  All other systems were reviewed and are negative.  Physical Exam: BP 150/62 mmHg  Pulse 64  Ht 5' 2.5" (1.588 m)  Wt 53.615 kg (118 lb 3.2 oz)  BMI 21.26 kg/m2 Patient is a pleasant elderly BF seen in a wheelchair. No acute distress.  Skin is warm and dry. Color is normal.   HEENT is unremarkable. Normocephalic/atraumatic. PERRL. Sclera are nonicteric. Neck is supple. No masses. No JVD or bruits.  Lungs are clear.  Cardiac exam shows a regular rate and rhythm. Normal S1-2. No gallop. Soft systolic murmur at the apex.  Abdomen is soft. No masses or bruits. Old surgical scar lower right abdomen.   Extremities show 1+ edema. Good pedal pulses. Right radial pulse is weak.  Alert and oriented x 3. Sometimes confused. Nonfocal.    LABORATORY DATA:  Lab Results  Component Value Date   WBC 8.8 12/19/2014   HGB 10.5* 12/19/2014   HCT 32.7* 12/19/2014   PLT 266 12/19/2014   GLUCOSE 179* 12/22/2014   CHOL 294* 12/01/2013   TRIG 355* 12/01/2013   HDL 39* 12/01/2013   LDLDIRECT 173.7 02/06/2012   LDLCALC 184* 12/01/2013   ALT 11* 11/10/2014   AST 16 11/10/2014   NA 140 12/22/2014   K 4.0 12/22/2014   CL 101 12/22/2014   CREATININE 0.97 12/22/2014   BUN 14 12/22/2014   CO2 32 12/22/2014    TSH 1.500 10/05/2014   INR 0.9 10/22/2007   HGBA1C 7.4* 12/19/2014   Echo 12/21/14: Study Conclusions  - Left ventricle: Wall thickness was increased in a pattern of moderate LVH. There was focal basal hypertrophy. Systolic function was moderately reduced. The estimated ejection fraction was in the range of 35% to 40%. Moderate hypokinesis   of the entire myocardium. Doppler parameters are consistent with abnormal left ventricular relaxation (grade 1 diastolic dysfunction). - Mitral valve: There was moderate regurgitation. - Left atrium: The atrium was moderately dilated.    Ecg 12/19/14: NSR with nonspecific TWA. I have personally reviewed and interpreted this study.  CXR 12/19/14- stable cardiomegaly.  Assessment / Plan: 1. CAD s/p CABG in 2005. Cardiac cath in 2008 confirmed occlusion of SVG to PDA and SVG to OM. The LIMA to the LAD is patent. She is still having class 3 angina. I personally reviewed her cath films from 2008. I think there is an opportunity for further revascularization. The OM 1 is suitable for stenting and the RCA looks like a good candidate for CTO PCI. This is providing that everything is stable. I have recommended increasing her medical therapy. Increase Coreg to 12.5 mg bid. Add Imdur 30 mg daily. Rx for sl Ntg given with instructions in use. I have recommended cardiac catheterization to relook at anatomy and to consider for PCI for symptom relief and improvement in LV function.  2. Chronic systolic CHF secondary to ischemic heart disease and HTN. Increase Coreg as noted. Continue current dose of lisinopril and lasix. Sodium restriction. Evaluate potential for revascularization   3. HTN - BP elevated. Medication changes as noted.  4. DM now on insulin  5. History of noncompliance with memory loss. Appears to be doing better with family support.   We will check labs today. Cardiac cath with possible PCI this Friday.May need to use femoral access if  radial pulse is still poor.  

## 2015-03-04 NOTE — Progress Notes (Signed)
Unable to obtain IV assess; IV therapy ordered

## 2015-03-04 NOTE — Procedures (Addendum)
Successful placement of dual lumen PICC line to right basilic vein. Length 37cm Tip at lower SVC/RA No complications Ready for use.  Brayton El PA-C 11:23 AM

## 2015-03-05 ENCOUNTER — Ambulatory Visit (HOSPITAL_COMMUNITY): Payer: Medicare Other

## 2015-03-05 ENCOUNTER — Ambulatory Visit (HOSPITAL_COMMUNITY): Payer: Self-pay

## 2015-03-05 ENCOUNTER — Encounter (HOSPITAL_COMMUNITY): Payer: Self-pay | Admitting: Physician Assistant

## 2015-03-05 DIAGNOSIS — E785 Hyperlipidemia, unspecified: Secondary | ICD-10-CM | POA: Diagnosis not present

## 2015-03-05 DIAGNOSIS — R1110 Vomiting, unspecified: Secondary | ICD-10-CM | POA: Insufficient documentation

## 2015-03-05 DIAGNOSIS — I2582 Chronic total occlusion of coronary artery: Secondary | ICD-10-CM | POA: Diagnosis not present

## 2015-03-05 DIAGNOSIS — I10 Essential (primary) hypertension: Secondary | ICD-10-CM

## 2015-03-05 DIAGNOSIS — R112 Nausea with vomiting, unspecified: Secondary | ICD-10-CM

## 2015-03-05 DIAGNOSIS — R072 Precordial pain: Secondary | ICD-10-CM

## 2015-03-05 DIAGNOSIS — I25119 Atherosclerotic heart disease of native coronary artery with unspecified angina pectoris: Secondary | ICD-10-CM | POA: Diagnosis not present

## 2015-03-05 DIAGNOSIS — I25719 Atherosclerosis of autologous vein coronary artery bypass graft(s) with unspecified angina pectoris: Secondary | ICD-10-CM | POA: Diagnosis not present

## 2015-03-05 DIAGNOSIS — I110 Hypertensive heart disease with heart failure: Secondary | ICD-10-CM | POA: Diagnosis not present

## 2015-03-05 DIAGNOSIS — I5022 Chronic systolic (congestive) heart failure: Secondary | ICD-10-CM

## 2015-03-05 LAB — CBC
HEMATOCRIT: 30.5 % — AB (ref 36.0–46.0)
HEMOGLOBIN: 10.3 g/dL — AB (ref 12.0–15.0)
MCH: 27.2 pg (ref 26.0–34.0)
MCHC: 33.8 g/dL (ref 30.0–36.0)
MCV: 80.7 fL (ref 78.0–100.0)
Platelets: 284 10*3/uL (ref 150–400)
RBC: 3.78 MIL/uL — ABNORMAL LOW (ref 3.87–5.11)
RDW: 16.2 % — AB (ref 11.5–15.5)
WBC: 7.6 10*3/uL (ref 4.0–10.5)

## 2015-03-05 LAB — BASIC METABOLIC PANEL
ANION GAP: 8 (ref 5–15)
BUN: 15 mg/dL (ref 6–20)
CALCIUM: 9.1 mg/dL (ref 8.9–10.3)
CO2: 26 mmol/L (ref 22–32)
Chloride: 106 mmol/L (ref 101–111)
Creatinine, Ser: 0.85 mg/dL (ref 0.44–1.00)
GFR calc Af Amer: >60 mL/min (ref >60)
GFR calc non Af Amer: >60 mL/min (ref >60)
GLUCOSE: 89 mg/dL (ref 65–99)
POTASSIUM: 4.2 mmol/L (ref 3.5–5.1)
Sodium: 140 mmol/L (ref 135–145)

## 2015-03-05 LAB — GLUCOSE, CAPILLARY
GLUCOSE-CAPILLARY: 60 mg/dL — AB (ref 65–99)
GLUCOSE-CAPILLARY: 92 mg/dL (ref 65–99)
Glucose-Capillary: 62 mg/dL — ABNORMAL LOW (ref 65–99)

## 2015-03-05 MED ORDER — ONDANSETRON 4 MG PO TBDP: 4.0000 mg | ORAL_TABLET | Freq: Three times a day (TID) | ORAL | Status: DC | PRN

## 2015-03-05 MED ORDER — NITROGLYCERIN 0.4 MG SL SUBL: 0.4000 mg | SUBLINGUAL_TABLET | SUBLINGUAL | Status: DC | PRN

## 2015-03-05 MED ORDER — ISOSORBIDE MONONITRATE ER 60 MG PO TB24: 60.0000 mg | ORAL_TABLET | Freq: Every day | ORAL | Status: AC

## 2015-03-05 MED ORDER — CLOPIDOGREL BISULFATE 75 MG PO TABS: 75.0000 mg | ORAL_TABLET | Freq: Every day | ORAL | Status: AC

## 2015-03-05 MED ORDER — ASPIRIN 81 MG PO CHEW: 81.0000 mg | CHEWABLE_TABLET | Freq: Every day | ORAL | Status: AC

## 2015-03-05 MED ORDER — PANTOPRAZOLE SODIUM 40 MG PO TBEC: 40.0000 mg | DELAYED_RELEASE_TABLET | Freq: Every day | ORAL | Status: AC

## 2015-03-05 NOTE — Discharge Summary (Signed)
Discharge Summary   Patient ID: Mckenzie Williamson,  MRN: [id number], DOB/AGE: Oct 11, 1932 81 y.o.  Admit date: 03/04/2015 Discharge date: 03/05/2015  Primary Care Provider: Abbey Chatters K Primary Cardiologist: Dr. Swaziland  Discharge Diagnoses Principal Problem:   Chest pain Active Problems:   Coronary artery disease   Hyperlipidemia   Chronic systolic congestive heart failure, NYHA class 2 (HCC)   Essential hypertension, benign   Angina pectoris (HCC)   Emesis   Allergies No Known Allergies  Procedures  Cardiac catheterization 03/04/2015 Conclusion     LM lesion, 40% stenosed.  Prox LAD to Mid LAD lesion, 100% stenosed.  1st Diag lesion, 80% stenosed. Small vessel.  Distal Cx lesion, 100% stenosed.  Ost RCA to Prox RCA lesion, 80% stenosed.  Mid RCA lesion, 100% stenosed.  SVG to OM 2 100% occlusion  SVG to PDA 100% occlusion  Dist LAD lesion, 90% stenosed.  There is moderate left ventricular systolic dysfunction.  Ost 2nd Mrg to 2nd Mrg lesion, 85% stenosed. Post intervention, there is a 0% residual stenosis.  Prox Cx to Mid Cx lesion, 50% stenosed. Post intervention, there is a 0% residual stenosis.  1. Severe 3 vessel obstructive CAD. 2. Patent LIMA to the LAD 3. Occluded SVG to OM 2 4. Occluded SVG to PDA 5. Moderate LV dysfunction. 6. Successful stenting of the proximal LCx into the large second diagonal with a DES.  Plan: continue DAPT for one year. Aggressive BP management with ACEi, Coreg, nitrates and hydralazine. Stress importance of medication compliance. Anticipate DC in am if stable.        Hospital Course  The patient is a 81 year old African-American female with past medical history of HTN, HLD, DM, chronic systolic HF with EF 30-35%, and CAD s/p CABG 2015 with known occluded vein grafts. She underwent cardiac catheterization in 2008 which showed occluded vein graft, patent LIMA to LAD, OM was occluded after OM1, OM1 had 70%  stenosis, RCA was occluded in mid vessel with right-to-right and left-to-right collaterals. She had history of noncompliance with memory loss, however her 2 sons has been managing her medications. She is largely sedentary at home and gets around in a wheelchair. She was seen by Dr. Swaziland in the clinic on 02/28/2015 at which time she continued to have class III angina. After discussing various options, she agreed to undergo diagnostic cardiac catheterization.  She underwent the planned procedure on 03/04/2015 which showed occluded SVG to OM2, occluded SVG to PDA, patent LIMA to LAD, 50% prox to mid LCx lesion and 85% OM2 lesion treated with DES. A PICC line was placed prior to the procedure due to lack of IV access. Post cath, she had altered mental status despite not receiving any sedation. Rapid response was called. It was noted she was previously hypertensive and after receiving IV hydralazine, her blood pressure dipped down to the 80s. It was felt that her mental status was related to the drop in the blood pressure. On exam, patient had no focal deficit, her weakness was generalized. Therefore, stroke workup was held as her symptom was likely related to blood pressure. She was given IV fluid with improvement of blood pressure. Her mental status he eventually improved. She was seen on the following morning on 10/80/2016, at which time she denies any chest discomfort or shortness of breath. She does have significant nausea. While the nurse was giving her IV Zofran, she had 3 episodes of projectile vomiting. On exam, she had mild abdominal pain was voluntary guarding.  She also has been incontinent with loose stool. Dr. Rennis Golden offered abdominal x-ray and keep her for observation longer, however patient refused and wished to go home. We will discontinue her PICC line prior to discharge. I have given her some PO Zofran to take home. She has been advised to seek medical attention if her symptom worsens.    Discharge  Vitals Blood pressure 130/45, pulse 77, temperature 99.1 F (37.3 C), temperature source Oral, resp. rate 18, height  (1.575 m), weight 138 lb 10.7 oz (62.9 kg), SpO2 99 %.  Filed Weights   03/05/15 0345  Weight: 138 lb 10.7 oz (62.9 kg)    Labs  CBC  Recent Labs  03/05/15 0341  WBC 7.6  HGB 10.3*  HCT 30.5*  MCV 80.7  PLT 284   Basic Metabolic Panel  Recent Labs  03/05/15 0341  NA 140  K 4.2  CL 106  CO2 26  GLUCOSE 89  BUN 15  CREATININE 0.85  CALCIUM 9.1    Disposition  Pt is being discharged home today in good condition.  Follow-up Plans & Appointments      Follow-up Information    Follow up with Norma Fredrickson, NP On 03/22/2015.   Specialties:  Nurse Practitioner, Interventional Cardiology, Cardiology, Radiology   Why:  10:30am. This is a previously scheduled followup, however Dr. Rennis Golden want you to have a closer followup, our scheduler will call you to change this followup to a 5-7 day followup   Contact information:   [address] [place] [address] [phone]       Discharge Medications    Medication List    STOP taking these medications        aspirin 325 MG EC tablet  Commonly known as:  EQ ASPIRIN  Replaced by:  aspirin 81 MG chewable tablet      TAKE these medications        acetaminophen 325 MG tablet  Commonly known as:  TYLENOL  Take 2 tablets (650 mg total) by mouth every 4 (four) hours as needed for headache or mild pain.     aspirin 81 MG chewable tablet  Chew 1 tablet (81 mg total) by mouth daily.     carvedilol 12.5 MG tablet  Commonly known as:  COREG  Take 1 tablet (12.5 mg total) by mouth 2 (two) times daily.     clopidogrel 75 MG tablet  Commonly known as:  PLAVIX  Take 1 tablet (75 mg total) by mouth daily with breakfast.     docusate sodium 100 MG capsule  Commonly known as:  COLACE  Take 1 capsule (100 mg total) by mouth every 12 (twelve) hours.     furosemide 20 MG tablet    Commonly known as:  LASIX  Take 2 tablets (40 mg total) by mouth daily.     HUMALOG KWIKPEN 100 UNIT/ML KiwkPen  Generic drug:  insulin lispro  Inject into the skin See admin instructions. Per Sliding Scale 71-120, 0 units:  121-150, 2 units:  151-200, 3 units:  201-250, 5 units:  251-300, 8 units:  301-350, 11 units:  351-400, 15 units:  401+, 15 units CALL MD     Insulin Glargine 100 UNIT/ML Solostar Pen  Commonly known as:  LANTUS SOLOSTAR  Inject 10 Units into the skin daily at 10 pm.     isosorbide mononitrate 60 MG 24 hr tablet  Commonly known as:  IMDUR  Take 1 tablet (60 mg total)  by mouth daily.     lisinopril 10 MG tablet  Commonly known as:  PRINIVIL,ZESTRIL  Take 1 tablet (10 mg total) by mouth daily.     multivitamin tablet  Take 1 tablet by mouth daily.     nitroGLYCERIN 0.4 MG SL tablet  Commonly known as:  NITROSTAT  Place 1 tablet (0.4 mg total) under the tongue every 5 (five) minutes as needed for chest pain.     ondansetron 4 MG disintegrating tablet  Commonly known as:  ZOFRAN ODT  Take 1 tablet (4 mg total) by mouth every 8 (eight) hours as needed for nausea or vomiting.     pantoprazole 40 MG tablet  Commonly known as:  PROTONIX  Take 1 tablet (40 mg total) by mouth daily before breakfast.     polyethylene glycol packet  Commonly known as:  MIRALAX  Take 17 g by mouth daily as needed for mild constipation.     potassium chloride SA 20 MEQ tablet  Commonly known as:  K-DUR,KLOR-CON  Take 1 tablet (20 mEq total) by mouth daily.     pravastatin 40 MG tablet  Commonly known as:  PRAVACHOL  Take 1 tablet (40 mg total) by mouth daily.     traMADol 50 MG tablet  Commonly known as:  ULTRAM  Take 1 tablet (50 mg total) by mouth every 6 (six) hours as needed.        Duration of Discharge Encounter   Greater than 30 minutes including physician time.  Ramond Dial PA-C Pager: [phone] 03/05/2015, 9:34 AM

## 2015-03-05 NOTE — Discharge Instructions (Signed)
Keep procedure site clean & dry. If you notice increased pain, swelling, bleeding or pus, call/return!  You may shower, but no soaking baths/hot tubs/pools for 1 week.   Please seek medical attention if nausea vomiting or abdominal pain get worse

## 2015-03-05 NOTE — Progress Notes (Signed)
Patient complained of nausea. Had three episodes of projectile vomiting during administration of IV Zofran. Stated she felt better after vomiting and denied nausea. Patient bathed and assisted to stand and ambulate a few steps to recliner. Poor posture, leaned forward, weak legs bilaterally. Shuffled when advancing to recliner. Encouraged to do strengthening exercises with assistance at home.  Resting in recliner. Vomiting reported to [facility] PA and Dr. Rennis Golden

## 2015-03-05 NOTE — Progress Notes (Signed)
Patient Name: Mckenzie Williamson Date of Encounter: 03/05/2015  Primary Cardiologist: Dr. Swaziland   Principal Problem:   Chest pain Active Problems:   Coronary artery disease   Hyperlipidemia   Chronic systolic congestive heart failure, NYHA class 2 (HCC)   Essential hypertension, benign   Angina pectoris (HCC)    SUBJECTIVE  Denies any CP or SOB. Nauseous this morning.   CURRENT MEDS . aspirin  81 mg Oral Daily  . carvedilol  12.5 mg Oral BID  . clopidogrel  75 mg Oral Q breakfast  . docusate sodium  100 mg Oral Q12H  . furosemide  40 mg Oral Daily  . hydrALAZINE  10 mg Oral 3 times per day  . insulin aspart  0-15 Units Subcutaneous TID WC  . insulin glargine  10 Units Subcutaneous Q2200  . isosorbide mononitrate  60 mg Oral Daily  . lisinopril  10 mg Oral Daily  . multivitamin with minerals  1 tablet Oral Daily  . pantoprazole  40 mg Oral QAC breakfast  . potassium chloride SA  20 mEq Oral Daily  . pravastatin  40 mg Oral Daily  . sodium chloride  3 mL Intravenous Q12H    OBJECTIVE  Filed Vitals:   03/04/15 1950 03/04/15 2000 03/05/15 0345 03/05/15 0600  BP: 126/61 157/46 133/46 145/44  Pulse: 78  77 73  Temp:   99.1 F (37.3 C)   TempSrc:   Oral   Resp: Height:      Weight:   138 lb 10.7 oz (62.9 kg)   SpO2: 100% 100%  100%   No intake or output data in the 24 hours ending 03/05/15 0802 Filed Weights   03/05/15 0345  Weight: 138 lb 10.7 oz (62.9 kg)    PHYSICAL EXAM  General: Pleasant, NAD. Neuro: Alert and oriented X 3. Moves all extremities spontaneously. Psych: Normal affect. HEENT:  Normal  Neck: Supple without bruits or JVD. Lungs:  Resp regular and unlabored, CTA. Heart: RRR no s3, s4, or murmurs. RUE PICC line noted.  Abdomen: Soft, non-tender, non-distended, BS + x 4.  R femoral cath site tender, but no bruit, bleeding, or palpable pulsatile mass Extremities: No clubbing, cyanosis or edema. DP/PT/Radials 2+ and equal  bilaterally.  Accessory Clinical Findings  CBC  Recent Labs  03/05/15 0341  WBC 7.6  HGB 10.3*  HCT 30.5*  MCV 80.7  PLT 284   Basic Metabolic Panel  Recent Labs  03/05/15 0341  NA 140  K 4.2  CL 106  CO2 26  GLUCOSE 89  BUN 15  CREATININE 0.85  CALCIUM 9.1    TELE NSR without significant ventricular ectopy    ECG  NSR with TWI in lateral leads  Echocardiogram 12/21/2014  LV EF: 35% -  40%  ------------------------------------------------------------------- Indications:   Chest pain 786.51.  ------------------------------------------------------------------- History:  PMH: Dizziness. Non-compliance with medication regimen. Ischemic cardiomyopathy. Memory difficulty. Coronary artery disease. Congestive heart failure. Risk factors: Hypertension. Diabetes mellitus. Dyslipidemia.  ------------------------------------------------------------------- Study Conclusions  - Left ventricle: Wall thickness was increased in a pattern of moderate LVH. There was focal basal hypertrophy. Systolic function was moderately reduced. The estimated ejection fraction was in the range of 35% to 40%. Moderate hypokinesis of the entire myocardium. Doppler parameters are consistent with abnormal left ventricular relaxation (grade 1 diastolic dysfunction). - Mitral valve: There was moderate regurgitation. - Left atrium: The atrium was moderately dilated.    Radiology/Studies  Ir US Guide Vasc  Access Right  03/04/2015   CLINICAL DATA:  Poor venous access. Request PICC line placement prior to cardiology procedure.  EXAM: RIGHT UPPER EXTREMITY PICC LINE PLACEMENT WITH ULTRASOUND AND FLUOROSCOPIC GUIDANCE  FLUOROSCOPY TIME:  18 seconds  PROCEDURE: The patient was advised of the possible risks and complications and agreed to undergo the procedure. The patient was then brought to the angiographic suite for the procedure.  The right arm was prepped with  chlorhexidine, draped in the usual sterile fashion using maximum barrier technique (cap and mask, sterile gown, sterile gloves, large sterile sheet, hand hygiene and cutaneous antisepsis) and infiltrated locally with 1% Lidocaine.  Ultrasound demonstrated patency of the right basilic vein, and this was documented with an image. Under real-time ultrasound guidance, this vein was accessed with a 21 gauge micropuncture needle and image documentation was performed. A 0.018 wire was introduced in to the vein. Over this, a 5 [place] dual lumen power injectable PICC was advanced to the lower SVC/right atrial junction. Fluoroscopy during the procedure and fluoro spot radiograph confirms appropriate catheter position. The catheter was flushed and covered with a sterile dressing.  Catheter length: 37 cm  COMPLICATIONS: None immediate.  IMPRESSION: Successful right arm power injectable PICC line placement with ultrasound and fluoroscopic guidance. The catheter is ready for use.  Read by: Brayton El PA-C   Electronically Signed   By: Irish Lack M.D.   On: 03/04/2015 11:26   Ir Fluoro Guide Cv Midline Picc Right  03/04/2015   CLINICAL DATA:  Poor venous access. Request PICC line placement prior to cardiology procedure.  EXAM: RIGHT UPPER EXTREMITY PICC LINE PLACEMENT WITH ULTRASOUND AND FLUOROSCOPIC GUIDANCE  FLUOROSCOPY TIME:  18 seconds  PROCEDURE: The patient was advised of the possible risks and complications and agreed to undergo the procedure. The patient was then brought to the angiographic suite for the procedure.  The right arm was prepped with chlorhexidine, draped in the usual sterile fashion using maximum barrier technique (cap and mask, sterile gown, sterile gloves, large sterile sheet, hand hygiene and cutaneous antisepsis) and infiltrated locally with 1% Lidocaine.  Ultrasound demonstrated patency of the right basilic vein, and this was documented with an image. Under real-time ultrasound guidance, this  vein was accessed with a 21 gauge micropuncture needle and image documentation was performed. A 0.018 wire was introduced in to the vein. Over this, a 5 [place] dual lumen power injectable PICC was advanced to the lower SVC/right atrial junction. Fluoroscopy during the procedure and fluoro spot radiograph confirms appropriate catheter position. The catheter was flushed and covered with a sterile dressing.  Catheter length: 37 cm  COMPLICATIONS: None immediate.  IMPRESSION: Successful right arm power injectable PICC line placement with ultrasound and fluoroscopic guidance. The catheter is ready for use.  Read by: Brayton El PA-C   Electronically Signed   By: Irish Lack M.D.   On: 03/04/2015 11:26    ASSESSMENT AND PLAN  1. Class 3 Angina  - cath 03/04/2015 occluded SVG to OM2, occluded SVG to PDA, patent LIMA to LAD, 50% prox to mid LCx lesion and 85% OM2 lesion treated with DES   - discharge today after nausea improve with IV zofran. Continue ASA, plavix, statin, coreg, lasix, hydralazine, imdur and lisinopril.   - largely nonambulatory and gets around with a wheelchair. D/C PICC line prior to discharge. R groin cath site tender, no pulsatile mass or bruit noted, low suspicion for pseudoaneurysm  2. CAD s/p prior CABG 2015  - cath  2008, occluded vein grafts, patent LIMA to LAD. The OM was occluded after OM1. OM1 had a 70% stenosis. The RCA was occluded in the mid vessel with right to right and left to right collaterals  3. Chronic systolic HF with EF 30-35$%  4. HTN 5. HLD 6. DM 7. History of noncompliance with memory loss. Appears to be doing better with family support.   Signed, Azalee Course PA-C Pager: [phone]

## 2015-03-07 ENCOUNTER — Encounter (HOSPITAL_COMMUNITY): Payer: Self-pay | Admitting: Cardiology

## 2015-03-07 NOTE — Progress Notes (Signed)
CARDIAC REHAB PHASE I   Educated patient and son on exercise guidelines. Patient then began to projectile vomit. Nurse was in room and aware.   Shiann Kam, [place], [place] 03/07/2015 7:43 AM

## 2015-03-09 ENCOUNTER — Encounter: Payer: Self-pay | Admitting: Cardiology

## 2015-03-11 ENCOUNTER — Ambulatory Visit (INDEPENDENT_AMBULATORY_CARE_PROVIDER_SITE_OTHER): Payer: Medicare Other | Admitting: Cardiology

## 2015-03-11 ENCOUNTER — Encounter: Payer: Self-pay | Admitting: Cardiology

## 2015-03-11 VITALS — BP 138/78 | HR 63 | Ht 62.0 in | Wt 131.0 lb

## 2015-03-11 DIAGNOSIS — R072 Precordial pain: Secondary | ICD-10-CM

## 2015-03-11 DIAGNOSIS — I5022 Chronic systolic (congestive) heart failure: Secondary | ICD-10-CM | POA: Diagnosis not present

## 2015-03-11 DIAGNOSIS — I10 Essential (primary) hypertension: Secondary | ICD-10-CM | POA: Diagnosis not present

## 2015-03-11 NOTE — Patient Instructions (Addendum)
Medication Instructions:  Your physician recommends that you continue on your current medications as directed. Please refer to the Current Medication list given to you today.   Labwork: None ordered  Testing/Procedures: None ordered  Follow-Up: Your physician recommends that you schedule a follow-up appointment in:2 MONTHS WITH DR. SwazilandJORDAN.   Any Other Special Instructions Will Be Listed Below (If Applicable).

## 2015-03-11 NOTE — Progress Notes (Signed)
03/11/2015 Mckenzie Williamson   1933/02/06  [id number]  Primary Physician Sharon Seller, NP Primary Cardiologist: Dr. Swaziland   Reason for Visit/CC: Post hospital follow-up for chest pain/CAD  HPI:  The patient is a 81 year old African-American female with past medical history of HTN, HLD, DM, chronic systolic HF with EF 30-35%, and CAD s/p CABG 2015 with known occluded vein grafts. She underwent cardiac catheterization in 2008 which showed occluded vein graft, patent LIMA to LAD, OM was occluded after OM1, OM1 had 70% stenosis, RCA was occluded in mid vessel with right-to-right and left-to-right collaterals. She had history of noncompliance with memory loss, however her 2 sons has been managing her medications. She is largely sedentary at home and gets around in a wheelchair. She was seen by Dr. Swaziland in the clinic on 02/28/2015 at which time she continued to have class III angina. After discussing various options, she agreed to undergo diagnostic cardiac catheterization.  She underwent the planned procedure on 03/04/2015 which showed an occluded SVG to OM2, occluded SVG to PDA, patent LIMA to LAD, 50% prox to mid LCx lesion and 85% OM2 lesion treated with PCI + DES. EF 35-40% by cath. A PICC line was placed prior to the procedure due to lack of IV access. Post cath, she had altered mental status despite not receiving any sedation. Rapid response was called. It was noted she was previously hypertensive and after receiving IV hydralazine, her blood pressure dipped down to the 80s. It was felt that her mental status was related to the drop in the blood pressure. On exam, patient had no focal deficit, her weakness was generalized. Therefore, stroke workup was held as her symptom was likely related to blood pressure. She was given IV fluid with improvement of blood pressure. Her mental status he eventually improved. She otherwise had no other post-cath complications and had no recurrent chest  discomfort. Her PICC line was discontinued. She was discharged home with dual antiplatelet therapy with aspirin plus Plavix along with carvedilol, lisinopril, Imdur and Protonix.  She presents to clinic today for post hospital follow-up. She is accompanied by her son and her nextdoor neighbor who also serves as a Engineer, structural. She reports that she has done well since discharge. She denies any recurrent angina. She also denies dyspnea. Volume status has remained stable. She denies any edema, weight gain, orthopnea or PND. No syncope/near-syncope. She denies any right groin complications.  Her blood pressure stable in clinic today 138/78. EKG shows normal sinus rhythm with a heart rate of 63 bpm.     Current Outpatient Prescriptions  Medication Sig Dispense Refill  . acetaminophen (TYLENOL) 325 MG tablet Take 2 tablets (650 mg total) by mouth every 4 (four) hours as needed for headache or mild pain.    Marland Kitchen aspirin 81 MG chewable tablet Chew 1 tablet (81 mg total) by mouth daily.    . carvedilol (COREG) 12.5 MG tablet Take 1 tablet (12.5 mg total) by mouth 2 (two) times daily. 180 tablet 3  . clopidogrel (PLAVIX) 75 MG tablet Take 1 tablet (75 mg total) by mouth daily with breakfast. 90 tablet 3  . docusate sodium (COLACE) 100 MG capsule Take 1 capsule (100 mg total) by mouth every 12 (twelve) hours. 60 capsule 0  . furosemide (LASIX) 20 MG tablet Take 2 tablets (40 mg total) by mouth daily. 60 tablet 0  . Insulin Glargine (LANTUS SOLOSTAR) 100 UNIT/ML Solostar Pen Inject 10 Units into the skin daily at 10 pm.  5 pen 0  . insulin lispro (HUMALOG KWIKPEN) 100 UNIT/ML KiwkPen Inject into the skin See admin instructions. Per Sliding Scale 71-120, 0 units:  121-150, 2 units:  151-200, 3 units:  201-250, 5 units:  251-300, 8 units:  301-350, 11 units:  351-400, 15 units:  401+, 15 units CALL MD    . isosorbide mononitrate (IMDUR) 60 MG 24 hr tablet Take 1 tablet (60 mg total) by mouth daily. 30 tablet 11  .  lisinopril (PRINIVIL,ZESTRIL) 10 MG tablet Take 1 tablet (10 mg total) by mouth daily. 30 tablet 3  . Multiple Vitamin (MULTIVITAMIN) tablet Take 1 tablet by mouth daily.      . nitroGLYCERIN (NITROSTAT) 0.4 MG SL tablet Place 0.4 mg under the tongue every 5 (five) minutes as needed for chest pain (maxium 3 tablets per day).    . ondansetron (ZOFRAN ODT) 4 MG disintegrating tablet Take 1 tablet (4 mg total) by mouth every 8 (eight) hours as needed for nausea or vomiting. 20 tablet 0  . pantoprazole (PROTONIX) 40 MG tablet Take 1 tablet (40 mg total) by mouth daily before breakfast. 90 tablet 3  . polyethylene glycol (MIRALAX) packet Take 17 g by mouth daily as needed for mild constipation. 14 each 0  . potassium chloride SA (K-DUR,KLOR-CON) 20 MEQ tablet Take 1 tablet (20 mEq total) by mouth daily.    . pravastatin (PRAVACHOL) 40 MG tablet Take 1 tablet (40 mg total) by mouth daily. 30 tablet 3  . traMADol (ULTRAM) 50 MG tablet Take 1 tablet (50 mg total) by mouth every 6 (six) hours as needed. 20 tablet 0   No current facility-administered medications for this visit.    No Known Allergies  Social History   Social History  . Marital Status: Married    Spouse Name: N/A  . Number of Children: 3  . Years of Education: N/A   Occupational History  . Not on file.   Social History Main Topics  . Smoking status: Never Smoker   . Smokeless tobacco: Never Used  . Alcohol Use: No  . Drug Use: No  . Sexual Activity: Not Currently   Other Topics Concern  . Not on file   Social History Narrative     Review of Systems: General: negative for chills, fever, night sweats or weight changes.  Cardiovascular: negative for chest pain, dyspnea on exertion, edema, orthopnea, palpitations, paroxysmal nocturnal dyspnea or shortness of breath Dermatological: negative for rash Respiratory: negative for cough or wheezing Urologic: negative for hematuria Abdominal: negative for nausea, vomiting,  diarrhea, bright red blood per rectum, melena, or hematemesis Neurologic: negative for visual changes, syncope, or dizziness All other systems reviewed and are otherwise negative except as noted above.    Blood pressure 138/78, pulse 63, height 5\' 2"  (1.575 m), weight 131 lb (59.421 kg).  General appearance: alert, cooperative, no distress and in a wheel chair Neck: no carotid bruit and no JVD Lungs: clear to auscultation bilaterally Heart: regular rate and rhythm, S1, S2 normal, no murmur, click, rub or gallop Extremities: no LEE Pulses: 2+ and symmetric Skin: warm and dry Neurologic: Grossly normal  EKG NSR 63 bpm   ASSESSMENT AND PLAN:   1. CAD: s/p PCI + DES to OM2. Also with residual 50% prox to mid LCx lesion to be treated medically. Also with known  occluded SVG to OM2, occluded SVG to PDA and  patent LIMA to LAD. Stable w/o any recurrent CP or dyspnea. HR and BP both  stable. Continue DAPT with ASA + Plavix, BB, ACE-I and long acting Imdur.   2. Chronic systolic heart failure: EF by cath was 35-40%. Volume status is stable w/o edema. Lungs are CTAB. No dyspnea.  Continue beta blocker therapy with carvedilol and ACE inhibitor therapy with lisinopril. We discussed continuation of a low sodium diet.   3. HTN: BP is stable and well controlled.   4. HLD: on simvastatin.   5. DM: Followed by PCP.   PLAN  F/u with Dr. Swaziland in 2-3 months.   Robbie Lis PA-C 03/11/2015 10:39 AM

## 2015-03-22 ENCOUNTER — Ambulatory Visit: Payer: Medicare Other | Admitting: Nurse Practitioner

## 2015-03-30 ENCOUNTER — Encounter: Payer: Self-pay | Admitting: Nurse Practitioner

## 2015-03-30 ENCOUNTER — Ambulatory Visit: Payer: Medicare Other | Admitting: Internal Medicine

## 2015-04-29 ENCOUNTER — Encounter: Payer: Self-pay | Admitting: Nurse Practitioner

## 2015-04-29 ENCOUNTER — Ambulatory Visit: Payer: Medicare Other | Admitting: Internal Medicine

## 2015-05-31 ENCOUNTER — Emergency Department (HOSPITAL_COMMUNITY): Payer: Medicare Other

## 2015-05-31 ENCOUNTER — Inpatient Hospital Stay (HOSPITAL_COMMUNITY)
Admission: EM | Admit: 2015-05-31 | Discharge: 2015-06-29 | DRG: 296 | Disposition: E | Payer: Medicare Other | Attending: Pulmonary Disease | Admitting: Pulmonary Disease

## 2015-05-31 ENCOUNTER — Inpatient Hospital Stay (HOSPITAL_COMMUNITY): Payer: Medicare Other

## 2015-05-31 DIAGNOSIS — N179 Acute kidney failure, unspecified: Secondary | ICD-10-CM | POA: Diagnosis present

## 2015-05-31 DIAGNOSIS — R159 Full incontinence of feces: Secondary | ICD-10-CM | POA: Diagnosis present

## 2015-05-31 DIAGNOSIS — Z79899 Other long term (current) drug therapy: Secondary | ICD-10-CM

## 2015-05-31 DIAGNOSIS — Z66 Do not resuscitate: Secondary | ICD-10-CM | POA: Diagnosis present

## 2015-05-31 DIAGNOSIS — E11649 Type 2 diabetes mellitus with hypoglycemia without coma: Secondary | ICD-10-CM | POA: Diagnosis present

## 2015-05-31 DIAGNOSIS — Z515 Encounter for palliative care: Secondary | ICD-10-CM | POA: Diagnosis present

## 2015-05-31 DIAGNOSIS — I25810 Atherosclerosis of coronary artery bypass graft(s) without angina pectoris: Secondary | ICD-10-CM | POA: Diagnosis present

## 2015-05-31 DIAGNOSIS — D689 Coagulation defect, unspecified: Secondary | ICD-10-CM | POA: Diagnosis present

## 2015-05-31 DIAGNOSIS — J9690 Respiratory failure, unspecified, unspecified whether with hypoxia or hypercapnia: Secondary | ICD-10-CM | POA: Insufficient documentation

## 2015-05-31 DIAGNOSIS — J9600 Acute respiratory failure, unspecified whether with hypoxia or hypercapnia: Secondary | ICD-10-CM | POA: Diagnosis present

## 2015-05-31 DIAGNOSIS — I5023 Acute on chronic systolic (congestive) heart failure: Secondary | ICD-10-CM | POA: Diagnosis present

## 2015-05-31 DIAGNOSIS — K7200 Acute and subacute hepatic failure without coma: Secondary | ICD-10-CM | POA: Diagnosis present

## 2015-05-31 DIAGNOSIS — G931 Anoxic brain damage, not elsewhere classified: Secondary | ICD-10-CM | POA: Diagnosis present

## 2015-05-31 DIAGNOSIS — I469 Cardiac arrest, cause unspecified: Secondary | ICD-10-CM | POA: Diagnosis present

## 2015-05-31 DIAGNOSIS — Z833 Family history of diabetes mellitus: Secondary | ICD-10-CM

## 2015-05-31 DIAGNOSIS — J690 Pneumonitis due to inhalation of food and vomit: Secondary | ICD-10-CM | POA: Diagnosis present

## 2015-05-31 DIAGNOSIS — I25111 Atherosclerotic heart disease of native coronary artery with angina pectoris with documented spasm: Secondary | ICD-10-CM

## 2015-05-31 DIAGNOSIS — E872 Acidosis: Secondary | ICD-10-CM | POA: Diagnosis present

## 2015-05-31 DIAGNOSIS — E875 Hyperkalemia: Secondary | ICD-10-CM | POA: Diagnosis present

## 2015-05-31 DIAGNOSIS — I110 Hypertensive heart disease with heart failure: Secondary | ICD-10-CM | POA: Diagnosis present

## 2015-05-31 DIAGNOSIS — Z7982 Long term (current) use of aspirin: Secondary | ICD-10-CM

## 2015-05-31 DIAGNOSIS — Z951 Presence of aortocoronary bypass graft: Secondary | ICD-10-CM | POA: Diagnosis not present

## 2015-05-31 DIAGNOSIS — D696 Thrombocytopenia, unspecified: Secondary | ICD-10-CM | POA: Diagnosis present

## 2015-05-31 DIAGNOSIS — Z9114 Patient's other noncompliance with medication regimen: Secondary | ICD-10-CM

## 2015-05-31 DIAGNOSIS — E785 Hyperlipidemia, unspecified: Secondary | ICD-10-CM | POA: Diagnosis present

## 2015-05-31 DIAGNOSIS — Z8249 Family history of ischemic heart disease and other diseases of the circulatory system: Secondary | ICD-10-CM

## 2015-05-31 DIAGNOSIS — R32 Unspecified urinary incontinence: Secondary | ICD-10-CM | POA: Diagnosis present

## 2015-05-31 DIAGNOSIS — Z955 Presence of coronary angioplasty implant and graft: Secondary | ICD-10-CM

## 2015-05-31 DIAGNOSIS — I255 Ischemic cardiomyopathy: Secondary | ICD-10-CM | POA: Diagnosis present

## 2015-05-31 DIAGNOSIS — Z7902 Long term (current) use of antithrombotics/antiplatelets: Secondary | ICD-10-CM | POA: Diagnosis not present

## 2015-05-31 DIAGNOSIS — Z794 Long term (current) use of insulin: Secondary | ICD-10-CM

## 2015-05-31 DIAGNOSIS — Z029 Encounter for administrative examinations, unspecified: Secondary | ICD-10-CM

## 2015-05-31 DIAGNOSIS — D6489 Other specified anemias: Secondary | ICD-10-CM | POA: Diagnosis present

## 2015-05-31 LAB — POCT I-STAT 3, VENOUS BLOOD GAS (G3P V)
Acid-base deficit: 15 mmol/L — ABNORMAL HIGH (ref 0.0–2.0)
BICARBONATE: 14.2 meq/L — AB (ref 20.0–24.0)
O2 Saturation: 80 %
PH VEN: 7.019 — AB (ref 7.250–7.300)
TCO2: 16 mmol/L (ref 0–100)
pCO2, Ven: 53.9 mmHg — ABNORMAL HIGH (ref 45.0–50.0)
pO2, Ven: 61 mmHg — ABNORMAL HIGH (ref 30.0–45.0)

## 2015-05-31 LAB — CBC WITH DIFFERENTIAL/PLATELET
BASOS ABS: 0 10*3/uL (ref 0.0–0.1)
BASOS PCT: 0 %
EOS ABS: 0 10*3/uL (ref 0.0–0.7)
EOS PCT: 0 %
HCT: 32 % — ABNORMAL LOW (ref 36.0–46.0)
Hemoglobin: 9.9 g/dL — ABNORMAL LOW (ref 12.0–15.0)
LYMPHS PCT: 34 %
Lymphs Abs: 3.8 10*3/uL (ref 0.7–4.0)
MCH: 26.5 pg (ref 26.0–34.0)
MCHC: 30.9 g/dL (ref 30.0–36.0)
MCV: 85.6 fL (ref 78.0–100.0)
Monocytes Absolute: 0.4 10*3/uL (ref 0.1–1.0)
Monocytes Relative: 4 %
Neutro Abs: 7.1 10*3/uL (ref 1.7–7.7)
Neutrophils Relative %: 63 %
PLATELETS: 52 10*3/uL — AB (ref 150–400)
RBC: 3.74 MIL/uL — AB (ref 3.87–5.11)
RDW: 13.6 % (ref 11.5–15.5)
WBC: 11.3 10*3/uL — AB (ref 4.0–10.5)

## 2015-05-31 LAB — COMPREHENSIVE METABOLIC PANEL
ALT: 1196 U/L — AB (ref 14–54)
AST: 2079 U/L — ABNORMAL HIGH (ref 15–41)
Albumin: 2.5 g/dL — ABNORMAL LOW (ref 3.5–5.0)
Alkaline Phosphatase: 116 U/L (ref 38–126)
Anion gap: 25 — ABNORMAL HIGH (ref 5–15)
BUN: 38 mg/dL — ABNORMAL HIGH (ref 6–20)
CHLORIDE: 106 mmol/L (ref 101–111)
CO2: 13 mmol/L — ABNORMAL LOW (ref 22–32)
CREATININE: 3.06 mg/dL — AB (ref 0.44–1.00)
Calcium: 8.4 mg/dL — ABNORMAL LOW (ref 8.9–10.3)
GFR, EST AFRICAN AMERICAN: 16 mL/min — AB (ref >60)
GFR, EST NON AFRICAN AMERICAN: 13 mL/min — AB (ref >60)
Glucose, Bld: 51 mg/dL — ABNORMAL LOW (ref 65–99)
Potassium: 5.3 mmol/L — ABNORMAL HIGH (ref 3.5–5.1)
Sodium: 144 mmol/L (ref 135–145)
TOTAL PROTEIN: 5.9 g/dL — AB (ref 6.5–8.1)
Total Bilirubin: 1.5 mg/dL — ABNORMAL HIGH (ref 0.3–1.2)

## 2015-05-31 LAB — I-STAT CHEM 8, ED
BUN: 47 mg/dL — ABNORMAL HIGH (ref 6–20)
CALCIUM ION: 1.03 mmol/L — AB (ref 1.13–1.30)
Chloride: 108 mmol/L (ref 101–111)
Creatinine, Ser: 3 mg/dL — ABNORMAL HIGH (ref 0.44–1.00)
GLUCOSE: 43 mg/dL — AB (ref 65–99)
HCT: 32 % — ABNORMAL LOW (ref 36.0–46.0)
HEMOGLOBIN: 10.9 g/dL — AB (ref 12.0–15.0)
POTASSIUM: 5.2 mmol/L — AB (ref 3.5–5.1)
SODIUM: 141 mmol/L (ref 135–145)
TCO2: 15 mmol/L (ref 0–100)

## 2015-05-31 LAB — PROTIME-INR
INR: 2.39 — AB (ref 0.00–1.49)
PROTHROMBIN TIME: 25.8 s — AB (ref 11.6–15.2)

## 2015-05-31 LAB — I-STAT CG4 LACTIC ACID, ED: LACTIC ACID, VENOUS: 14.12 mmol/L — AB (ref 0.5–2.0)

## 2015-05-31 LAB — I-STAT ARTERIAL BLOOD GAS, ED
Acid-base deficit: 18 mmol/L — ABNORMAL HIGH (ref 0.0–2.0)
Bicarbonate: 11.9 mEq/L — ABNORMAL LOW (ref 20.0–24.0)
O2 Saturation: 93 %
PH ART: 6.998 — AB (ref 7.350–7.450)
TCO2: 13 mmol/L (ref 0–100)
pCO2 arterial: 48.6 mmHg — ABNORMAL HIGH (ref 35.0–45.0)
pO2, Arterial: 99 mmHg (ref 80.0–100.0)

## 2015-05-31 LAB — TROPONIN I: TROPONIN I: 1.16 ng/mL — AB (ref <0.031)

## 2015-05-31 LAB — ETHANOL: ALCOHOL ETHYL (B): 6 mg/dL — AB (ref <5)

## 2015-05-31 LAB — I-STAT TROPONIN, ED: TROPONIN I, POC: 1.05 ng/mL — AB (ref 0.00–0.08)

## 2015-05-31 LAB — BRAIN NATRIURETIC PEPTIDE: B NATRIURETIC PEPTIDE 5: 619.7 pg/mL — AB (ref 0.0–100.0)

## 2015-05-31 LAB — GLUCOSE, CAPILLARY
GLUCOSE-CAPILLARY: 45 mg/dL — AB (ref 65–99)
Glucose-Capillary: 115 mg/dL — ABNORMAL HIGH (ref 65–99)

## 2015-05-31 MED ORDER — SODIUM CHLORIDE 0.9 % IV BOLUS (SEPSIS)
1000.0000 mL | Freq: Once | INTRAVENOUS | Status: AC
  Administered 2015-05-31: 1000 mL via INTRAVENOUS

## 2015-05-31 MED ORDER — MIDAZOLAM HCL 2 MG/2ML IJ SOLN: 1.0000 mg | INTRAMUSCULAR | Status: DC | PRN

## 2015-05-31 MED ORDER — ASPIRIN 81 MG PO CHEW
81.0000 mg | CHEWABLE_TABLET | Freq: Every day | ORAL | Status: DC
Start: 2015-05-31 — End: 2015-05-31

## 2015-05-31 MED ORDER — DEXTROSE 50 % IV SOLN: 50.0000 mL | Freq: Once | INTRAVENOUS | Status: DC

## 2015-05-31 MED ORDER — PANTOPRAZOLE SODIUM 40 MG IV SOLR: 40.0000 mg | Freq: Every day | INTRAVENOUS | Status: DC

## 2015-05-31 MED ORDER — HEPARIN SODIUM (PORCINE) 5000 UNIT/ML IJ SOLN: 5000.0000 [IU] | Freq: Three times a day (TID) | INTRAMUSCULAR | Status: DC

## 2015-05-31 MED ORDER — EPINEPHRINE HCL 1 MG/ML IJ SOLN
0.5000 ug/min | INTRAVENOUS | Status: DC
  Administered 2015-05-31: 15 ug/min via INTRAVENOUS
  Filled 2015-05-31 (×3): qty 4

## 2015-05-31 MED ORDER — SODIUM BICARBONATE 8.4 % IV SOLN
INTRAVENOUS | Status: AC
  Filled 2015-05-31: qty 50

## 2015-05-31 MED ORDER — ATROPINE SULFATE 1 MG/ML IJ SOLN
INTRAMUSCULAR | Status: AC | PRN
  Administered 2015-05-31 (×2): 1 mg via INTRAVENOUS

## 2015-05-31 MED ORDER — SODIUM BICARBONATE 8.4 % IV SOLN
INTRAVENOUS | Status: AC | PRN
  Administered 2015-05-31 (×2): 100 meq via INTRAVENOUS

## 2015-05-31 MED ORDER — SODIUM CHLORIDE 0.9 % IV SOLN
2000.0000 mL | Freq: Once | INTRAVENOUS | Status: AC
  Administered 2015-05-31: 2000 mL via INTRAVENOUS

## 2015-05-31 MED ORDER — DEXTROSE 50 % IV SOLN
INTRAVENOUS | Status: AC | PRN
Start: 2015-05-31 — End: 2015-05-31
  Administered 2015-05-31: 1 via INTRAVENOUS

## 2015-05-31 MED ORDER — SODIUM CHLORIDE 0.9 % IV SOLN
25.0000 ug/h | INTRAVENOUS | Status: DC
  Filled 2015-05-31: qty 50

## 2015-05-31 MED ORDER — DEXTROSE-NACL 5-0.9 % IV SOLN: INTRAVENOUS | Status: DC

## 2015-05-31 MED ORDER — EPINEPHRINE HCL 0.1 MG/ML IJ SOSY
PREFILLED_SYRINGE | INTRAMUSCULAR | Status: AC
  Filled 2015-05-31: qty 10

## 2015-05-31 MED ORDER — CLOPIDOGREL BISULFATE 75 MG PO TABS: 75.0000 mg | ORAL_TABLET | Freq: Every day | ORAL | Status: DC

## 2015-05-31 MED ORDER — DEXTROSE 5 % IV SOLN
0.0000 ug/min | INTRAVENOUS | Status: DC
  Filled 2015-05-31: qty 4

## 2015-05-31 MED ORDER — INSULIN ASPART 100 UNIT/ML ~~LOC~~ SOLN: 0.0000 [IU] | SUBCUTANEOUS | Status: DC

## 2015-05-31 MED ORDER — EPINEPHRINE HCL 0.1 MG/ML IJ SOSY
PREFILLED_SYRINGE | INTRAMUSCULAR | Status: AC | PRN
  Administered 2015-05-31: 1 mg via INTRAVENOUS
  Administered 2015-05-31 (×2): 1 via INTRAVENOUS

## 2015-05-31 MED ORDER — FENTANYL BOLUS VIA INFUSION
25.0000 ug | INTRAVENOUS | Status: DC | PRN
  Filled 2015-05-31: qty 25

## 2015-05-31 MED ORDER — STERILE WATER FOR INJECTION IV SOLN
INTRAVENOUS | Status: DC
  Filled 2015-05-31: qty 850

## 2015-05-31 MED ORDER — SODIUM CHLORIDE 0.9 % IV SOLN: INTRAVENOUS | Status: DC

## 2015-05-31 MED FILL — Medication: Qty: 1 | Status: AC

## 2015-06-02 ENCOUNTER — Telehealth: Payer: Self-pay

## 2015-06-02 NOTE — Telephone Encounter (Signed)
On 06/02/2015 I received a death certificate from [facility] [employer] (original). The death certificate is for cremation. The patient is a patient of Doctor Sood. The death certificate will be taken to pulmonary unit at [facility] this pm for signature. On 06/03/2015 I received the death certificate back from Doctor BowdleSood. I got the death certificate ready and called the funeral home to let them know the death certificate is ready for pickup.

## 2015-06-20 ENCOUNTER — Ambulatory Visit: Payer: Medicare Other | Admitting: Cardiology

## 2015-06-29 NOTE — Procedures (Signed)
Arterial Catheter Insertion Procedure Note Aggie Moatslizabeth Lorraine Nofziger [id number] 1933/04/10  Procedure: Insertion of Arterial Catheter  Indications: Blood pressure monitoring and Frequent blood sampling  Procedure Details Consent: Risks of procedure as well as the alternatives and risks of each were explained to the (patient/caregiver).  Consent for procedure obtained. Time Out: Verified patient identification, verified procedure, site/side was marked, verified correct patient position, special equipment/implants available, medications/allergies/relevent history reviewed, required imaging and test results available.  Performed  Maximum sterile technique was used including antiseptics, cap, gloves, gown, hand hygiene, mask and sheet. Skin prep: Chlorhexidine; local anesthetic administered 20 gauge catheter was inserted into left femoral artery under ultrasound guidance using the Seldinger technique.  Evaluation Blood flow good; BP tracing good. Complications: No apparent complications.   Tally JoeMagddalene S Tukov, NP 05/30/2015

## 2015-06-29 NOTE — Discharge Summary (Addendum)
Mckenzie Williamson was a 82 y.o. female who was brought to ER after having cardiac arrest at home.  She required 45 minutes for ROSC in ER.  She had several more episodes of cardiac arrest.  She was seen by cardiology.  She was on pressors and intubated.  Family agreed to not proceed with any further resuscitative efforts.  She subsequently expired on 09/30/2015.  Final Diagnoses: Cardiac arrest Cardiogenic shock Acute on chronic systolic CHF Coronary artery disease Hx of HTN, Hyperlipidemia, valvular heart disease Acute kidney injury Metabolic acidosis Lactic acidosis Hyperkalemia Shock liver with elevated LFTs Coagulopathy Anemia of critical illness and chronic disease Thrombocytopenia Hypoglycemia Aspiration pneumonitis DM type II  Anoxic encephalopathy  Mckenzie HellingVineet Dahl Higinbotham, MD [facility] Pulmonary/Critical Care 06/23/2015, 5:26 PM

## 2015-06-29 NOTE — H&P (Signed)
PULMONARY / CRITICAL CARE MEDICINE   Name: Mckenzie Williamson MRN: [id number] DOB: 1932/07/25    ADMISSION DATE:  2015-06-26  REFERRING MD:  Dr. Criss Alvine  CHIEF COMPLAINT:  Cardiac arrest  HISTORY OF PRESENT ILLNESS:   Hx from chart, family and ER staff.  82 yo female with hx of CAD s/p CABG and systolic CHF had witnessed cardiac arrest at home.  She had caregiver with her, and her son was at home.  She was placed in chair, and then passed out.  Not sure if she had any complaints prior to this.  CPR was started in chair by caregiver.  EMS arrived, and she was brought to ER.  She had ROSC after about 45 minutes.  She was started on epinephrine gtt.  She was seen in ER by cardiology.  Her son reports that she had vomited during cardiac arrest, and it was all pooled in her mouth.  She had cardiac cath from 03/04/15 >> patent LIMA to LAD, occluded DVG to OM2, occluded SVG to PDA, successful stenting of proximal Lt Cx with DES.  Echo from 12/21/14 showed EF 35 to 40% and mod MR.  The ER staff has spoke with pt's daughter who is identified as POA, and daughter has requested that aggressive measures be continued.  PAST MEDICAL HISTORY :  She  has a past medical history of Coronary artery disease; Diabetes mellitus; Hyperlipidemia; Hypertension; LV dysfunction; Dizzy; Hypoglycemia; Cataract; CHF (congestive heart failure) (HCC); Non compliance w medication regimen; Ischemic cardiomyopathy; and Memory difficulty.  PAST SURGICAL HISTORY: She  has past surgical history that includes Coronary artery bypass graft (2005); Cardiac catheterization (2008); Cholecystectomy; transthoracic echocardiogram (05/04/2004); Cardiovascular stress test (12/24/2006); Cardiac catheterization (N/A, 03/04/2015); and Cardiac catheterization (N/A, 03/04/2015).  No Known Allergies  No current facility-administered medications on file prior to encounter.   Current Outpatient Prescriptions on File Prior to Encounter   Medication Sig  . acetaminophen (TYLENOL) 325 MG tablet Take 2 tablets (650 mg total) by mouth every 4 (four) hours as needed for headache or mild pain.  Marland Kitchen aspirin 81 MG chewable tablet Chew 1 tablet (81 mg total) by mouth daily.  . carvedilol (COREG) 12.5 MG tablet Take 1 tablet (12.5 mg total) by mouth 2 (two) times daily.  . clopidogrel (PLAVIX) 75 MG tablet Take 1 tablet (75 mg total) by mouth daily with breakfast.  . docusate sodium (COLACE) 100 MG capsule Take 1 capsule (100 mg total) by mouth every 12 (twelve) hours.  . furosemide (LASIX) 20 MG tablet Take 2 tablets (40 mg total) by mouth daily.  . Insulin Glargine (LANTUS SOLOSTAR) 100 UNIT/ML Solostar Pen Inject 10 Units into the skin daily at 10 pm.  . insulin lispro (HUMALOG KWIKPEN) 100 UNIT/ML KiwkPen Inject into the skin See admin instructions. Per Sliding Scale 71-120, 0 units:  121-150, 2 units:  151-200, 3 units:  201-250, 5 units:  251-300, 8 units:  301-350, 11 units:  351-400, 15 units:  401+, 15 units CALL MD  . isosorbide mononitrate (IMDUR) 60 MG 24 hr tablet Take 1 tablet (60 mg total) by mouth daily.  Marland Kitchen lisinopril (PRINIVIL,ZESTRIL) 10 MG tablet Take 1 tablet (10 mg total) by mouth daily.  . Multiple Vitamin (MULTIVITAMIN) tablet Take 1 tablet by mouth daily.    . nitroGLYCERIN (NITROSTAT) 0.4 MG SL tablet Place 0.4 mg under the tongue every 5 (five) minutes as needed for chest pain (maxium 3 tablets per day).  . ondansetron (ZOFRAN ODT) 4 MG disintegrating  tablet Take 1 tablet (4 mg total) by mouth every 8 (eight) hours as needed for nausea or vomiting.  . pantoprazole (PROTONIX) 40 MG tablet Take 1 tablet (40 mg total) by mouth daily before breakfast.  . polyethylene glycol (MIRALAX) packet Take 17 g by mouth daily as needed for mild constipation.  . potassium chloride SA (K-DUR,KLOR-CON) 20 MEQ tablet Take 1 tablet (20 mEq total) by mouth daily.  . pravastatin (PRAVACHOL) 40 MG tablet Take 1 tablet (40 mg total) by  mouth daily.  . traMADol (ULTRAM) 50 MG tablet Take 1 tablet (50 mg total) by mouth every 6 (six) hours as needed.    FAMILY HISTORY:  Her indicated that her mother is deceased. She indicated that her father is deceased. She indicated that her sister is alive. She indicated that only one of her two brothers is alive.   SOCIAL HISTORY: She  reports that she has never smoked. She has never used smokeless tobacco. She reports that she does not drink alcohol or use illicit drugs.  REVIEW OF SYSTEMS:   Unable to obtain  SUBJECTIVE:  In ER  VITAL SIGNS: BP 98/61 mmHg  Resp 22  Ht 5\' 6"  (1.676 m)  HEMODYNAMICS:    VENTILATOR SETTINGS: Vent Mode:  [-] PRVC FiO2 (%):  [100 %] 100 % Set Rate:  [18 bmp] 18 bmp Vt Set:  [480 mL] 480 mL PEEP:  [5 cmH20] 5 cmH20 Plateau Pressure:  [11 cmH20] 11 cmH20  INTAKE / OUTPUT:    PHYSICAL EXAMINATION: General:  obtunded Neuro:  Breathing over set rate on vent HEENT:  Pupils dilated, unequal Cardiovascular:  Regular, bradycardic, 2/6 SM Lungs:  B/l crackles Abdomen:  Soft, non tender Musculoskeletal:  Cool, 1+ edema Skin:  No rashes  LABS:  BMET  Recent Labs Lab 2016-02-24 1230 2016-02-24 1241  NA 144 141  K 5.3* 5.2*  CL 106 108  CO2 13*  --   BUN 38* 47*  CREATININE 3.06* 3.00*  GLUCOSE 51* 43*    Electrolytes  Recent Labs Lab 2016-02-24 1230  CALCIUM 8.4*    CBC  Recent Labs Lab 2016-02-24 1230 2016-02-24 1241  WBC 11.3*  --   HGB 9.9* 10.9*  HCT 32.0* 32.0*  PLT PENDING  --     Coag's  Recent Labs Lab 2016-02-24 1230  INR 2.39*    Sepsis Markers  Recent Labs Lab 2016-02-24 1241  LATICACIDVEN 14.12*    ABG No results for input(s): PHART, PCO2ART, PO2ART in the last 168 hours.  Liver Enzymes  Recent Labs Lab 2016-02-24 1230  AST 2079*  ALT 1196*  ALKPHOS 116  BILITOT 1.5*  ALBUMIN 2.5*    Cardiac Enzymes  Recent Labs Lab 2016-02-24 1230  TROPONINI 1.16*    Glucose No results for  input(s): GLUCAP in the last 168 hours.  Imaging Dg Chest Port 1 View  06/12/2015  CLINICAL DATA:  Post cardiac arrest, intubated EXAM: PORTABLE CHEST 1 VIEW COMPARISON:  03/04/2015 FINDINGS: Borderline cardiomegaly. Status post CABG. There is NG-tube coiled within proximal stomach endotracheal tube in place with tip 1.7 cm above the carina. There is no pneumothorax. No infiltrate or pulmonary edema. IMPRESSION: Endotracheal tube in place. No pneumothorax. NG tube coiled within proximal stomach. No infiltrate or pulmonary edema. Status post CABG. Electronically Signed   By: Natasha MeadLiviu  Pop M.D.   On: 2016-01-23 13:12     STUDIES:  1/03 CT head >> 1/03 EEG >>  CULTURES: 1/03 Blood >> 1/03 Sputum  ANTIBIOTICS:  SIGNIFICANT EVENTS: 1/03 Admit, cardiology consulted, normothermia protocol  LINES/TUBES: 1/03 ETT  DISCUSSION: 82 yo female with cardiac arrest with about 45 minutes before ROSC.  She has hx of CAD s/p CABG, and systolic CHF.  ASSESSMENT / PLAN:  PULMONARY A: Acute respiratory failure 2nd to cardiac arrest. P:   Full vent support F/u CXR, ABG  CARDIOVASCULAR A:  Cardiac arrest. Cardiogenic shock. Hx of CAD s/p CABG and stenting, HTN, HLD, valvular heart disese. P:  Pressors to keep MAP > 75 while on normothermia protocol  Continue ASA, Plavix Hold coreg, lasix, imdur, lisinopril, pravachol  RENAL A:   AKI >> baseline creatinine is 0.85 from 03/05/15. Anion gap metabolic acidosis with lactic acidosis. P:   Optimize hemodynamics Monitor renal fx, urine outpt, electrolytes F/u lactic acid  GASTROINTESTINAL A:   Nutrition. Shock liver with elevated LFTs. P:   NPO Protonix for SUP F/u LFTs  HEMATOLOGIC A:   Anemia of critical illness and chronic disease. P:  F/u CBC SQ heparin for DVT prevention  INFECTIOUS A:   Possible aspiration during cardiac arrest. P:   Monitor fever curve, WBC, CXR F/u blood, sputum cultures Hold Abx for  now  ENDOCRINE A:   Hypoglycemia. Hx of DM type II. P:   Dextrose in IV fluid SSI Hold outpt lantus  NEUROLOGIC A:   Acute encephalopathy 2nd to cardiac arrest. P:   F/u CT head, EEG Might need neurology assessment  Updated pt's son at bedside.  CC time 39 minutes.  Coralyn Helling, MD [facility] Pulmonary/Critical Care 06-06-2015, 1:41 PM Pager:  [phone] After 3pm call: [phone]

## 2015-06-29 NOTE — Progress Notes (Signed)
Ccm on the elink, pt was pronounced by dr. Jamison NeighborNestor at [phone], strip was obtained, dr. Genevive Binashier notified.

## 2015-06-29 NOTE — Progress Notes (Signed)
Fentanyl 250 cc wasted with nurse witness in sink

## 2015-06-29 NOTE — Care Management Note (Signed)
Case Management Note  Patient Details  Name: Mckenzie Williamson MRN: [id number] Date of Birth: December 23, 1932  Subjective/Objective:     Adm w arrest, vent               Action/Plan: lives at home w fam and has caregiver   Expected Discharge Date:                  Expected Discharge Plan:     In-House Referral:     Discharge planning Services     Post Acute Care Choice:    Choice offered to:     DME Arranged:    DME Agency:     HH Arranged:    HH Agency:     Status of Service:     Medicare Important Message Given:    Date Medicare IM Given:    Medicare IM give by:    Date Additional Medicare IM Given:    Additional Medicare Important Message give by:     If discussed at Long Length of Stay Meetings, dates discussed:    Additional Comments: ur review done  Hanley HaysDowell, Nautia Lem T, RN 06/08/2015, 3:41 PM

## 2015-06-29 NOTE — Progress Notes (Signed)
   2015-06-13 1200  Clinical Encounter Type  Visited With Family;Health care provider  Visit Type ED;Critical Care;Patient actively dying  Referral From Nurse  Spiritual Encounters  Spiritual Needs Emotional;Grief support  [facility] met with son and MD; Escorted pt son to consult B; [place] and MD notified that sister in [place] is HCPOA and son attempting to get number for contact; [place] [facility] will follow-up. 12:48 PM Gwynn Burly

## 2015-06-29 NOTE — Progress Notes (Signed)
     Pt coded several times in the ER and then 3 times up in the CCU. She had PEA arrest with each arrest. Was very responsive to Epi boluses and Epi drip . I talked with family with 2 different Chaplins. We were able to get in touch with his daughter who has power of attorney Mckenzie Fiscal(Lori)  She was made "comfort Care" and passed shortly thereafter.     Mckenzie Williamson, Mckenzie PingPhilip J, MD  06/07/2015 5:16 PM    [facility] [address],  [address] [place], [place]  [address] Pager [phone] Phone: [phone]; Fax: [phone]   [facility]  [address] [place], [place]  [address] [phone]   Fax [phone]

## 2015-06-29 NOTE — Code Documentation (Signed)
CBG: 115 

## 2015-06-29 NOTE — Progress Notes (Signed)
   Mar 18, 2016 1700  Clinical Encounter Type  Visited With Family;Health care provider  Visit Type Critical Care;Death  Referral From Nurse;Chaplain  Spiritual Encounters  Spiritual Needs Grief support;Emotional;Prayer  CH responding to follow-up for Code Blue; CH arranged and contacted HCPOA in [place] (daughter Jacki ConesLaur[place]) and MD explained prognosis; HCPOA and family determined to change status to comfort care and DNR; pt coded and expired; Family notified of death; RN will follow-up with care packet and pt placement info regarding funeral home.  Erline LevineMichael I Ahnesti Townsend 5:12 PM

## 2015-06-29 NOTE — ED Notes (Signed)
CPR restarted.

## 2015-06-29 NOTE — Progress Notes (Signed)
eLink Physician-Brief Progress Note Patient Name: Mckenzie Williamson DOB: 1932-09-15 MRN: [id number]   Date of Service  05/30/2015  HPI/Events of Note  Patient with repeat cardiac arrest. Hospital Chaplain at bedside reports the family now wants patient to be comfort care only. ACLS protocol stopped. Patient with asystole on monitor and absence of spontaneous respiratory effort or pulse as well as no heart beat or breath sounds on auscultation per bedside nurse report at 16:45 hours  eICU Interventions  Patient to be prepped for family viewing.        Lawanda CousinsJennings Jory Welke 06/19/2015, 4:46 PM

## 2015-06-29 NOTE — Code Documentation (Signed)
Pulses present  

## 2015-06-29 NOTE — Progress Notes (Signed)
   June 22, 2015 1630  Clinical Encounter Type  Visited With Family  Visit Type Code  Referral From Nurse  Spiritual Encounters  Spiritual Needs Other (Comment);Emotional (Discussion of life support)  Stress Factors  Family Stress Factors Loss  Chaplain responded to code blue and page to primary. Met with doctor and family. Asked doctor several questions with approval from family to ensure they had full knowledge of situation. Offered prayer for guidance in their decision-making. Passed case to unit chaplain.

## 2015-06-29 NOTE — Code Documentation (Signed)
Pt was getting ready to be transferred to CT then ICU-- pt became bradycardic then asystolic. Compressions started.

## 2015-06-29 NOTE — Code Documentation (Signed)
Family updated as to patient's status at bedside, son Loraine LericheMark

## 2015-06-29 NOTE — Consult Note (Signed)
CONSULT NOTE  Date: 2015-06-05               Patient Name:  Mckenzie Williamson MRN: [id number]  DOB: 1933-05-28 Age / Sex: 82 y.o., female        PCP: Sharon Seller Primary Cardiologist: [place]             Referring Physician: Criss Alvine              Reason for Consult: S/p cardiac arrest.             History of Present Illness: Patient is a 82 y.o. female with a PMHx of coronary artery disease-status post coronary artery bypass grafting in 2005., who was admitted to [facility] on 2015-06-05 following a cardiac arrest..   She is a previous patient of Dr. [name]. She's been followed by Dr. [place] recently. She was having some chest pain in October, 2016. Cardiac catheterization revealed Conclusion     LM lesion, 40% stenosed.  Prox LAD to Mid LAD lesion, 100% stenosed.  1st Diag lesion, 80% stenosed. Small vessel.  Distal Cx lesion, 100% stenosed.  Ost RCA to Prox RCA lesion, 80% stenosed.  Mid RCA lesion, 100% stenosed.  SVG to OM 2 100% occlusion  SVG to PDA 100% occlusion  Dist LAD lesion, 90% stenosed.  There is moderate left ventricular systolic dysfunction.  Ost 2nd Mrg to 2nd Mrg lesion, 85% stenosed. Post intervention, there is a 0% residual stenosis.  Prox Cx to Mid Cx lesion, 50% stenosed. Post intervention, there is a 0% residual stenosis.  1. Severe 3 vessel obstructive CAD. 2. Patent LIMA to the LAD 3. Occluded SVG to OM 2 4. Occluded SVG to PDA 5. Moderate LV dysfunction. 6. Successful stenting of the proximal LCx into the large second diagonal with a DES.  Plan: continue DAPT for one year. Aggressive BP management with ACEi, Coreg, nitrates and hydralazine. Stress importance of medication compliance. Anticipate DC in am if stable.   She was found unresponsive in her home. EMS was called. She had CPR for about 45 minutes. She is brought to the [facility] emergency room.  She was not able to give any hx. Currently intubated,  ventilated.  BP is stable currently but she is on an Epi drip .   Medications: Outpatient medications:  (Not in a hospital admission)  Current medications: Current Facility-Administered Medications  Medication Dose Route Frequency Provider Last Rate Last Dose  . atropine injection   Intravenous PRN Pricilla Loveless, MD   1 mg at 06-05-15 1234  . EPINEPHrine (ADRENALIN) 0.1 MG/ML injection   Intravenous PRN Pricilla Loveless, MD   1 mg at June 05, 2015 1235  . EPINEPHrine (ADRENALIN) 4 mg in dextrose 5 % 250 mL (0.016 mg/mL) infusion  0.5-20 mcg/min Intravenous Titrated Pricilla Loveless, MD      . sodium chloride 0.9 % bolus 1,000 mL  1,000 mL Intravenous Once Pricilla Loveless, MD       Current Outpatient Prescriptions  Medication Sig Dispense Refill  . acetaminophen (TYLENOL) 325 MG tablet Take 2 tablets (650 mg total) by mouth every 4 (four) hours as needed for headache or mild pain.    Marland Kitchen aspirin 81 MG chewable tablet Chew 1 tablet (81 mg total) by mouth daily.    . carvedilol (COREG) 12.5 MG tablet Take 1 tablet (12.5 mg total) by mouth 2 (two) times daily. 180 tablet 3  . clopidogrel (PLAVIX) 75 MG tablet Take 1 tablet (75  mg total) by mouth daily with breakfast. 90 tablet 3  . docusate sodium (COLACE) 100 MG capsule Take 1 capsule (100 mg total) by mouth every 12 (twelve) hours. 60 capsule 0  . furosemide (LASIX) 20 MG tablet Take 2 tablets (40 mg total) by mouth daily. 60 tablet 0  . Insulin Glargine (LANTUS SOLOSTAR) 100 UNIT/ML Solostar Pen Inject 10 Units into the skin daily at 10 pm. 5 pen 0  . insulin lispro (HUMALOG KWIKPEN) 100 UNIT/ML KiwkPen Inject into the skin See admin instructions. Per Sliding Scale 71-120, 0 units:  121-150, 2 units:  151-200, 3 units:  201-250, 5 units:  251-300, 8 units:  301-350, 11 units:  351-400, 15 units:  401+, 15 units CALL MD    . isosorbide mononitrate (IMDUR) 60 MG 24 hr tablet Take 1 tablet (60 mg total) by mouth daily. 30 tablet 11  . lisinopril  (PRINIVIL,ZESTRIL) 10 MG tablet Take 1 tablet (10 mg total) by mouth daily. 30 tablet 3  . Multiple Vitamin (MULTIVITAMIN) tablet Take 1 tablet by mouth daily.      . nitroGLYCERIN (NITROSTAT) 0.4 MG SL tablet Place 0.4 mg under the tongue every 5 (five) minutes as needed for chest pain (maxium 3 tablets per day).    . ondansetron (ZOFRAN ODT) 4 MG disintegrating tablet Take 1 tablet (4 mg total) by mouth every 8 (eight) hours as needed for nausea or vomiting. 20 tablet 0  . pantoprazole (PROTONIX) 40 MG tablet Take 1 tablet (40 mg total) by mouth daily before breakfast. 90 tablet 3  . polyethylene glycol (MIRALAX) packet Take 17 g by mouth daily as needed for mild constipation. 14 each 0  . potassium chloride SA (K-DUR,KLOR-CON) 20 MEQ tablet Take 1 tablet (20 mEq total) by mouth daily.    . pravastatin (PRAVACHOL) 40 MG tablet Take 1 tablet (40 mg total) by mouth daily. 30 tablet 3  . traMADol (ULTRAM) 50 MG tablet Take 1 tablet (50 mg total) by mouth every 6 (six) hours as needed. 20 tablet 0     No Known Allergies   Past Medical History  Diagnosis Date  . Coronary artery disease     s/p CABG in 2005; cath in 2008 showing occlusion of all SVG's; cath 03/04/2015 DES to midLCx into OM2  . Diabetes mellitus   . Hyperlipidemia   . Hypertension   . LV dysfunction     EF 30 to 35% per echo 3/13  . Dizzy   . Hypoglycemia   . Cataract   . CHF (congestive heart failure) (HCC)   . Non compliance w medication regimen   . Ischemic cardiomyopathy   . Memory difficulty     Past Surgical History  Procedure Laterality Date  . Coronary artery bypass graft  2005    THIS INCLUDED SAPHENOUS VEIN GRAFT TO THE PDA, SAPHENOUS VEIN GRAFT TO OBTUSE MARGINAL VESSEL, AS LIMA GRAFT TO THE LAD.  Marland Kitchen Cardiac catheterization  2008    EF 50-55%. SHOWED OCCLUSION OF VEIN GRAFT  . Cholecystectomy    . Transthoracic echocardiogram  05/04/2004    EF 50-55%  . Cardiovascular stress test  12/24/2006    EF 47%  .  Cardiac catheterization N/A 03/04/2015    Procedure: Left Heart Cath and Cors/Grafts Angiography;  Surgeon: Peter M [place], MD;  Location: [facility] INVASIVE CV LAB;  Service: Cardiovascular;  Laterality: N/A;  . Cardiac catheterization N/A 03/04/2015    Procedure: Coronary Stent Intervention;  Surgeon: Peter M [place], MD;  Location: [facility] INVASIVE CV LAB;  Service: Cardiovascular;  Laterality: N/A;    Family History  Problem Relation Age of Onset  . Tuberculosis Mother   . Lung cancer Father   . Diabetes Sister   . Heart attack Brother   . Hypertension Brother   . Diabetes Brother     Social History:  reports that she has never smoked. She has never used smokeless tobacco. She reports that she does not drink alcohol or use illicit drugs.   Review of Systems: Currently unobtainable                                      Physical Exam: Ht 5\' 6"  (1.676 m)  Wt Readings from Last 3 Encounters:  03/11/15 131 lb (59.421 kg)  03/05/15 138 lb 10.7 oz (62.9 kg)  03/01/15 137 lb (62.143 kg)    General: Vital signs reviewed and noted. Chronically ill appearing.  Intubated. On the vent  Head: Normocephalic, atraumatic, sclera anicteric,  Pupils are not dilated   Neck: Supple. Negative for carotid bruits. No JVD   Lungs:  On the vent   Heart: RR ,  Weak radial pulses, good femoral pulses.   Abdomen/ GI :  Not distended   MSK:   Extremities: Arms and legs are cool ( has started the cooling process ) ,  Neurologic:  occasionally takes a breath. Otherwise, no movement   Psych: NA     Lab results: Basic Metabolic Panel:  Recent Labs Lab 03/23/16 1241  NA 141  K 5.2*  CL 108  GLUCOSE 43*  BUN 47*  CREATININE 3.00*    Liver Function Tests: No results for input(s): AST, ALT, ALKPHOS, BILITOT, PROT, ALBUMIN in the last 168 hours. No results for input(s): LIPASE, AMYLASE in the last 168 hours. No results for input(s): AMMONIA in the last 168 hours.  CBC:  Recent Labs Lab  03/23/16 1241  HGB 10.9*  HCT 32.0*    Cardiac Enzymes: No results for input(s): CKTOTAL, CKMB, CKMBINDEX, TROPONINI in the last 168 hours.  BNP: Invalid input(s): POCBNP  CBG: No results for input(s): GLUCAP in the last 168 hours.  Coagulation Studies: No results for input(s): LABPROT, INR in the last 72 hours.   Other results:  Personal review of EKG shows :  - Ectopic atrial rhythm. She has no ST depression or elevation.   Imaging:  No results found.       Assessment & Plan:  1. Status post cardiac arrest: Patient presented with a PE a cardiac arrest. She had CPR for approximate 45 minutes. She's now on Epi drip and has blood pressure of around 121. She has started the cooling protocol.  Her lactic acid level is 14.12. She had very prolonged down time-45 minutes.   Her creatinine is up to 3.0. Her baseline creatinine is around 0.85. At this time we will not pursue cardiac catheterization. What to see if she makes a neurologic improvement. We can consider cardiac catheterization later time.  She'll be admitted by Pulmonary /critical care medicine.    Vesta MixerPhilip J. Nahser, Montez HagemanJr., MD, [facility] Mason Medical CenterFACC 05/30/2015, 12:53 PM Office - [phone] Pager [phone]- [phone]

## 2015-06-29 NOTE — Code Documentation (Signed)
Pt has frothy bright red blood coming from mouth-- suctioned. Critical Care team at bedside, Doctor talking with family.

## 2015-06-29 NOTE — ED Provider Notes (Signed)
CSN: [id number]     Arrival date & time 06/12/15  1215 History   First MD Initiated Contact with Patient Jun 12, 2015 1223     Chief Complaint  Patient presents with  . cpr      (Consider location/radiation/quality/duration/timing/severity/associated sxs/prior Treatment) HPI  81 year old female presents in cardiac arrest. History is taken from EMS. They state that the patient was being bathed and then had a witnessed arrest. She did not fall or hit her head. There was about 10 minutes in between the unresponsive episode and first responder arrival. No CPR was performed until the fire department arrived. In total she has had CPR for about 45 minutes. Received 5 epinephrine's. Has been in PEA the entire time without return of spontaneous circulation.   Past Medical History  Diagnosis Date  . Coronary artery disease     s/p CABG in 2005; cath in 2008 showing occlusion of all SVG's; cath 03/04/2015 DES to midLCx into OM2  . Diabetes mellitus   . Hyperlipidemia   . Hypertension   . LV dysfunction     EF 30 to 35% per echo 3/13  . Dizzy   . Hypoglycemia   . Cataract   . CHF (congestive heart failure) (HCC)   . Non compliance w medication regimen   . Ischemic cardiomyopathy   . Memory difficulty    Past Surgical History  Procedure Laterality Date  . Coronary artery bypass graft  2005    THIS INCLUDED SAPHENOUS VEIN GRAFT TO THE PDA, SAPHENOUS VEIN GRAFT TO OBTUSE MARGINAL VESSEL, AS LIMA GRAFT TO THE LAD.  Marland Kitchen Cardiac catheterization  2008    EF 50-55%. SHOWED OCCLUSION OF VEIN GRAFT  . Cholecystectomy    . Transthoracic echocardiogram  05/04/2004    EF 50-55%  . Cardiovascular stress test  12/24/2006    EF 47%  . Cardiac catheterization N/A 03/04/2015    Procedure: Left Heart Cath and Cors/Grafts Angiography;  Surgeon: Peter M [place], MD;  [facility] INVASIVE CV LAB;  Service: Cardiovascular;  Laterality: N/A;  . Cardiac catheterization N/A 03/04/2015    Procedure: Coronary Stent  Intervention;  Surgeon: Peter M [place], MD;  Location: [facility] INVASIVE CV LAB;  Service: Cardiovascular;  Laterality: N/A;   Family History  Problem Relation Age of Onset  . Tuberculosis Mother   . Lung cancer Father   . Diabetes Sister   . Heart attack Brother   . Hypertension Brother   . Diabetes Brother    Social History  Substance Use Topics  . Smoking status: Never Smoker   . Smokeless tobacco: Never Used  . Alcohol Use: No   OB History    No data available     Review of Systems  Unable to perform ROS: Patient unresponsive      Allergies  Review of patient's allergies indicates no known allergies.  Home Medications   Prior to Admission medications   Medication Sig Start Date End Date Taking? Authorizing Provider  acetaminophen (TYLENOL) 325 MG tablet Take 2 tablets (650 mg total) by mouth every 4 (four) hours as needed for headache or mild pain. 12/24/14   Shanker Levora Dredge, MD  aspirin 81 MG chewable tablet Chew 1 tablet (81 mg total) by mouth daily. 03/05/15   Azalee Course, PA  carvedilol (COREG) 12.5 MG tablet Take 1 tablet (12.5 mg total) by mouth 2 (two) times daily. 02/28/15   Peter M [place], MD  clopidogrel (PLAVIX) 75 MG tablet Take 1 tablet (75 mg total) by  mouth daily with breakfast. 03/05/15   Azalee Course, PA  docusate sodium (COLACE) 100 MG capsule Take 1 capsule (100 mg total) by mouth every 12 (twelve) hours. 11/10/14   Pricilla Loveless, MD  furosemide (LASIX) 20 MG tablet Take 2 tablets (40 mg total) by mouth daily. 03/01/15   Sharon Seller, NP  Insulin Glargine (LANTUS SOLOSTAR) 100 UNIT/ML Solostar Pen Inject 10 Units into the skin daily at 10 pm. 04/06/14   Devoria Albe, MD  insulin lispro (HUMALOG KWIKPEN) 100 UNIT/ML KiwkPen Inject into the skin See admin instructions. Per Sliding Scale 71-120, 0 units:  121-150, 2 units:  151-200, 3 units:  201-250, 5 units:  251-300, 8 units:  301-350, 11 units:  351-400, 15 units:  401+, 15 units CALL MD    Historical Provider, MD   isosorbide mononitrate (IMDUR) 60 MG 24 hr tablet Take 1 tablet (60 mg total) by mouth daily. 03/05/15   Azalee Course, PA  lisinopril (PRINIVIL,ZESTRIL) 10 MG tablet Take 1 tablet (10 mg total) by mouth daily. 03/01/15 02/29/16  Sharon Seller, NP  Multiple Vitamin (MULTIVITAMIN) tablet Take 1 tablet by mouth daily.      Historical Provider, MD  nitroGLYCERIN (NITROSTAT) 0.4 MG SL tablet Place 0.4 mg under the tongue every 5 (five) minutes as needed for chest pain (maxium 3 tablets per day).    Historical Provider, MD  ondansetron (ZOFRAN ODT) 4 MG disintegrating tablet Take 1 tablet (4 mg total) by mouth every 8 (eight) hours as needed for nausea or vomiting. 03/05/15   Azalee Course, PA  pantoprazole (PROTONIX) 40 MG tablet Take 1 tablet (40 mg total) by mouth daily before breakfast. 03/05/15   Azalee Course, PA  polyethylene glycol [facility]) packet Take 17 g by mouth daily as needed for mild constipation. 12/24/14   Shanker Levora Dredge, MD  potassium chloride SA (K-DUR,KLOR-CON) 20 MEQ tablet Take 1 tablet (20 mEq total) by mouth daily. 12/24/14   Shanker Levora Dredge, MD  pravastatin (PRAVACHOL) 40 MG tablet Take 1 tablet (40 mg total) by mouth daily. 10/19/14   Sharon Seller, NP  traMADol (ULTRAM) 50 MG tablet Take 1 tablet (50 mg total) by mouth every 6 (six) hours as needed. 12/24/14   Shanker Levora Dredge, MD   BP 117/69 mmHg  Pulse 68  Resp 38  Ht 5\' 6"  (1.676 m) Physical Exam  Constitutional: She appears well-developed and well-nourished.  HENT:  Head: Normocephalic and atraumatic.  Right Ear: External ear normal.  Left Ear: External ear normal.  Nose: Nose normal.  Eyes: Right eye exhibits no discharge. Left eye exhibits no discharge.  Pupils mid-sized, non-reactive  Neck: Neck supple.  Cardiovascular: Normal rate.   Pulses:      Carotid pulses are 2+ on the right side, and 2+ on the left side.      Femoral pulses are 2+ on the right side, and 2+ on the left side. Pulmonary/Chest:  Coarse breath  sounds bilaterally  Abdominal: She exhibits no distension.  Musculoskeletal:  IO in place  Neurological: She is unresponsive. GCS eye subscore is 1. GCS verbal subscore is 1. GCS motor subscore is 1.  Skin: Skin is warm and dry.  Nursing note and vitals reviewed.   ED Course  Procedures (including critical care time) Labs Review Labs Reviewed  COMPREHENSIVE METABOLIC PANEL - Abnormal; Notable for the following:    Potassium 5.3 (*)    CO2 13 (*)    Glucose, Bld 51 (*)  BUN 38 (*)    Creatinine, Ser 3.06 (*)    Calcium 8.4 (*)    Total Protein 5.9 (*)    Albumin 2.5 (*)    AST 2079 (*)    ALT 1196 (*)    Total Bilirubin 1.5 (*)    GFR calc non Af Amer 13 (*)    GFR calc Af Amer 16 (*)    Anion gap 25 (*)    All other components within normal limits  TROPONIN I - Abnormal; Notable for the following:    Troponin I 1.16 (*)    All other components within normal limits  CBC WITH DIFFERENTIAL/PLATELET - Abnormal; Notable for the following:    WBC 11.3 (*)    RBC 3.74 (*)    Hemoglobin 9.9 (*)    HCT 32.0 (*)    Platelets 52 (*)    All other components within normal limits  PROTIME-INR - Abnormal; Notable for the following:    Prothrombin Time 25.8 (*)    INR 2.39 (*)    All other components within normal limits  ETHANOL - Abnormal; Notable for the following:    Alcohol, Ethyl (B) 6 (*)    All other components within normal limits  BRAIN NATRIURETIC PEPTIDE - Abnormal; Notable for the following:    B Natriuretic Peptide 619.7 (*)    All other components within normal limits  I-STAT ARTERIAL BLOOD GAS, ED - Abnormal; Notable for the following:    pH, Arterial 6.998 (*)    pCO2 arterial 48.6 (*)    Bicarbonate 11.9 (*)    Acid-base deficit 18.0 (*)    All other components within normal limits  I-STAT TROPOININ, ED - Abnormal; Notable for the following:    Troponin i, poc 1.05 (*)    All other components within normal limits  I-STAT CG4 LACTIC ACID, ED - Abnormal;  Notable for the following:    Lactic Acid, Venous 14.12 (*)    All other components within normal limits  I-STAT CHEM 8, ED - Abnormal; Notable for the following:    Potassium 5.2 (*)    BUN 47 (*)    Creatinine, Ser 3.00 (*)    Glucose, Bld 43 (*)    Calcium, Ion 1.03 (*)    Hemoglobin 10.9 (*)    HCT 32.0 (*)    All other components within normal limits  URINE CULTURE  CULTURE, BLOOD (ROUTINE X 2)  CULTURE, BLOOD (ROUTINE X 2)  CULTURE, RESPIRATORY (NON-EXPECTORATED)  URINALYSIS, ROUTINE W REFLEX MICROSCOPIC (NOT AT [facility])  TROPONIN I  TROPONIN I  TROPONIN I  BASIC METABOLIC PANEL  BASIC METABOLIC PANEL  PROTIME-INR  APTT  BLOOD GAS, ARTERIAL  CBC  LACTIC ACID, PLASMA  CBG MONITORING, ED    Imaging Review Dg Chest Portable 1 View  06-04-2015  CLINICAL DATA:  Altered mental status/unresponsive.  Hypoxia. EXAM: PORTABLE CHEST 1 VIEW COMPARISON:  Study obtained earlier in the day FINDINGS: Endotracheal tube tip is 7 mm above the carina. Nasogastric tube tip and side port are in the stomach. Central catheter tip is in the superior vena cava. No pneumothorax. There is no edema or consolidation. Heart is upper normal in size with pulmonary vascularity within normal limits. There is atherosclerotic change in the aorta. IMPRESSION: Endotracheal tube tip is 7 mm above the carina. Suggest withdrawing endotracheal tube approximately 3 cm. No pneumothorax. No edema or consolidation. Electronically Signed   By: Bretta Bang III M.D.   On: 06-04-2015 15:10  Dg Chest Port 1 View  2015-06-19  CLINICAL DATA:  Post cardiac arrest, intubated EXAM: PORTABLE CHEST 1 VIEW COMPARISON:  03/04/2015 FINDINGS: Borderline cardiomegaly. Status post CABG. There is NG-tube coiled within proximal stomach endotracheal tube in place with tip 1.7 cm above the carina. There is no pneumothorax. No infiltrate or pulmonary edema. IMPRESSION: Endotracheal tube in place. No pneumothorax. NG tube coiled within  proximal stomach. No infiltrate or pulmonary edema. Status post CABG. Electronically Signed   By: Natasha Mead M.D.   On: June 19, 2015 13:12   I have personally reviewed and evaluated these images and lab results as part of my medical decision-making.   EKG Interpretation   Date/Time:  Tuesday Jun 19, 2015 12:21:33 EST Ventricular Rate:  52 PR Interval:  165 QRS Duration: 181 QT Interval:  527 QTC Calculation: 490 R Axis:   -105 Text Interpretation:  Ectopic atrial rhythm Atrial premature complex  Partial missing lead(s): V2 V3 V4 V5 V6 nonspecific ST changes changes  noted compared to Oct 2016 Confirmed by Criss Alvine  MD, Debra Colon [phone]) on  June 19, 2015 1:19:38 PM       EKG Interpretation  Date/Time:  Tuesday 06/19/2015 12:37:29 EST Ventricular Rate:  77 PR Interval:  175 QRS Duration: 118 QT Interval:  402 QTC Calculation: 455 R Axis:   -22 Text Interpretation:  Ectopic atrial rhythm Incomplete right bundle branch block Low voltage, precordial leads Abnormal T, consider ischemia, lateral leads Confirmed by Cena Bruhn  MD, Zella Dewan ([id number]) on 06-19-2015 1:20:37 PM        Cardiopulmonary Resuscitation (CPR) Procedure Note Directed/Performed by: Pricilla Loveless T I personally directed ancillary staff and/or performed CPR in an effort to regain return of spontaneous circulation and to maintain cardiac, neuro and systemic perfusion.   CRITICAL CARE Performed by: Pricilla Loveless T   Total critical care time: 75 minutes  Critical care time was exclusive of separately billable procedures and treating other patients.  Critical care was necessary to treat or prevent imminent or life-threatening deterioration.  Critical care was time spent personally by me on the following activities: development of treatment plan with patient and/or surrogate as well as nursing, discussions with consultants, evaluation of patient's response to treatment, examination of patient, obtaining history from  patient or surrogate, ordering and performing treatments and interventions, ordering and review of laboratory studies, ordering and review of radiographic studies, pulse oximetry and re-evaluation of patient's condition.  MDM   Final diagnoses:  Respiratory failure (HCC)    Patient presents in cardiac arrest. Very shortly after arrival the automated chest compression machine was removed and patient has bounding pulses. Blood pressure down trended and CPR was briefly resumed with returns for day circulation after 2 minutes of CPR. She was also given epinephrine in this time. Discussing with the son at the bedside, he relates that patient had multiple episodes of vomiting throughout the night and was not feeling well. She at one episode of bowel incontinence and likely more episodes of urinary incontinence. She was getting a sponge bath and then became unresponsive. No complaints of chest pain. She has an extensive cardiac history, and thus concern for MI is high. There is no STEMI on her EKG. Discussed with the power of attorney, the daughter, who with this time understands patient's poor prognosis and possibility of anoxic brain injury. At this time she wants patient to still be full code. Patient coded multiple times while in the emergency department but each time was able to get spontaneous circulation  returned. Critical care and cardiology and been consulted. Patient will be admitted to the ICU in critical condition.    Pricilla LovelessScott Dali Kraner, MD 2015/12/18 [phone]

## 2015-06-29 NOTE — Procedures (Signed)
Central Venous Catheter Insertion Procedure Note Mckenzie Williamson [id number] Mar 17, 1933  Procedure: Insertion of Central Venous Catheter Indications: Assessment of intravascular volume, Drug and/or fluid administration and Frequent blood sampling  Procedure Details Consent: Risks of procedure as well as the alternatives and risks of each were explained to the (patient/caregiver).  Consent for procedure obtained. Time Out: Verified patient identification, verified procedure, site/side was marked, verified correct patient position, special equipment/implants available, medications/allergies/relevent history reviewed, required imaging and test results available.  Performed  Maximum sterile technique was used including antiseptics, cap, gloves, gown, hand hygiene, mask and sheet. Skin prep: Chlorhexidine; local anesthetic administered. A antimicrobial bonded/coated triple lumen catheter was placed in the right internal jugular vein with ultrasound guidance using the Seldinger technique.  Evaluation Blood flow good Complications: No apparent complications Patient did tolerate procedure well. Chest X-ray ordered to verify placement.  CXR: normal.  Mckenzie Williamson 06/06/2015, 3:30 PM

## 2015-06-29 DEATH — deceased

## 2015-09-14 ENCOUNTER — Telehealth: Payer: Self-pay | Admitting: Nurse Practitioner

## 2015-09-14 NOTE — Telephone Encounter (Signed)
Patients daughter called, she called to discuss her mothers  reason for her death.  She stated her mother insurances burial  insurance claim was denied because of information in her Medical Records.  Daughter says she will contact patients  Cardiologist. Cdavis

## 2016-10-19 IMAGING — CT CT ABD-PELV W/ CM
3 of 10 series · 12 of 46 positions shown, 18 images · IV contrast (Omni 300)
Comparison: 04/07/2014 at [DATE]

CLINICAL DATA: Abnormal CT scan.  Abdominal cramping.

EXAM:
CT ABDOMEN AND PELVIS WITH CONTRAST
TECHNIQUE: Multidetector CT imaging of the abdomen and pelvis was performed
using the standard protocol following bolus administration of
intravenous contrast.
CONTRAST:  125mL OMNIPAQUE IOHEXOL 300 MG/ML  SOLN

[Series 6: abd/ pelvis 5.0 i30f 1 · axial · 0.84mm/px · z∈[-401,-71]mm · 8 of 86 slices shown, 13 images]
[im 10/86  soft-tissue]
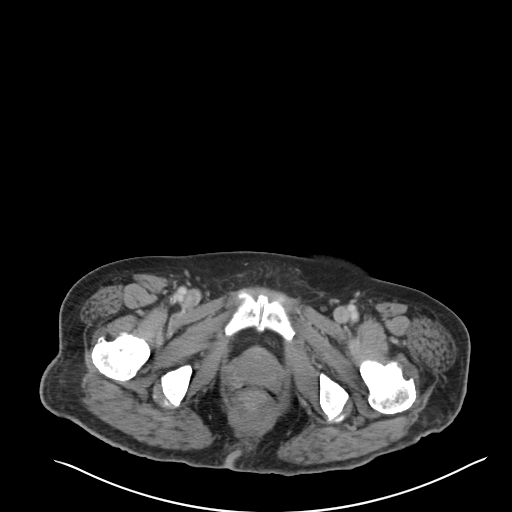
[im 10/86  bone]
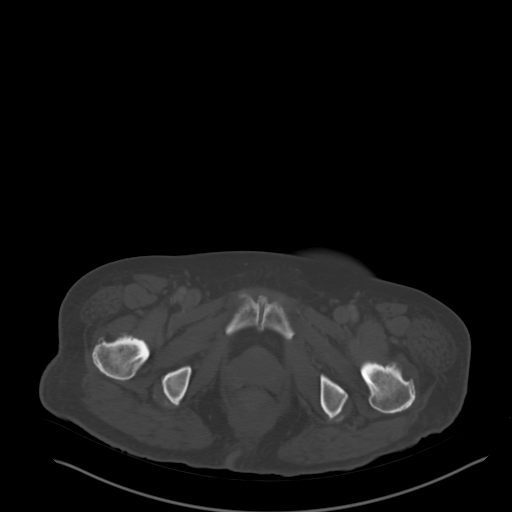
[im 19/86  soft-tissue]
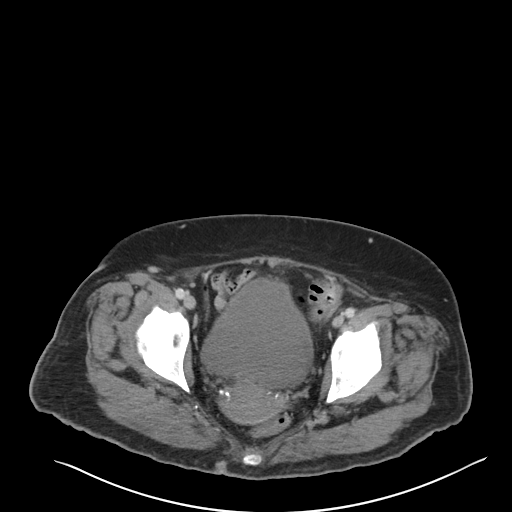
[im 29/86  soft-tissue]
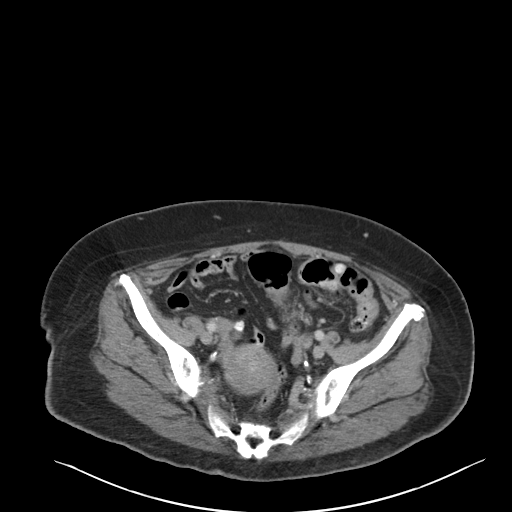
[im 38/86  soft-tissue]
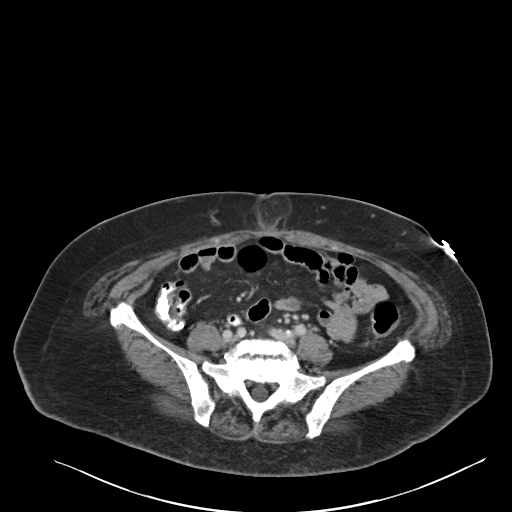
[im 48/86  soft-tissue]
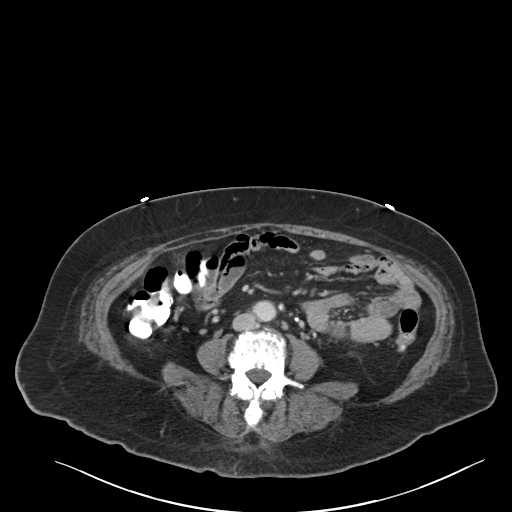
[im 48/86  lung]
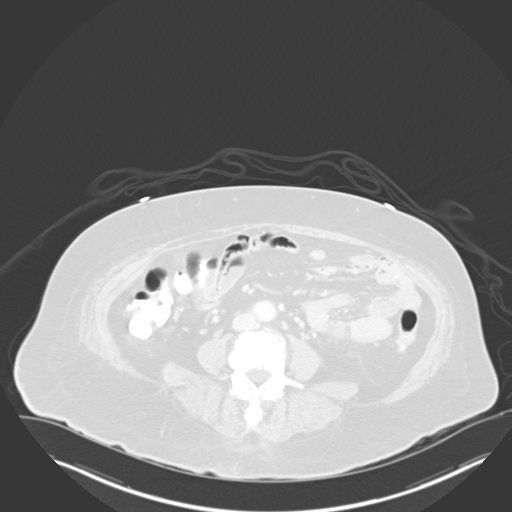
[im 57/86  soft-tissue]
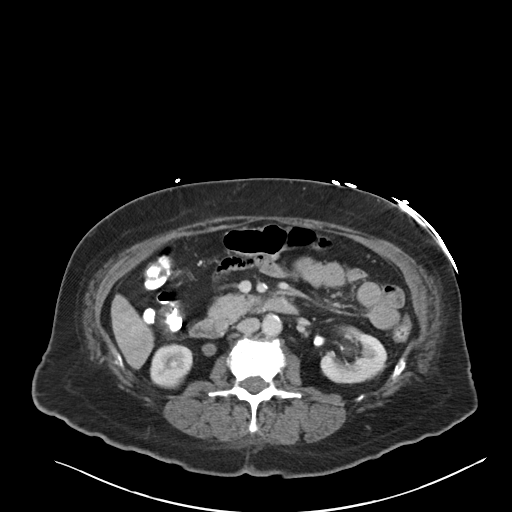
[im 57/86  lung]
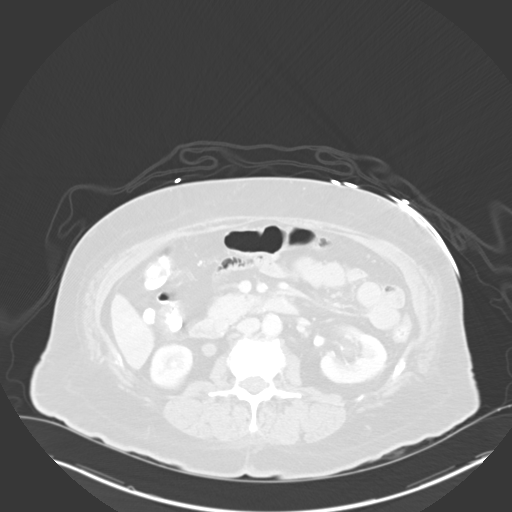
[im 67/86  soft-tissue]
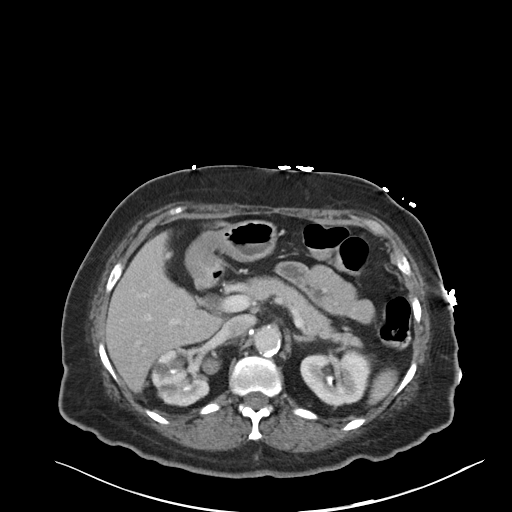
[im 67/86  lung]
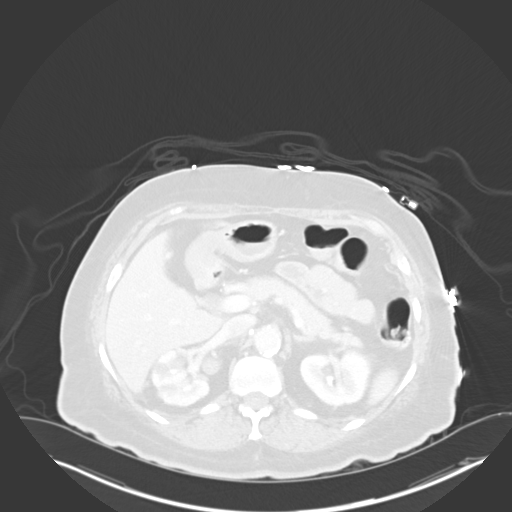
[im 76/86  soft-tissue]
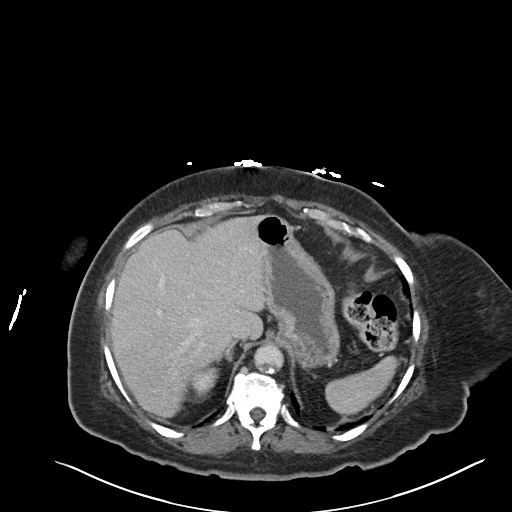
[im 76/86  lung]
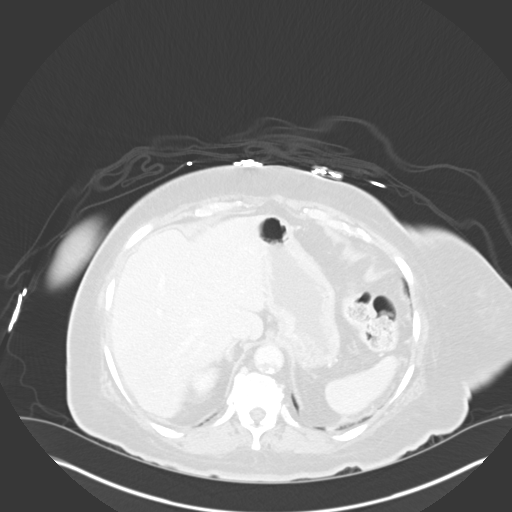

[Series 9: coronal · coronal · 0.70mm/px · 2 of 73 slices shown, 3 images]
[im 25/73  soft-tissue]
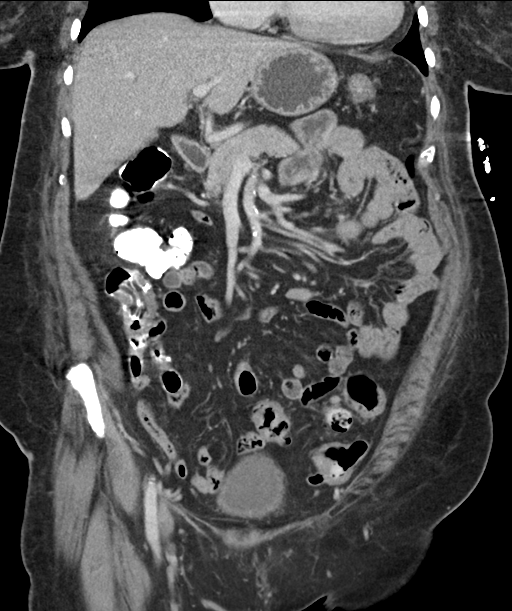
[im 25/73  bone]
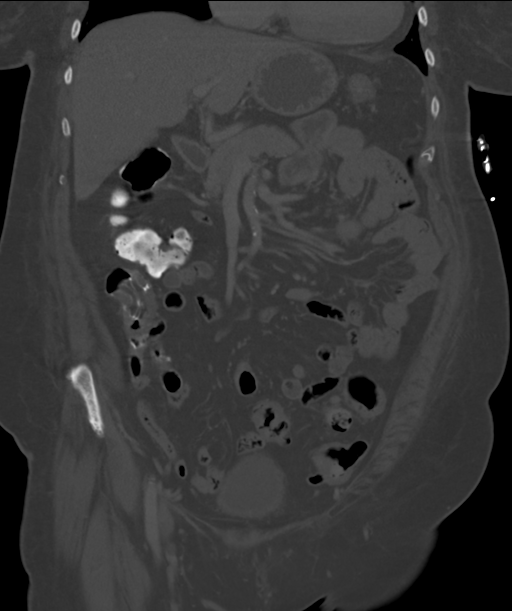
[im 49/73  soft-tissue]
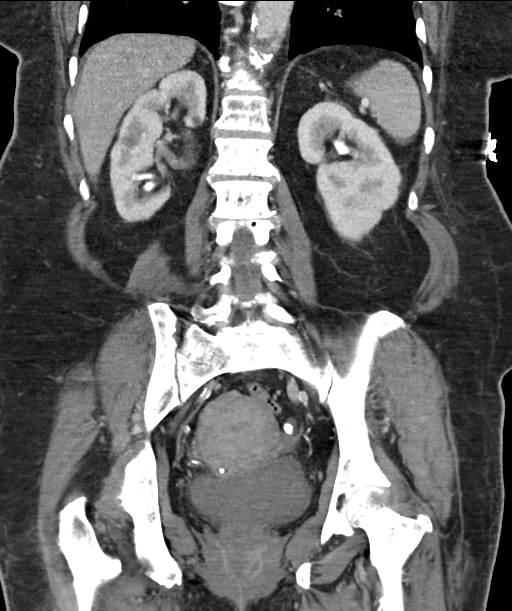

[Series 13: delays 5.0 i30f 1 · axial · 0.84mm/px · z∈[-252,-202]mm · 2 of 89 slices shown]
[im 10/89  soft-tissue]
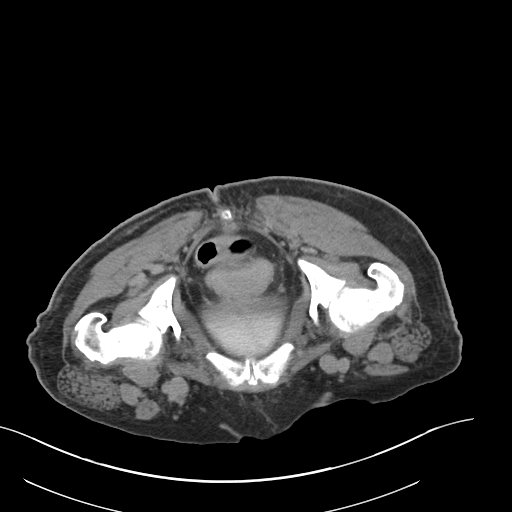
[im 20/89  soft-tissue]
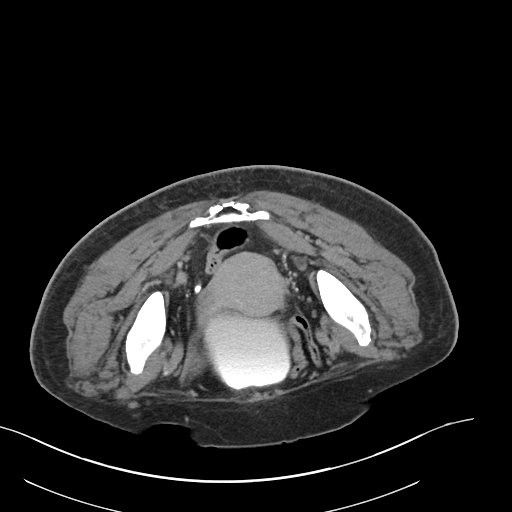

[12 of 46 positions shown; findings below may reference images not displayed]

FINDINGS: BODY WALL: Fatty umbilical hernia.

LOWER CHEST: Unremarkable.

ABDOMEN/PELVIS:

Liver: No focal abnormality.

Biliary: Cholecystectomy.  No ductal enlargement.

Pancreas: Unremarkable.

Spleen: Unremarkable.

Adrenals: Unremarkable.

Kidneys, ureters, bladder: Pre contrast imaging is suboptimal as
there is already contrast within the urinary collecting system in
this patient with difficulty obtaining reliable venous access. No
stones were seen on the previous study. The apparent high density
within the upper right urinary collecting system is not seen
precontrast. On corticomedullary phase there are bilateral cysts,
cortical on the right and hilar on the left. No solid enhancing
masses. Evaluation of urothelial enhancement is suboptimal due to
excreted contrast already present within the urinary collecting
system. No abnormal urothelial enhancement identified.

On excretory phase imaging there is no soft tissue filling defect to
suggest a urothelial lesion. No discrete debris identified either.
There is a graded appearance of the iodinated urine in the right
kidney, likely related to patulous collecting system. Caliceal
diverticulum is present within the right upper pole. Despite the
dilated right renal pelvis, no obstructive process is identified.
Opacified portions of the bladder show no abnormal urothelial
contour.

Reproductive: Unremarkable for age

Bowel: No obstruction. Negative appendix.

Retroperitoneum: No mass or adenopathy.

Peritoneum: No ascites or pneumoperitoneum.

Vascular: Diffuse atherosclerosis, with extensive irregular thrombus
on the posterior wall of the upper abdominal aorta.

OSSEOUS: Diffuse degenerative disease of the thoracic and lumbar
spine. No acute osseous findings.
IMPRESSION: 1. Negative for urothelial mass or other explanation for findings on
the preceding abdominal CT.
2. Right pelvicaliectasis without stone or other obstructive
process.

## 2017-07-01 NOTE — Telephone Encounter (Signed)
Deceased...note closed Mckenzie Williamson

## 2017-07-06 IMAGING — DX DG KNEE 1-2V*R*
2 series · 2 of 2 positions shown · non-contrast
Comparison: None.

CLINICAL DATA: Unable to straighten leg.  No injury.

EXAM:
RIGHT KNEE - 1-2 VIEW

[knee ap]
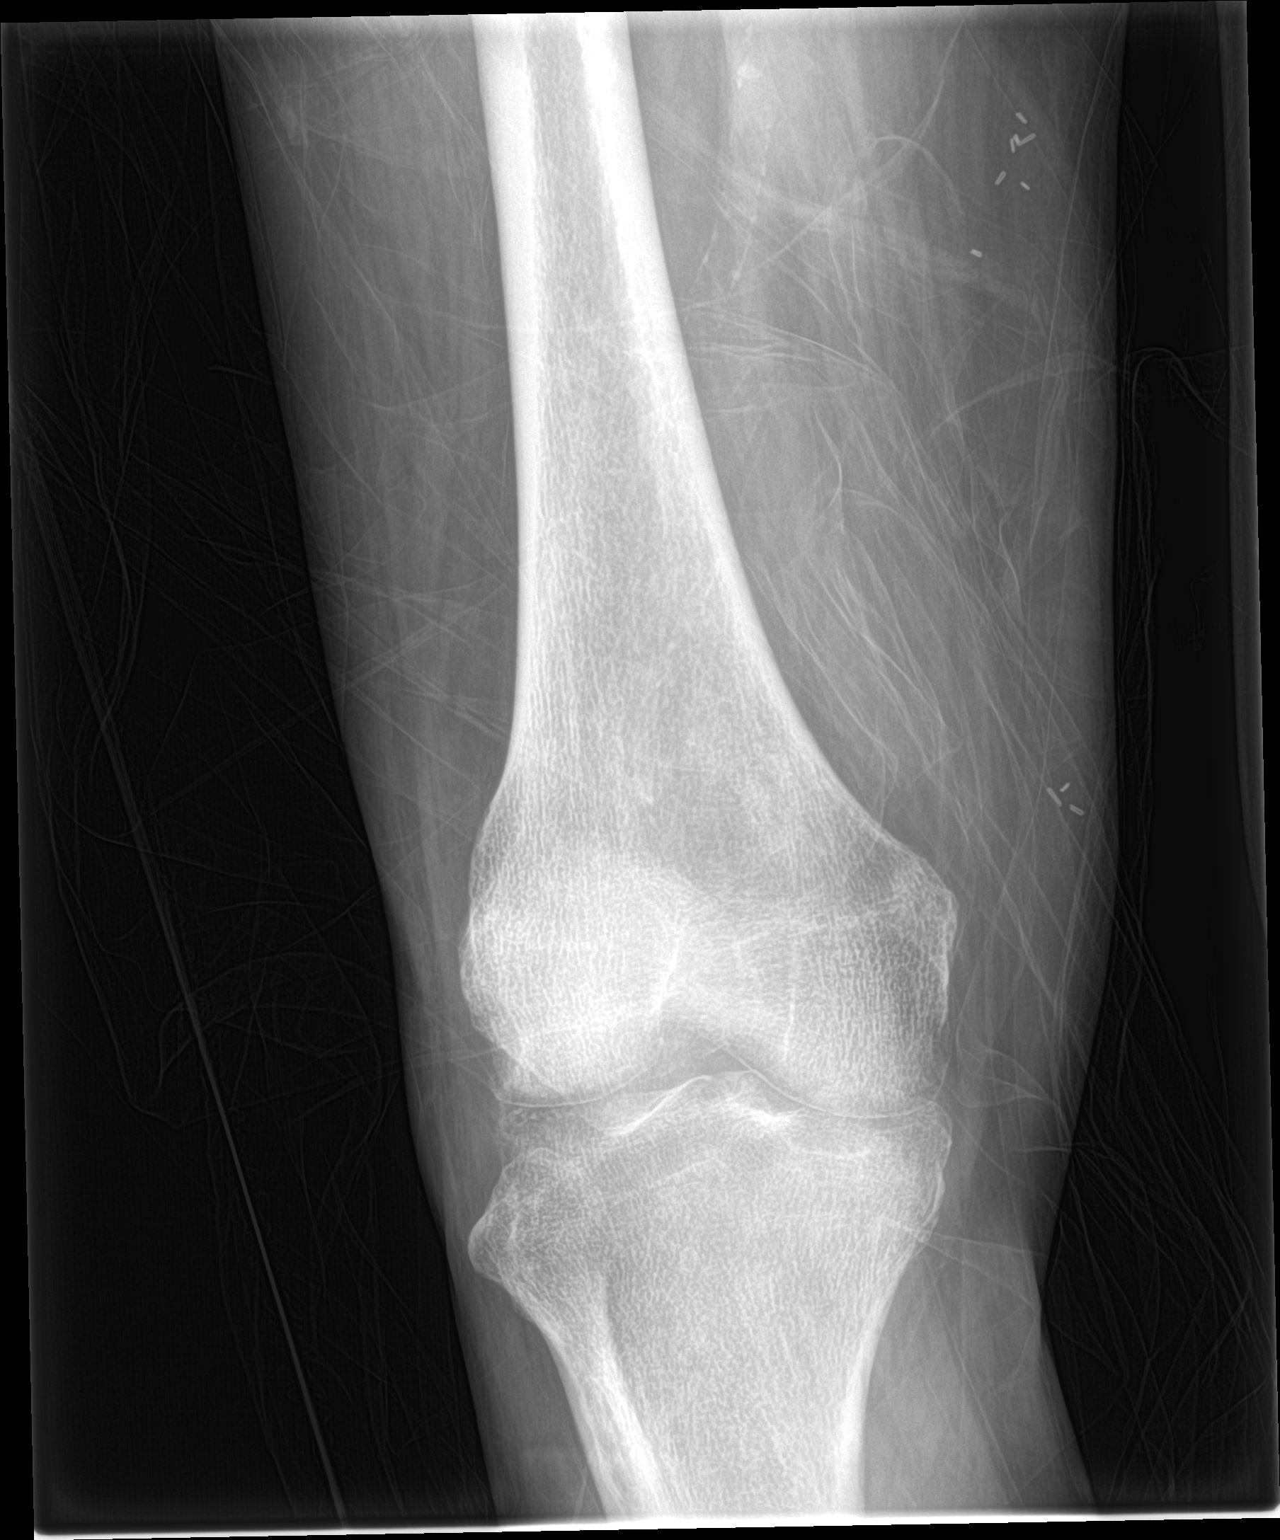

[knee lat]
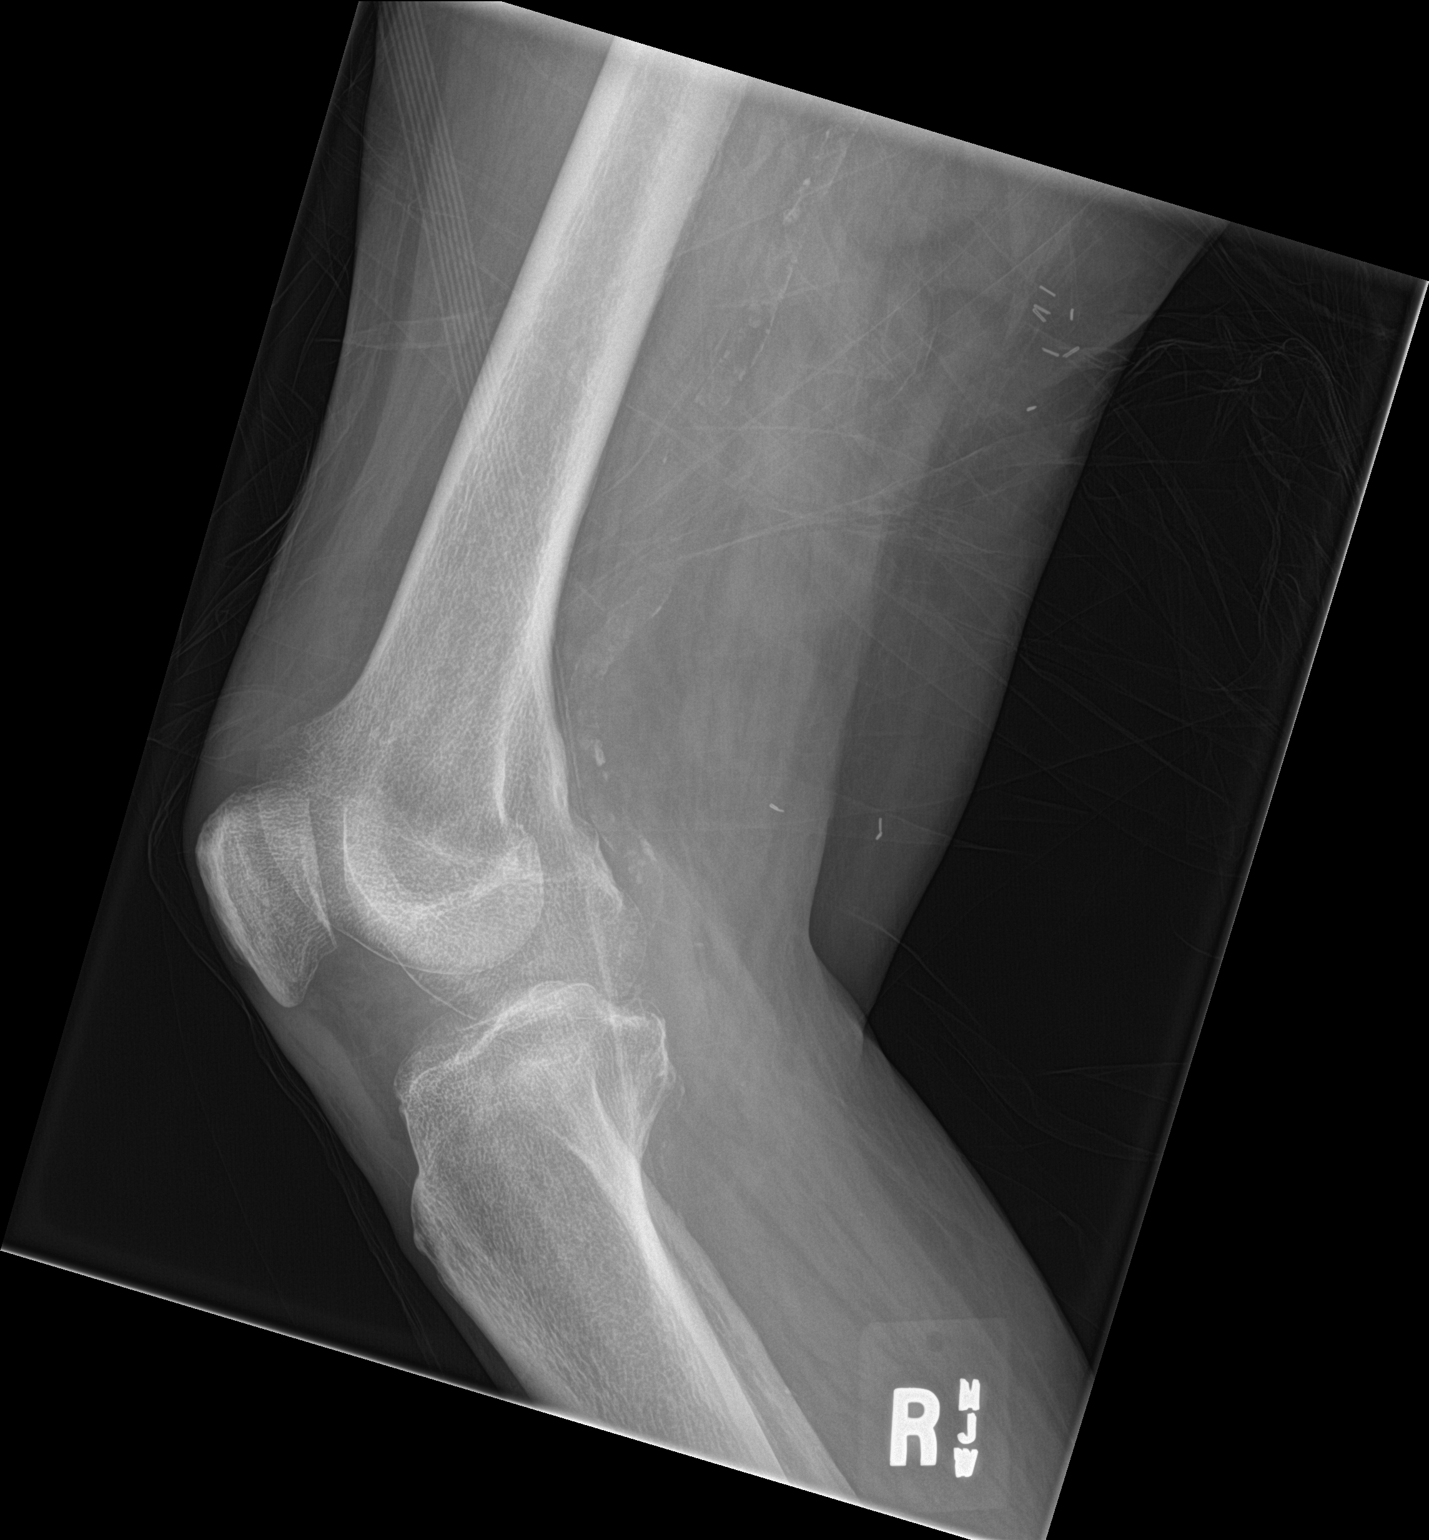

[2 of 2 positions shown; findings below may reference images not displayed]

FINDINGS: The knees slightly externally rotated. There is minimal decreased
bone mineralization. Minimal degenerative change present. No
evidence of fracture dislocation. No significant joint effusion.
Atherosclerotic disease is present. Several surgical clips over the
posterior soft tissues of the knee and upper leg.
IMPRESSION: No acute findings.
